# Patient Record
Sex: Male | Born: 1960 | Race: White | Hispanic: No | Marital: Single | Smoking: Current every day smoker
Health system: Southern US, Community
[De-identification: ages and names within clinical notes are randomized; demographics above are authoritative.]

## PROBLEM LIST (undated history)

## (undated) DIAGNOSIS — J449 Chronic obstructive pulmonary disease, unspecified: Secondary | ICD-10-CM

## (undated) DIAGNOSIS — K219 Gastro-esophageal reflux disease without esophagitis: Secondary | ICD-10-CM

## (undated) DIAGNOSIS — F319 Bipolar disorder, unspecified: Secondary | ICD-10-CM

## (undated) DIAGNOSIS — E079 Disorder of thyroid, unspecified: Secondary | ICD-10-CM

## (undated) DIAGNOSIS — F32A Depression, unspecified: Secondary | ICD-10-CM

## (undated) DIAGNOSIS — E119 Type 2 diabetes mellitus without complications: Secondary | ICD-10-CM

## (undated) DIAGNOSIS — F410 Panic disorder [episodic paroxysmal anxiety] without agoraphobia: Secondary | ICD-10-CM

## (undated) DIAGNOSIS — B182 Chronic viral hepatitis C: Secondary | ICD-10-CM

## (undated) DIAGNOSIS — G8929 Other chronic pain: Secondary | ICD-10-CM

## (undated) DIAGNOSIS — M549 Dorsalgia, unspecified: Secondary | ICD-10-CM

## (undated) DIAGNOSIS — F329 Major depressive disorder, single episode, unspecified: Secondary | ICD-10-CM

## (undated) DIAGNOSIS — N400 Enlarged prostate without lower urinary tract symptoms: Secondary | ICD-10-CM

## (undated) DIAGNOSIS — I10 Essential (primary) hypertension: Secondary | ICD-10-CM

## (undated) DIAGNOSIS — E785 Hyperlipidemia, unspecified: Secondary | ICD-10-CM

## (undated) HISTORY — DX: Panic disorder (episodic paroxysmal anxiety): F41.0

## (undated) HISTORY — DX: Benign prostatic hyperplasia without lower urinary tract symptoms: N40.0

## (undated) HISTORY — PX: OTHER SURGICAL HISTORY: SHX169

## (undated) HISTORY — PX: CATARACT EXTRACTION W/ INTRAOCULAR LENS IMPLANT: SHX1309

## (undated) HISTORY — DX: Essential (primary) hypertension: I10

## (undated) HISTORY — DX: Type 2 diabetes mellitus without complications: E11.9

## (undated) HISTORY — DX: Major depressive disorder, single episode, unspecified: F32.9

## (undated) HISTORY — PX: SCROTAL SURGERY: SHX2387

## (undated) HISTORY — DX: Hyperlipidemia, unspecified: E78.5

## (undated) HISTORY — DX: Gastro-esophageal reflux disease without esophagitis: K21.9

## (undated) HISTORY — DX: Chronic viral hepatitis C: B18.2

## (undated) HISTORY — PX: FACIAL RECONSTRUCTION SURGERY: SHX631

## (undated) HISTORY — DX: Depression, unspecified: F32.A

## (undated) HISTORY — DX: Chronic obstructive pulmonary disease, unspecified: J44.9

## (undated) HISTORY — DX: Disorder of thyroid, unspecified: E07.9

## (undated) HISTORY — PX: HERNIA REPAIR: SHX51

---

## 2016-09-27 ENCOUNTER — Encounter (INDEPENDENT_AMBULATORY_CARE_PROVIDER_SITE_OTHER): Payer: Self-pay

## 2016-09-27 ENCOUNTER — Ambulatory Visit (INDEPENDENT_AMBULATORY_CARE_PROVIDER_SITE_OTHER): Payer: Medicare Other | Admitting: Family Medicine

## 2016-09-27 ENCOUNTER — Encounter: Payer: Self-pay | Admitting: Family Medicine

## 2016-09-27 VITALS — BP 111/75 | HR 71 | Temp 97.0°F | Ht 72.0 in | Wt 216.0 lb

## 2016-09-27 DIAGNOSIS — F319 Bipolar disorder, unspecified: Secondary | ICD-10-CM | POA: Insufficient documentation

## 2016-09-27 DIAGNOSIS — E1159 Type 2 diabetes mellitus with other circulatory complications: Secondary | ICD-10-CM | POA: Insufficient documentation

## 2016-09-27 DIAGNOSIS — N400 Enlarged prostate without lower urinary tract symptoms: Secondary | ICD-10-CM | POA: Insufficient documentation

## 2016-09-27 DIAGNOSIS — Z794 Long term (current) use of insulin: Secondary | ICD-10-CM

## 2016-09-27 DIAGNOSIS — Z7689 Persons encountering health services in other specified circumstances: Secondary | ICD-10-CM

## 2016-09-27 DIAGNOSIS — F3177 Bipolar disorder, in partial remission, most recent episode mixed: Secondary | ICD-10-CM | POA: Diagnosis not present

## 2016-09-27 DIAGNOSIS — N401 Enlarged prostate with lower urinary tract symptoms: Secondary | ICD-10-CM | POA: Diagnosis not present

## 2016-09-27 DIAGNOSIS — R351 Nocturia: Secondary | ICD-10-CM

## 2016-09-27 DIAGNOSIS — E039 Hypothyroidism, unspecified: Secondary | ICD-10-CM | POA: Insufficient documentation

## 2016-09-27 DIAGNOSIS — F410 Panic disorder [episodic paroxysmal anxiety] without agoraphobia: Secondary | ICD-10-CM | POA: Diagnosis not present

## 2016-09-27 DIAGNOSIS — E118 Type 2 diabetes mellitus with unspecified complications: Secondary | ICD-10-CM

## 2016-09-27 DIAGNOSIS — I10 Essential (primary) hypertension: Secondary | ICD-10-CM

## 2016-09-27 DIAGNOSIS — E1140 Type 2 diabetes mellitus with diabetic neuropathy, unspecified: Secondary | ICD-10-CM

## 2016-09-27 LAB — BAYER DCA HB A1C WAIVED: HB A1C (BAYER DCA - WAIVED): 8.7 % — ABNORMAL HIGH (ref <7.0)

## 2016-09-27 MED ORDER — BLOOD GLUCOSE MONITORING SUPPL W/DEVICE KIT: PACK | 1 refills | Status: DC

## 2016-09-27 MED ORDER — GABAPENTIN 300 MG PO CAPS: 300.0000 mg | ORAL_CAPSULE | Freq: Two times a day (BID) | ORAL | 1 refills | Status: DC

## 2016-09-27 MED ORDER — ALPRAZOLAM 0.5 MG PO TABS: 0.5000 mg | ORAL_TABLET | Freq: Every day | ORAL | 0 refills | Status: DC | PRN

## 2016-09-27 MED ORDER — METFORMIN HCL 1000 MG PO TABS: 1000.0000 mg | ORAL_TABLET | Freq: Two times a day (BID) | ORAL | 1 refills | Status: DC

## 2016-09-27 MED ORDER — MIRTAZAPINE 45 MG PO TABS
45.0000 mg | ORAL_TABLET | Freq: Every day | ORAL | 1 refills | Status: DC
Start: 2016-09-27 — End: 2016-11-26

## 2016-09-27 MED ORDER — INSULIN NPH (HUMAN) (ISOPHANE) 100 UNIT/ML ~~LOC~~ SUSP: 25.0000 [IU] | Freq: Two times a day (BID) | SUBCUTANEOUS | 11 refills | Status: DC

## 2016-09-27 MED ORDER — "INSULIN SYRINGE 29G X 1/2"" 1 ML MISC": 11 refills | Status: DC

## 2016-09-27 MED ORDER — LEVOTHYROXINE SODIUM 50 MCG PO TABS: 50.0000 ug | ORAL_TABLET | Freq: Every day | ORAL | 1 refills | Status: DC

## 2016-09-27 MED ORDER — FLUOXETINE HCL 40 MG PO CAPS: 40.0000 mg | ORAL_CAPSULE | Freq: Every day | ORAL | 1 refills | Status: DC

## 2016-09-27 MED ORDER — TOPIRAMATE 200 MG PO TABS: 200.0000 mg | ORAL_TABLET | Freq: Every day | ORAL | 1 refills | Status: DC

## 2016-09-27 MED ORDER — GEMFIBROZIL 600 MG PO TABS: 600.0000 mg | ORAL_TABLET | Freq: Two times a day (BID) | ORAL | 1 refills | Status: DC

## 2016-09-27 MED ORDER — LOSARTAN POTASSIUM 50 MG PO TABS: 50.0000 mg | ORAL_TABLET | Freq: Every day | ORAL | 3 refills | Status: DC

## 2016-09-27 MED ORDER — INSULIN REGULAR HUMAN 100 UNIT/ML IJ SOLN: 0.0000 [IU] | Freq: Two times a day (BID) | INTRAMUSCULAR | 11 refills | Status: DC

## 2016-09-27 NOTE — Progress Notes (Signed)
Subjective:    Patient ID: Jacob Bautista, male    DOB: August 13, 1961, 56 y.o.   MRN: [id number]  HPI Pt here today to establish care and have medications refilled. He was recently released from being incarcerated.  Patient is living with his daughter and doing pretty well. He has type 2 diabetes, hypertension, hypothyroid, bipolar disorder, PTSD, BPH. He also has some issues with panic disorder usually precipitated by being in groups and large crowds. Does complain of some cough, nonproductive. Also complains of s I think he is generally doing pretty well. He does have some insight as to his problems and that makes a difference ome burning and pain in his feet. Was formally treated with gabapentin but that was discontinued while he was imprisoned.   There are no active problems to display for this patient.  Outpatient Encounter Prescriptions as of 09/27/2016  Medication Sig  . FLUoxetine (PROZAC) 40 MG capsule Take 40 mg by mouth daily.  [name] gemfibrozil (LOPID) 600 MG tablet Take 600 mg by mouth 2 (two) times daily before a meal.  . glipiZIDE (GLUCOTROL) 10 MG tablet Take 10 mg by mouth 2 (two) times daily before a meal.  . insulin NPH Human (HUMULIN N,NOVOLIN N) 100 UNIT/ML injection Inject 25 Units into the skin 2 (two) times daily before a meal.  . insulin regular (NOVOLIN R,HUMULIN R) 250 units/2.68m (100 units/mL) injection Inject into the skin 2 (two) times daily before a meal. Sliding scale  . levothyroxine (SYNTHROID, LEVOTHROID) 50 MCG tablet Take 50 mcg by mouth daily before breakfast.  . lisinopril (PRINIVIL,ZESTRIL) 5 MG tablet Take 5 mg by mouth daily.  . meloxicam (MOBIC) 7.5 MG tablet Take 7.5 mg by mouth 2 (two) times daily.  . metFORMIN (GLUCOPHAGE) 1000 MG tablet Take 1,000 mg by mouth 2 (two) times daily with a meal.  . mirtazapine (REMERON) 45 MG tablet Take 45 mg by mouth at bedtime.  . topiramate (TOPAMAX) 200 MG tablet Take 200 mg by mouth daily.   No  facility-administered encounter medications on file as of 09/27/2016.       Review of Systems  Constitutional: Negative.   HENT: Negative.   Eyes: Negative.   Respiratory: Positive for cough.   Cardiovascular: Negative.   Gastrointestinal: Positive for diarrhea and nausea.  Endocrine: Negative.   Genitourinary: Negative.   Musculoskeletal: Positive for arthralgias (right hip pain).  Skin: Negative.   Allergic/Immunologic: Negative.   Neurological: Negative.   Hematological: Negative.   Psychiatric/Behavioral: Positive for agitation. The patient is nervous/anxious.        Objective:   Physical Exam  Constitutional: He is oriented to person, place, and time. He appears well-developed and well-nourished.  HENT:  Mouth/Throat: Oropharynx is clear and moist.  Eyes: Pupils are equal, round, and reactive to light.  Cardiovascular: Normal rate, regular rhythm, normal heart sounds and intact distal pulses.   Pulmonary/Chest: Effort normal. He has wheezes.  Abdominal: Soft. There is no tenderness.  Neurological: He is alert and oriented to person, place, and time.  Psychiatric: He has a normal mood and affect. His behavior is normal. Thought content normal.    BP 111/75 (BP Location: Left Arm)   Pulse 71   Temp 97 F (36.1 C) (Oral)   Ht 6' (1.829 m)   Wt 216 lb (98 kg)   BMI 29.29 kg/m        Assessment & Plan:  1. Encounter to establish care We'll do some baseline labs and will  make some small changes and additions to medicine - POCT glycosylated hemoglobin (Hb A1C) - CMP14+EGFR - Lipid panel  2. Type 2 diabetes mellitus with complication, with long-term current use of insulin (HCC) Stop glipizide since he has some lows. We discussed sliding scale, importance of diet. Last A1c was 7.5 - POCT glycosylated hemoglobin (Hb A1C) - INSULIN SYRINGE 1CC/29G (B-D INSULIN SYRINGE) 29G X 1/2" 1 ML MISC; Use with insulin up to QID PRN  Dispense: 300 each; Refill: 11 - Hgb A1c w/o  eAG  3. Essential hypertension Change lisinopril to losartan. Patient is having cough which may be related - CMP14+EGFR  4. Controlled type 2 diabetes with neuropathy ([place]) Continue with NPH, regular insulin, and metformin. Discontinued glipizide due to hypoglycemia. And gabapentin 300 mg twice a day for diabetic neuropathy    6. Hypothyroidism, unspecified type Last thyroid panel was normal. Continue same dose of replacement  7. Bipolar disorder, in partial remission, most recent episode mixed ([place]) Taking mood stabilizer and antidepressant. Problem seems to be in remission  8. Benign prostatic hyperplasia with nocturia Does not want any medicine. Problem is minor at this time. Last PSA done 2 years ago was 0.4  9. Panic disordser Add alprazolam 0.5 mg #30. I believe patient can manage his symptoms taking this when necessary and he agrees  Wardell Honour MD

## 2016-09-27 NOTE — Addendum Note (Signed)
Addended by: Magdalene RiverBULLINS, Mickie Kozikowski H on: 09/27/2016 05:08 PM   Modules accepted: Orders

## 2016-09-28 LAB — LIPID PANEL
Chol/HDL Ratio: 5.2 ratio units — ABNORMAL HIGH (ref 0.0–5.0)
Cholesterol, Total: 165 mg/dL (ref 100–199)
HDL: 32 mg/dL — AB (ref >39)
LDL Calculated: 89 mg/dL (ref 0–99)
Triglycerides: 218 mg/dL — ABNORMAL HIGH (ref 0–149)
VLDL CHOLESTEROL CAL: 44 mg/dL — AB (ref 5–40)

## 2016-09-28 LAB — CMP14+EGFR
A/G RATIO: 1.8 (ref 1.2–2.2)
ALK PHOS: 71 IU/L (ref 39–117)
ALT: 26 IU/L (ref 0–44)
AST: 32 IU/L (ref 0–40)
Albumin: 4.8 g/dL (ref 3.5–5.5)
BILIRUBIN TOTAL: 0.7 mg/dL (ref 0.0–1.2)
BUN/Creatinine Ratio: 18 (ref 9–20)
BUN: 17 mg/dL (ref 6–24)
CHLORIDE: 97 mmol/L (ref 96–106)
CO2: 20 mmol/L (ref 18–29)
Calcium: 9.9 mg/dL (ref 8.7–10.2)
Creatinine, Ser: 0.94 mg/dL (ref 0.76–1.27)
GFR calc non Af Amer: 91 mL/min/{1.73_m2} (ref >59)
GFR, EST AFRICAN AMERICAN: 105 mL/min/{1.73_m2} (ref >59)
GLUCOSE: 275 mg/dL — AB (ref 65–99)
Globulin, Total: 2.7 g/dL (ref 1.5–4.5)
POTASSIUM: 4.5 mmol/L (ref 3.5–5.2)
Sodium: 135 mmol/L (ref 134–144)
TOTAL PROTEIN: 7.5 g/dL (ref 6.0–8.5)

## 2016-10-13 ENCOUNTER — Emergency Department (HOSPITAL_COMMUNITY): Payer: Medicare Other

## 2016-10-13 ENCOUNTER — Inpatient Hospital Stay (HOSPITAL_COMMUNITY)
Admission: EM | Admit: 2016-10-13 | Discharge: 2016-10-15 | DRG: 683 | Disposition: A | Discharge status: Home / Self Care | Payer: Medicare Other | Attending: Internal Medicine | Admitting: Internal Medicine

## 2016-10-13 ENCOUNTER — Encounter (HOSPITAL_COMMUNITY): Payer: Self-pay

## 2016-10-13 DIAGNOSIS — F17210 Nicotine dependence, cigarettes, uncomplicated: Secondary | ICD-10-CM | POA: Diagnosis present

## 2016-10-13 DIAGNOSIS — E1159 Type 2 diabetes mellitus with other circulatory complications: Secondary | ICD-10-CM | POA: Diagnosis present

## 2016-10-13 DIAGNOSIS — F1210 Cannabis abuse, uncomplicated: Secondary | ICD-10-CM | POA: Diagnosis present

## 2016-10-13 DIAGNOSIS — Z8249 Family history of ischemic heart disease and other diseases of the circulatory system: Secondary | ICD-10-CM

## 2016-10-13 DIAGNOSIS — E1165 Type 2 diabetes mellitus with hyperglycemia: Secondary | ICD-10-CM

## 2016-10-13 DIAGNOSIS — E872 Acidosis, unspecified: Secondary | ICD-10-CM

## 2016-10-13 DIAGNOSIS — R32 Unspecified urinary incontinence: Secondary | ICD-10-CM | POA: Diagnosis present

## 2016-10-13 DIAGNOSIS — F410 Panic disorder [episodic paroxysmal anxiety] without agoraphobia: Secondary | ICD-10-CM | POA: Diagnosis present

## 2016-10-13 DIAGNOSIS — E869 Volume depletion, unspecified: Secondary | ICD-10-CM | POA: Diagnosis present

## 2016-10-13 DIAGNOSIS — N179 Acute kidney failure, unspecified: Secondary | ICD-10-CM | POA: Diagnosis not present

## 2016-10-13 DIAGNOSIS — R55 Syncope and collapse: Secondary | ICD-10-CM | POA: Diagnosis not present

## 2016-10-13 DIAGNOSIS — Z79899 Other long term (current) drug therapy: Secondary | ICD-10-CM

## 2016-10-13 DIAGNOSIS — Z794 Long term (current) use of insulin: Secondary | ICD-10-CM

## 2016-10-13 DIAGNOSIS — R81 Glycosuria: Secondary | ICD-10-CM | POA: Diagnosis present

## 2016-10-13 DIAGNOSIS — Z7982 Long term (current) use of aspirin: Secondary | ICD-10-CM

## 2016-10-13 DIAGNOSIS — I10 Essential (primary) hypertension: Secondary | ICD-10-CM | POA: Diagnosis present

## 2016-10-13 DIAGNOSIS — F319 Bipolar disorder, unspecified: Secondary | ICD-10-CM | POA: Diagnosis present

## 2016-10-13 DIAGNOSIS — F4310 Post-traumatic stress disorder, unspecified: Secondary | ICD-10-CM | POA: Diagnosis present

## 2016-10-13 DIAGNOSIS — N400 Enlarged prostate without lower urinary tract symptoms: Secondary | ICD-10-CM | POA: Diagnosis present

## 2016-10-13 DIAGNOSIS — Z833 Family history of diabetes mellitus: Secondary | ICD-10-CM

## 2016-10-13 DIAGNOSIS — I959 Hypotension, unspecified: Secondary | ICD-10-CM | POA: Diagnosis present

## 2016-10-13 DIAGNOSIS — Z9111 Patient's noncompliance with dietary regimen: Secondary | ICD-10-CM

## 2016-10-13 DIAGNOSIS — Z9114 Patient's other noncompliance with medication regimen: Secondary | ICD-10-CM

## 2016-10-13 DIAGNOSIS — E039 Hypothyroidism, unspecified: Secondary | ICD-10-CM | POA: Diagnosis present

## 2016-10-13 DIAGNOSIS — E1140 Type 2 diabetes mellitus with diabetic neuropathy, unspecified: Secondary | ICD-10-CM | POA: Diagnosis present

## 2016-10-13 LAB — COMPREHENSIVE METABOLIC PANEL
ALT: 23 U/L (ref 17–63)
AST: 37 U/L (ref 15–41)
Albumin: 3.8 g/dL (ref 3.5–5.0)
Alkaline Phosphatase: 60 U/L (ref 38–126)
Anion gap: 14 (ref 5–15)
BUN: 20 mg/dL (ref 6–20)
CHLORIDE: 99 mmol/L — AB (ref 101–111)
CO2: 24 mmol/L (ref 22–32)
CREATININE: 1.81 mg/dL — AB (ref 0.61–1.24)
Calcium: 9.7 mg/dL (ref 8.9–10.3)
GFR calc non Af Amer: 40 mL/min — ABNORMAL LOW (ref >60)
GFR, EST AFRICAN AMERICAN: 47 mL/min — AB (ref >60)
Glucose, Bld: 273 mg/dL — ABNORMAL HIGH (ref 65–99)
Potassium: 3.3 mmol/L — ABNORMAL LOW (ref 3.5–5.1)
SODIUM: 137 mmol/L (ref 135–145)
Total Bilirubin: 0.7 mg/dL (ref 0.3–1.2)
Total Protein: 6.9 g/dL (ref 6.5–8.1)

## 2016-10-13 LAB — CBC WITH DIFFERENTIAL/PLATELET
BASOS ABS: 0 10*3/uL (ref 0.0–0.1)
BASOS PCT: 0 %
EOS ABS: 0.3 10*3/uL (ref 0.0–0.7)
EOS PCT: 3 %
HCT: 38.8 % — ABNORMAL LOW (ref 39.0–52.0)
Hemoglobin: 13.5 g/dL (ref 13.0–17.0)
Lymphocytes Relative: 36 %
Lymphs Abs: 3.2 10*3/uL (ref 0.7–4.0)
MCH: 31.7 pg (ref 26.0–34.0)
MCHC: 34.8 g/dL (ref 30.0–36.0)
MCV: 91.1 fL (ref 78.0–100.0)
Monocytes Absolute: 0.7 10*3/uL (ref 0.1–1.0)
Monocytes Relative: 8 %
Neutro Abs: 4.9 10*3/uL (ref 1.7–7.7)
Neutrophils Relative %: 53 %
PLATELETS: 231 10*3/uL (ref 150–400)
RBC: 4.26 MIL/uL (ref 4.22–5.81)
RDW: 13.1 % (ref 11.5–15.5)
WBC: 9 10*3/uL (ref 4.0–10.5)

## 2016-10-13 LAB — I-STAT CHEM 8, ED
BUN: 18 mg/dL (ref 6–20)
CHLORIDE: 97 mmol/L — AB (ref 101–111)
Calcium, Ion: 1.18 mmol/L (ref 1.15–1.40)
Creatinine, Ser: 1.7 mg/dL — ABNORMAL HIGH (ref 0.61–1.24)
GLUCOSE: 269 mg/dL — AB (ref 65–99)
HCT: 40 % (ref 39.0–52.0)
Hemoglobin: 13.6 g/dL (ref 13.0–17.0)
POTASSIUM: 3.3 mmol/L — AB (ref 3.5–5.1)
Sodium: 137 mmol/L (ref 135–145)
TCO2: 25 mmol/L (ref 0–100)

## 2016-10-13 LAB — I-STAT TROPONIN, ED: Troponin i, poc: 0.01 ng/mL (ref 0.00–0.08)

## 2016-10-13 MED ORDER — SODIUM CHLORIDE 0.9 % IV BOLUS (SEPSIS)
1000.0000 mL | Freq: Once | INTRAVENOUS | Status: AC
  Administered 2016-10-13: 1000 mL via INTRAVENOUS

## 2016-10-13 MED ORDER — IOPAMIDOL (ISOVUE-370) INJECTION 76%
75.0000 mL | Freq: Once | INTRAVENOUS | Status: AC | PRN
  Administered 2016-10-14: 75 mL via INTRAVENOUS

## 2016-10-13 NOTE — ED Notes (Signed)
Two attempts at second iv by previous RN without success, two attempts by this Rn without success for second iv as well,

## 2016-10-13 NOTE — ED Notes (Signed)
Pt and family updated on plan of care,  

## 2016-10-13 NOTE — ED Notes (Signed)
Pt room air pulse ox 88-89 %, pt placed on oxygen at 2 lpm via [place] with improvement of pulse ox to 92-93%,

## 2016-10-13 NOTE — ED Provider Notes (Signed)
[facility] DEPT Provider Note   CSN: [id number] Arrival date & time: 10/13/16  2145  By signing my name below, I, Higinio Plan, attest that this documentation has been prepared under the direction and in the presence of Forde Dandy, MD . Electronically Signed: Higinio Plan, Scribe. 10/13/2016. 10:30 PM.  History   Chief Complaint Chief Complaint  Patient presents with  . Altered Mental Status   The history is provided by the patient and a relative. No language interpreter was used.   HPI Comments: Dewell Monnier. Brabec is a 56 y.o. male with PMHx of DM, COPD, HTN, and BPH, who presents to the Emergency Department for an evaluation s/p 2 syncopal episodes that occurred this evening. Pt reports he was sitting on the couch during his first episode and then on the commode during his second episode in which he fell off the commode, causing him to strike his head. Per daughter, pt lost consciousness for ~5 minutes during both episodes and was "confused for a couple of minutes after he awoke." She denies any seizure-like activity and states he is at his baseline now in the ED  . Pt's daughter states pt recently visited his PCP on 09/27/16 in which his medications were changed, but he cannot recall which medications were modified at this time. He notes he is compliant with all of his medications. He denies any abdominal pain, abdominal distention, vomiting, diarrhea, fever, cough, congestion, rhinorrhea, dysuria, urinary frequency, blood in his stool, melena, decreased appetite, chest pain, and shortness of breath.   Past Medical History:  Diagnosis Date  . BPH (benign prostatic hyperplasia)   . Depression    PTSD  . Diabetes mellitus without complication ([place])   . Hypertension   . Thyroid disease     Patient Active Problem List   Diagnosis Date Noted  . Controlled type 2 diabetes with neuropathy ([place]) 09/27/2016  . Essential hypertension, benign 09/27/2016  . Hypothyroidism 09/27/2016  . Bipolar  disorder ([place]) 09/27/2016  . BPH (benign prostatic hyperplasia) 09/27/2016  . Panic disorder 09/27/2016    Past Surgical History:  Procedure Laterality Date  . FACIAL RECONSTRUCTION SURGERY    . HERNIA REPAIR     x 2  . left hand surg    . SCROTAL SURGERY      Home Medications    Prior to Admission medications   Medication Sig Start Date End Date Taking? Authorizing Provider  albuterol (PROVENTIL HFA;VENTOLIN HFA) 108 (90 Base) MCG/ACT inhaler Inhale 1-2 puffs into the lungs every 6 (six) hours as needed for wheezing or shortness of breath.   Yes Historical Provider, MD  ALPRAZolam Duanne Moron) 0.5 MG tablet Take 1 tablet (0.5 mg total) by mouth daily as needed for anxiety. 09/27/16  Yes Wardell Honour, MD  aspirin EC 81 MG tablet Take 81 mg by mouth daily.   Yes Historical Provider, MD  FLUoxetine (PROZAC) 40 MG capsule Take 1 capsule (40 mg total) by mouth daily. 09/27/16  Yes Wardell Honour, MD  gabapentin (NEURONTIN) 300 MG capsule Take 1 capsule (300 mg total) by mouth 2 (two) times daily. 09/27/16  Yes Wardell Honour, MD  gemfibrozil (LOPID) 600 MG tablet Take 1 tablet (600 mg total) by mouth 2 (two) times daily before a meal. 09/27/16  Yes Wardell Honour, MD  insulin NPH Human (HUMULIN N,NOVOLIN N) 100 UNIT/ML injection Inject 0.25 mLs (25 Units total) into the skin 2 (two) times daily before a meal. 09/27/16  Yes Lillette Boxer  Sabra Heck, MD  insulin regular (NOVOLIN R,HUMULIN R) 250 units/2.74m (100 units/mL) injection Inject 0-0.1 mLs (0-10 Units total) into the skin 2 (two) times daily before a meal. Sliding scale 09/27/16  Yes SWardell Honour MD  levothyroxine (SYNTHROID, LEVOTHROID) 50 MCG tablet Take 1 tablet (50 mcg total) by mouth daily before breakfast. 09/27/16  Yes SWardell Honour MD  losartan (COZAAR) 50 MG tablet Take 1 tablet (50 mg total) by mouth daily. 09/27/16  Yes SWardell Honour MD  metFORMIN (GLUCOPHAGE) 1000 MG tablet Take 1 tablet (1,000 mg total) by mouth 2 (two)  times daily with a meal. 09/27/16  Yes SWardell Honour MD  mirtazapine (REMERON) 45 MG tablet Take 1 tablet (45 mg total) by mouth at bedtime. 09/27/16  Yes SWardell Honour MD  Blood Glucose Monitoring Suppl w/Device KIT Test BS BID and PRN E11.9 09/27/16   SWardell Honour MD  INSULIN SYRINGE 1CC/29G (B-D INSULIN SYRINGE) 29G X 1/2" 1 ML MISC Use with insulin up to QID PRN 09/27/16   SWardell Honour MD  topiramate (TOPAMAX) 200 MG tablet Take 1 tablet (200 mg total) by mouth daily. Patient not taking: Reported on 10/13/2016 09/27/16   SWardell Honour MD    Family History Family History  Problem Relation Age of Onset  . Heart disease Father   . Diabetes Father   . Hypertension Father   . Stroke Father   . Stroke Brother     Social History Social History  Substance Use Topics  . Smoking status: Current Every Day Smoker    Types: Cigarettes  . Smokeless tobacco: Current User  . Alcohol use Yes     Comment: social   Allergies   Patient has no known allergies.  Review of Systems Review of Systems  Constitutional: Negative for appetite change and fever.  HENT: Negative for congestion.   Respiratory: Negative for shortness of breath.   Cardiovascular: Negative for chest pain.  Gastrointestinal: Negative for abdominal distention, abdominal pain, blood in stool, diarrhea, nausea and vomiting.  Genitourinary: Negative for dysuria and frequency.  All other systems reviewed and are negative.  Physical Exam Updated Vital Signs BP (!) 77/51 (BP Location: Left Arm)   Pulse 74   Temp 97.6 F (36.4 C) (Oral)   Resp 16   Ht 6' (1.829 m)   Wt 217 lb (98.4 kg)   SpO2 94%   BMI 29.43 kg/m   Physical Exam Physical Exam  Nursing note and vitals reviewed. Constitutional: appears pale, low energy, fatigued Head: Normocephalic and atraumatic.  Mouth/Throat: Oropharynx is clear and dry mucous membranes.  Neck: Normal range of motion. Neck supple.  Cardiovascular: Normal rate and  regular rhythm. No edema. +2 symmetric DP and radial pulses bilaterally  Pulmonary/Chest: Effort normal and breath sounds normal.  Abdominal: Soft. There is no tenderness. There is no rebound and no guarding.  Musculoskeletal: Normal range of motion.  Neurological: Alert, no facial droop, fluent speech, moves all extremities symmetrically Skin: Skin is warm and dry.  Psychiatric: Cooperative  ED Treatments / Results  DIAGNOSTIC STUDIES:  Oxygen Saturation is 94% on RA, normal by my interpretation.    COORDINATION OF CARE:  10:10 PM Discussed treatment plan with pt and his daughter at bedside and they agreed to plan.  Labs (all labs ordered are listed, but only abnormal results are displayed) Labs Reviewed  CBC WITH DIFFERENTIAL/PLATELET - Abnormal; Notable for the following:       Result Value  HCT 38.8 (*)    All other components within normal limits  COMPREHENSIVE METABOLIC PANEL - Abnormal; Notable for the following:    Potassium 3.3 (*)    Chloride 99 (*)    Glucose, Bld 273 (*)    Creatinine, Ser 1.81 (*)    GFR calc non Af Amer 40 (*)    GFR calc Af Amer 47 (*)    All other components within normal limits  I-STAT CHEM 8, ED - Abnormal; Notable for the following:    Potassium 3.3 (*)    Chloride 97 (*)    Creatinine, Ser 1.70 (*)    Glucose, Bld 269 (*)    All other components within normal limits  CULTURE, BLOOD (ROUTINE X 2)  CULTURE, BLOOD (ROUTINE X 2)  URINE CULTURE  URINALYSIS, ROUTINE W REFLEX MICROSCOPIC  LACTIC ACID, PLASMA  LACTIC ACID, PLASMA  CORTISOL  I-STAT TROPOININ, ED    EKG  EKG Interpretation  Date/Time:  Sunday October 13 2016 21:55:26 EST Ventricular Rate:  75 PR Interval:    QRS Duration: 127 QT Interval:  459 QTC Calculation: 513 R Axis:   68 Text Interpretation:  Sinus rhythm Nonspecific intraventricular conduction delay no prior EKG  Confirmed by LIU MD, DANA [phone]) on 10/13/2016 10:33:57 PM       Radiology Dg Chest  Portable 1 View  Result Date: 10/13/2016 CLINICAL DATA:  Two syncopal episodes today, fell off toilet and hit head. History of hypertension and diabetes. EXAM: PORTABLE CHEST 1 VIEW COMPARISON:  None. FINDINGS: Cardiac silhouette is mildly enlarged, silhouette is unremarkable for this low inspiratory examination with crowded vasculature markings. The lungs are clear without pleural effusions or focal consolidations. Trachea projects midline and there is no pneumothorax. Included soft tissue planes and osseous structures are non-suspicious. Possible loose body projecting over LEFT clavicle. IMPRESSION: Mild cardiomegaly, no acute pulmonary process in this low inspiratory portable examination. Electronically Signed   By: Elon Alas M.D.   On: 10/13/2016 22:28    Procedures Procedures (including critical care time) CRITICAL CARE Performed by: Forde Dandy   Total critical care time: 45 minutes  Critical care time was exclusive of separately billable procedures and treating other patients.  Critical care was necessary to treat or prevent imminent or life-threatening deterioration.  Critical care was time spent personally by me on the following activities: development of treatment plan with patient and/or surrogate as well as nursing, discussions with consultants, evaluation of patient's response to treatment, examination of patient, obtaining history from patient or surrogate, ordering and performing treatments and interventions, ordering and review of laboratory studies, ordering and review of radiographic studies, pulse oximetry and re-evaluation of patient's condition.  Medications Ordered in ED Medications  iopamidol (ISOVUE-370) 76 % injection 75 mL (not administered)  0.9 %  sodium chloride infusion (not administered)  sodium chloride 0.9 % bolus 1,000 mL (0 mLs Intravenous Stopped 10/13/16 2318)  sodium chloride 0.9 % bolus 1,000 mL (0 mLs Intravenous Stopped 10/13/16 2318)  sodium  chloride 0.9 % bolus 1,000 mL (1,000 mLs Intravenous New Bag/Given 10/13/16 2319)    Initial Impression / Assessment and Plan / ED Course  I have reviewed the triage vital signs and the nursing notes.  Pertinent labs & imaging results that were available during my care of the patient were reviewed by me and considered in my medical decision making (see chart for details).     Presenting with 2 syncopal episodes, and on arrival is hypotensive with SBP  70s. Afebrile, HR 70s. With 2 L oxygen requirement.   No history of GI bleed. No CCB or BB on medication list. No infectious symptoms. EKG with ischemia or infarction. No history of GI losses or history suggestive of dehydration. Performed CT PE given hypoxia. CT visualized and no evidence of PE. He has AKI and elevated lactate which is likely from decreased perfusion from hypotension. No signs or symptoms of infection/sepsis at this time. Considered adrenal insufficiency, but less likely as no major risk factors for this. Question autonomic dysfunction for diabetes, but diabetes well controlled.   The patient is noted to have a MAP's <65/ SBP's <90. With the current information available to me, I don't think the patient is in septic shock. The MAP's <65/ SBP's <90, is related to an acute condition that is not due to an infection, and OTHER SHOCK.    Hypotension resolving with fluids. Discussed with Dr. Marin Comment who will admit for monitoring.     I personally performed the services described in this documentation, which was scribed in my presence. The recorded information has been reviewed and is accurate.   Final Clinical Impressions(s) / ED Diagnoses   Final diagnoses:  Hypotension, unspecified hypotension type  Syncope and collapse    New Prescriptions New Prescriptions   No medications on file     Forde Dandy, MD 10/14/16 [phone]

## 2016-10-13 NOTE — ED Triage Notes (Signed)
Pt lives with daughter, per daughter patient had two LOC episodes the first around [id number] and the second around 2130. Pts daughter states LOC for about 5 mins each. The second episode happened when patient was on the toilet and he did fall off the toilet and hit his head. Pt c/o of headache, neck and bilateral shoulder pain and blurry vision. Pt not on any blood thinners

## 2016-10-14 ENCOUNTER — Encounter (HOSPITAL_COMMUNITY): Payer: Self-pay | Admitting: Internal Medicine

## 2016-10-14 ENCOUNTER — Inpatient Hospital Stay (HOSPITAL_COMMUNITY): Payer: Medicare Other

## 2016-10-14 DIAGNOSIS — Z9114 Patient's other noncompliance with medication regimen: Secondary | ICD-10-CM | POA: Diagnosis not present

## 2016-10-14 DIAGNOSIS — F319 Bipolar disorder, unspecified: Secondary | ICD-10-CM | POA: Diagnosis present

## 2016-10-14 DIAGNOSIS — F410 Panic disorder [episodic paroxysmal anxiety] without agoraphobia: Secondary | ICD-10-CM | POA: Diagnosis present

## 2016-10-14 DIAGNOSIS — E039 Hypothyroidism, unspecified: Secondary | ICD-10-CM | POA: Diagnosis present

## 2016-10-14 DIAGNOSIS — Z8249 Family history of ischemic heart disease and other diseases of the circulatory system: Secondary | ICD-10-CM | POA: Diagnosis not present

## 2016-10-14 DIAGNOSIS — N179 Acute kidney failure, unspecified: Secondary | ICD-10-CM

## 2016-10-14 DIAGNOSIS — R81 Glycosuria: Secondary | ICD-10-CM | POA: Diagnosis present

## 2016-10-14 DIAGNOSIS — E872 Acidosis, unspecified: Secondary | ICD-10-CM

## 2016-10-14 DIAGNOSIS — R32 Unspecified urinary incontinence: Secondary | ICD-10-CM | POA: Diagnosis present

## 2016-10-14 DIAGNOSIS — E869 Volume depletion, unspecified: Secondary | ICD-10-CM | POA: Diagnosis present

## 2016-10-14 DIAGNOSIS — I10 Essential (primary) hypertension: Secondary | ICD-10-CM | POA: Diagnosis not present

## 2016-10-14 DIAGNOSIS — R55 Syncope and collapse: Secondary | ICD-10-CM | POA: Diagnosis not present

## 2016-10-14 DIAGNOSIS — Z7982 Long term (current) use of aspirin: Secondary | ICD-10-CM | POA: Diagnosis not present

## 2016-10-14 DIAGNOSIS — E1140 Type 2 diabetes mellitus with diabetic neuropathy, unspecified: Secondary | ICD-10-CM

## 2016-10-14 DIAGNOSIS — Z794 Long term (current) use of insulin: Secondary | ICD-10-CM

## 2016-10-14 DIAGNOSIS — N400 Enlarged prostate without lower urinary tract symptoms: Secondary | ICD-10-CM | POA: Diagnosis present

## 2016-10-14 DIAGNOSIS — Z9111 Patient's noncompliance with dietary regimen: Secondary | ICD-10-CM | POA: Diagnosis not present

## 2016-10-14 DIAGNOSIS — I959 Hypotension, unspecified: Secondary | ICD-10-CM | POA: Diagnosis not present

## 2016-10-14 DIAGNOSIS — F1210 Cannabis abuse, uncomplicated: Secondary | ICD-10-CM | POA: Diagnosis present

## 2016-10-14 DIAGNOSIS — Z833 Family history of diabetes mellitus: Secondary | ICD-10-CM | POA: Diagnosis not present

## 2016-10-14 DIAGNOSIS — Z79899 Other long term (current) drug therapy: Secondary | ICD-10-CM | POA: Diagnosis not present

## 2016-10-14 DIAGNOSIS — E1165 Type 2 diabetes mellitus with hyperglycemia: Secondary | ICD-10-CM

## 2016-10-14 DIAGNOSIS — F4310 Post-traumatic stress disorder, unspecified: Secondary | ICD-10-CM | POA: Diagnosis present

## 2016-10-14 DIAGNOSIS — F17210 Nicotine dependence, cigarettes, uncomplicated: Secondary | ICD-10-CM | POA: Diagnosis present

## 2016-10-14 LAB — LACTIC ACID, PLASMA
LACTIC ACID, VENOUS: 3.1 mmol/L — AB (ref 0.5–1.9)
Lactic Acid, Venous: 2.1 mmol/L (ref 0.5–1.9)

## 2016-10-14 LAB — CBC
HEMATOCRIT: 37.1 % — AB (ref 39.0–52.0)
Hemoglobin: 12.9 g/dL — ABNORMAL LOW (ref 13.0–17.0)
MCH: 31.9 pg (ref 26.0–34.0)
MCHC: 34.8 g/dL (ref 30.0–36.0)
MCV: 91.8 fL (ref 78.0–100.0)
Platelets: 169 10*3/uL (ref 150–400)
RBC: 4.04 MIL/uL — ABNORMAL LOW (ref 4.22–5.81)
RDW: 13.1 % (ref 11.5–15.5)
WBC: 6.4 10*3/uL (ref 4.0–10.5)

## 2016-10-14 LAB — GLUCOSE, CAPILLARY
Glucose-Capillary: 341 mg/dL — ABNORMAL HIGH (ref 65–99)
Glucose-Capillary: 206 mg/dL — ABNORMAL HIGH (ref 65–99)
Glucose-Capillary: 145 mg/dL — ABNORMAL HIGH (ref 65–99)
Glucose-Capillary: 363 mg/dL — ABNORMAL HIGH (ref 65–99)

## 2016-10-14 LAB — RAPID URINE DRUG SCREEN, HOSP PERFORMED
AMPHETAMINES: NOT DETECTED
BENZODIAZEPINES: NOT DETECTED
Barbiturates: NOT DETECTED
Cocaine: NOT DETECTED
OPIATES: NOT DETECTED
Tetrahydrocannabinol: POSITIVE — AB

## 2016-10-14 LAB — URINALYSIS, ROUTINE W REFLEX MICROSCOPIC
Bacteria, UA: NONE SEEN
Bilirubin Urine: NEGATIVE
GLUCOSE, UA: 50 mg/dL — AB
Hgb urine dipstick: NEGATIVE
Ketones, ur: NEGATIVE mg/dL
Leukocytes, UA: NEGATIVE
Nitrite: NEGATIVE
PROTEIN: 30 mg/dL — AB
SPECIFIC GRAVITY, URINE: 1.011 (ref 1.005–1.030)
pH: 5 (ref 5.0–8.0)

## 2016-10-14 LAB — TROPONIN I
Troponin I: <0.03 ng/mL (ref <0.03)
Troponin I: <0.03 ng/mL (ref <0.03)

## 2016-10-14 LAB — CORTISOL: CORTISOL PLASMA: 36.1 ug/dL

## 2016-10-14 LAB — ECHOCARDIOGRAM COMPLETE
HEIGHTINCHES: 72 in
Weight: 3640.24 oz

## 2016-10-14 LAB — BASIC METABOLIC PANEL
Anion gap: 7 (ref 5–15)
BUN: 19 mg/dL (ref 6–20)
CO2: 23 mmol/L (ref 22–32)
Calcium: 8.6 mg/dL — ABNORMAL LOW (ref 8.9–10.3)
Chloride: 105 mmol/L (ref 101–111)
Creatinine, Ser: 1.21 mg/dL (ref 0.61–1.24)
GFR calc Af Amer: >60 mL/min (ref >60)
GLUCOSE: 358 mg/dL — AB (ref 65–99)
POTASSIUM: 5 mmol/L (ref 3.5–5.1)
Sodium: 135 mmol/L (ref 135–145)

## 2016-10-14 LAB — TSH: TSH: 0.747 u[IU]/mL (ref 0.350–4.500)

## 2016-10-14 LAB — MRSA PCR SCREENING: MRSA by PCR: NEGATIVE

## 2016-10-14 LAB — CK: CK TOTAL: 72 U/L (ref 49–397)

## 2016-10-14 MED ORDER — ASPIRIN EC 81 MG PO TBEC
81.0000 mg | DELAYED_RELEASE_TABLET | Freq: Every day | ORAL | Status: DC
  Administered 2016-10-14 – 2016-10-15 (×2): 81 mg via ORAL
  Filled 2016-10-14 (×2): qty 1

## 2016-10-14 MED ORDER — SODIUM CHLORIDE 0.9% FLUSH
3.0000 mL | Freq: Two times a day (BID) | INTRAVENOUS | Status: DC
  Administered 2016-10-14 – 2016-10-15 (×3): 3 mL via INTRAVENOUS

## 2016-10-14 MED ORDER — ALBUTEROL SULFATE (2.5 MG/3ML) 0.083% IN NEBU: 3.0000 mL | INHALATION_SOLUTION | Freq: Four times a day (QID) | RESPIRATORY_TRACT | Status: DC | PRN

## 2016-10-14 MED ORDER — SODIUM CHLORIDE 0.9 % IV SOLN
Freq: Once | INTRAVENOUS | Status: AC
  Administered 2016-10-14: 01:00:00 via INTRAVENOUS

## 2016-10-14 MED ORDER — INSULIN GLARGINE 100 UNIT/ML ~~LOC~~ SOLN
10.0000 [IU] | Freq: Every day | SUBCUTANEOUS | Status: DC
  Administered 2016-10-14: 10 [IU] via SUBCUTANEOUS
  Filled 2016-10-14 (×2): qty 0.1

## 2016-10-14 MED ORDER — SODIUM CHLORIDE 0.9 % IV SOLN
INTRAVENOUS | Status: DC
  Administered 2016-10-14 – 2016-10-15 (×4): via INTRAVENOUS

## 2016-10-14 MED ORDER — MIRTAZAPINE 15 MG PO TABS
45.0000 mg | ORAL_TABLET | Freq: Every day | ORAL | Status: DC
  Administered 2016-10-14: 45 mg via ORAL
  Filled 2016-10-14: qty 1
  Filled 2016-10-14: qty 3
  Filled 2016-10-14: qty 1

## 2016-10-14 MED ORDER — GEMFIBROZIL 600 MG PO TABS
600.0000 mg | ORAL_TABLET | Freq: Two times a day (BID) | ORAL | Status: DC
  Administered 2016-10-14 – 2016-10-15 (×3): 600 mg via ORAL
  Filled 2016-10-14 (×3): qty 1

## 2016-10-14 MED ORDER — FLUOXETINE HCL 20 MG PO CAPS
40.0000 mg | ORAL_CAPSULE | Freq: Every day | ORAL | Status: DC
  Administered 2016-10-14 – 2016-10-15 (×2): 40 mg via ORAL
  Filled 2016-10-14 (×2): qty 2

## 2016-10-14 MED ORDER — ASPIRIN EC 81 MG PO TBEC: 81.0000 mg | DELAYED_RELEASE_TABLET | Freq: Every day | ORAL | Status: DC

## 2016-10-14 MED ORDER — INSULIN ASPART 100 UNIT/ML ~~LOC~~ SOLN
0.0000 [IU] | Freq: Three times a day (TID) | SUBCUTANEOUS | Status: DC
  Administered 2016-10-14: 3 [IU] via SUBCUTANEOUS
  Administered 2016-10-14: 15 [IU] via SUBCUTANEOUS
  Administered 2016-10-14 – 2016-10-15 (×2): 7 [IU] via SUBCUTANEOUS

## 2016-10-14 MED ORDER — HEPARIN SODIUM (PORCINE) 5000 UNIT/ML IJ SOLN
5000.0000 [IU] | Freq: Three times a day (TID) | INTRAMUSCULAR | Status: DC
  Administered 2016-10-14 – 2016-10-15 (×4): 5000 [IU] via SUBCUTANEOUS
  Filled 2016-10-14 (×4): qty 1

## 2016-10-14 MED ORDER — INSULIN ASPART 100 UNIT/ML ~~LOC~~ SOLN
0.0000 [IU] | Freq: Every day | SUBCUTANEOUS | Status: DC
  Administered 2016-10-14: 5 [IU] via SUBCUTANEOUS

## 2016-10-14 MED ORDER — LEVOTHYROXINE SODIUM 50 MCG PO TABS
50.0000 ug | ORAL_TABLET | Freq: Every day | ORAL | Status: DC
  Administered 2016-10-14 – 2016-10-15 (×2): 50 ug via ORAL
  Filled 2016-10-14 (×4): qty 1
  Filled 2016-10-14: qty 2

## 2016-10-14 MED ORDER — GABAPENTIN 300 MG PO CAPS
300.0000 mg | ORAL_CAPSULE | Freq: Two times a day (BID) | ORAL | Status: DC
  Administered 2016-10-14 – 2016-10-15 (×4): 300 mg via ORAL
  Filled 2016-10-14 (×4): qty 1

## 2016-10-14 MED ORDER — ALPRAZOLAM 0.5 MG PO TABS
0.5000 mg | ORAL_TABLET | Freq: Every day | ORAL | Status: DC | PRN
  Administered 2016-10-14: 0.5 mg via ORAL
  Filled 2016-10-14: qty 1

## 2016-10-14 NOTE — Progress Notes (Signed)
Inpatient Diabetes Program Recommendations  AACE/ADA: New Consensus Statement on Inpatient Glycemic Control (2015)  Target Ranges:  Prepandial:   less than 140 mg/dL      Peak postprandial:   less than 180 mg/dL (1-2 hours)      Critically ill patients:  140 - 180 mg/dL   Lab Results  Component Value Date   GLUCAP 206 (H) 10/14/2016    Review of Glycemic Control:  Results for Jacob Bautista, [name] (MRN [id number]) as of 10/14/2016 13:46  Ref. Range 10/14/2016 07:25 10/14/2016 10:52  Glucose-Capillary Latest Ref Range: 65 - 99 mg/dL 782341 (H) 956206 (H)   Diabetes history: Type 2 diabetes Outpatient Diabetes medications: NPH 25 units bid, Reg. 0-10 units tid with meals, Metformin 1000 mg bid Current orders for Inpatient glycemic control:  Lantus 10 units q HS, Novolog resistant tid with meals and HS  Inpatient Diabetes Program Recommendations:   Please consider increasing Lantus to 20 units daily. May also benefit from Novolog meal coverage 5 units tid with meals-Hold if patient eats less than 50%.     Thanks,  Beryl MeagerJenny Braxton Weisbecker, RN, BC-ADM Inpatient Diabetes Coordinator Pager [phone]

## 2016-10-14 NOTE — Care Management Note (Signed)
Case Management Note  Patient Details  Name: Kyra Leylandlbert D. Gordy LevanWalton MRN: [id number] Date of Birth: 04/12/1961  Subjective/Objective: Patient adm with syncope. From home, ind with ADL's. CM consult for medication assistance. Patient has Medicaid. Further testing pending concerning syncope and collapse.          Action/Plan: CM following for needs.    Expected Discharge Date:        10/15/2016          Expected Discharge Plan:  Home w Home Health Services  In-House Referral:  NA  Discharge planning Services  CM Consult  Post Acute Care Choice:    Choice offered to:     DME Arranged:    DME Agency:     HH Arranged:    HH Agency:     Status of Service:  In process, will continue to follow  If discussed at Long Length of Stay Meetings, dates discussed:    Additional Comments:  Canio Winokur, Chrystine OilerSharley Diane, RN 10/14/2016, 1:00 PM

## 2016-10-14 NOTE — Progress Notes (Signed)
*  PRELIMINARY RESULTS* Echocardiogram 2D Echocardiogram has been performed.  Stacey DrainWhite, Anastacia Reinecke J 10/14/2016, 9:31 AM

## 2016-10-14 NOTE — ED Notes (Signed)
CRITICAL VALUE ALERT  Critical value received:  Lactic acid 3.1  Date of notification:  10/14/16  Time of notification:  0032  Critical value read back:Yes.    Nurse who received alert:  Thornton Daleson L Donyetta Ogletree, RN  MD notified (1st page):  Joni FearsLui  Time of first page:  0033  MD notified (2nd page):  Time of second page:  Responding MD:  Joni FearsLui  Time MD responded:  [phone]

## 2016-10-14 NOTE — ED Notes (Signed)
Pt returned from ct,  

## 2016-10-14 NOTE — ED Notes (Signed)
Patient transported to CT 

## 2016-10-14 NOTE — Progress Notes (Signed)
PROGRESS NOTE  Jacob Bautista ZOX:[id number] DOB: 01/26/1961 DOA: 10/13/2016 PCP: Frederica KusterMILLER, STEPHEN M, MD  Brief History:  56 year old male with a history of diabetes mellitus, hypertension, PTSD, hypothyroidism presents with a syncopal episode on 10/13/2016. The patient states that he was initially sitting on a couch watching his grandson playing video games, anorexia and he remembered was his daughter trying to wake him up. He stated that he had urinary incontinence during that episode. Later in the day, the patient the patient was sitting on the commode, and the next day he remembered was being on the floor with his daughter trying to wake him up. He did not have any urinary or bowel incontinence with his second episode. However, he denies any prodromal symptoms or aura. The patient states that he has had dizziness and "head rush" sensation with positional changes for the past 6 months. He denies any new medications other than changes in his insulin regimen. He states that he was able to walk 6 miles approximately 2-3 days prior to this admission, he has occasional shortness of breath when he has to "walk up hill". He denies any fevers, chills, chest pain, nausea, vomiting, diarrhea, abdominal pain. He denies any dysuria or hematuria, hematochezia, melena. He continues to smoke 1 pack per day. He uses marijuana. In the emergency room, the patient was noted to be hypoxemic. CTangio of the chest was done and was negative for pulmonary embolus. In addition, the patient was noted to be hypotensive with systolic blood pressures in the 70s. As a result, the patient was admitted for further evaluation.  Assessment/Plan: Syncope and collapse -Likely secondary to volume depletion and hypotension -the patient could have a degree of dysautonomia secondary to his diabetes mellitus -Check orthostatic vital signs -Echocardiogram -No concerning dysrhythmia on telemetry -CT brain  negative -EEG -CK  Acute kidney injury -Secondary to volume depletion -Continue IV fluids-->improving -Discontinue losartan -Baseline creatinine likely around 0.9 -Presenting creatinine 1.1  Lactic acidosis/hypotension -Due to volume depletion -Follow blood cultures -Urinalysis negative for pyuria  Polysubstance abuse -THC and tobacco -UDS -cessation discussed -HIV  Hypertension -Discontinue losartan in the setting of hypotension  Diabetes mellitus type 2, poorly controlled with neuropathy -The patient has glycosuria -Hemoglobin A1c -Continue Lantus -NovoLog sliding scale -Continue gabapentin  PTSD -Continue Xanax home dose -Continue fluoxetine and topamax  Hypothyroidism -Continue Synthroid -TSH 0.747   Disposition Plan:   Home in 1-2 days  Family Communication:   No Family at bedside  Consultants:  none  Code Status:  FULL   DVT Prophylaxis:  [place] Heparin   Total time spent 35 minutes.  Greater than 50% spent face to face counseling and coordinating care.[id number] ti 0950    Procedures: As Listed in Progress Note Above  Antibiotics: None    Subjective: Patient denies fevers, chills, headache, chest pain, dyspnea, nausea, vomiting, diarrhea, abdominal pain, dysuria, hematuria, hematochezia, and melena.   Objective: Vitals:   10/14/16 0400 10/14/16 0500 10/14/16 0600 10/14/16 0725  BP: 100/68  104/67   Pulse: 68 68 69 75  Resp: 15 13 13 18   Temp:    97.9 F (36.6 C)  TempSrc:    Axillary  SpO2: 96% 97% 97% 98%  Weight:      Height:        Intake/Output Summary (Last 24 hours) at 10/14/16 0932 Last data filed at 10/14/16 0928  Gross per 24 hour  Intake  3910 ml  Output             2100 ml  Net             1810 ml   Weight change:  Exam:   General:  Pt is alert, follows commands appropriately, not in acute distress  HEENT: No icterus, No thrush, No neck mass, [place]/AT  Cardiovascular: RRR, S1/S2, no rubs, no  gallops  Respiratory:Bibasilar rales, left greater than right. No wheezing. Good air movement.  Abdomen: Soft/+BS, non tender, non distended, no guarding  Extremities: No edema, No lymphangitis, No petechiae, No rashes, no synovitis   Data Reviewed: I have personally reviewed following labs and imaging studies Basic Metabolic Panel:  Recent Labs Lab 10/13/16 2215 10/13/16 2222 10/14/16 0333  NA 137 137 135  K 3.3* 3.3* 5.0  CL 99* 97* 105  CO2 24  --  23  GLUCOSE 273* 269* 358*  BUN 20 18 19   CREATININE 1.81* 1.70* 1.21  CALCIUM 9.7  --  8.6*   Liver Function Tests:  Recent Labs Lab 10/13/16 2215  AST 37  ALT 23  ALKPHOS 60  BILITOT 0.7  PROT 6.9  ALBUMIN 3.8   No results for input(s): LIPASE, AMYLASE in the last 168 hours. No results for input(s): AMMONIA in the last 168 hours. Coagulation Profile: No results for input(s): INR, PROTIME in the last 168 hours. CBC:  Recent Labs Lab 10/13/16 2215 10/13/16 2222 10/14/16 0333  WBC 9.0  --  6.4  NEUTROABS 4.9  --   --   HGB 13.5 13.6 12.9*  HCT 38.8* 40.0 37.1*  MCV 91.1  --  91.8  PLT 231  --  169   Cardiac Enzymes:  Recent Labs Lab 10/14/16 0333  TROPONINI <0.03   BNP: Invalid input(s): POCBNP CBG:  Recent Labs Lab 10/14/16 0725  GLUCAP 341*   HbA1C: No results for input(s): HGBA1C in the last 72 hours. Urine analysis:    Component Value Date/Time   COLORURINE YELLOW 10/14/2016 0034   APPEARANCEUR CLEAR 10/14/2016 0034   LABSPEC 1.011 10/14/2016 0034   PHURINE 5.0 10/14/2016 0034   GLUCOSEU 50 (A) 10/14/2016 0034   HGBUR NEGATIVE 10/14/2016 0034   BILIRUBINUR NEGATIVE 10/14/2016 0034   KETONESUR NEGATIVE 10/14/2016 0034   PROTEINUR 30 (A) 10/14/2016 0034   NITRITE NEGATIVE 10/14/2016 0034   LEUKOCYTESUR NEGATIVE 10/14/2016 0034   Sepsis Labs: @LABRCNTIP (procalcitonin:4,lacticidven:4) ) Recent Results (from the past 240 hour(s))  Blood culture (routine x 2)     Status: None  (Preliminary result)   Collection Time: 10/13/16 11:16 PM  Result Value Ref Range Status   Specimen Description BLOOD LEFT ARM  Final   Special Requests BOTTLES DRAWN AEROBIC AND ANAEROBIC 6CC EACH  Final   Culture NO GROWTH < 12 HOURS  Final   Report Status PENDING  Incomplete  Blood culture (routine x 2)     Status: None (Preliminary result)   Collection Time: 10/13/16 11:20 PM  Result Value Ref Range Status   Specimen Description BLOOD LEFT HAND  Final   Special Requests BOTTLES DRAWN AEROBIC AND ANAEROBIC 6CC EACH  Final   Culture NO GROWTH < 12 HOURS  Final   Report Status PENDING  Incomplete  MRSA PCR Screening     Status: None   Collection Time: 10/14/16  3:30 AM  Result Value Ref Range Status   MRSA by PCR NEGATIVE NEGATIVE Final    Comment:        The  GeneXpert MRSA Assay (FDA approved for NASAL specimens only), is one component of a comprehensive MRSA colonization surveillance program. It is not intended to diagnose MRSA infection nor to guide or monitor treatment for MRSA infections.      Scheduled Meds: . aspirin EC  81 mg Oral Daily  . FLUoxetine  40 mg Oral Daily  . gabapentin  300 mg Oral BID  . gemfibrozil  600 mg Oral BID AC  . heparin  5,000 Units Subcutaneous Q8H  . insulin aspart  0-20 Units Subcutaneous TID WC  . insulin aspart  0-5 Units Subcutaneous QHS  . insulin glargine  10 Units Subcutaneous QHS  . levothyroxine  50 mcg Oral QAC breakfast  . mirtazapine  45 mg Oral QHS  . sodium chloride flush  3 mL Intravenous Q12H   Continuous Infusions: . sodium chloride 150 mL/hr at 10/14/16 1610    Procedures/Studies: Ct Head Wo Contrast  Result Date: 10/14/2016 CLINICAL DATA:  Fall with history of dizziness and headache hit posterior head EXAM: CT HEAD WITHOUT CONTRAST TECHNIQUE: Contiguous axial images were obtained from the base of the skull through the vertex without intravenous contrast. COMPARISON:  None. FINDINGS: Brain: Examination is mildly  limited by head positioning. There is no acute territorial infarction or intracranial hemorrhage. No focal mass, mass effect or midline shift. The ventricles are nonenlarged. There is mild atrophy. Vascular: No hyperdense vessels. Scattered calcifications at the carotid siphon Skull: Mastoid air cells are clear. There is no depressed skull fracture. Sinuses/Orbits: Partially visualized postsurgical changes of the anterior wall of the right maxillary sinus. No acute orbital abnormality Other: Artifact from metallic foreign body in the left parietal scalp. IMPRESSION: No definite CT evidence for acute intracranial abnormality Electronically Signed   By: Jasmine Pang M.D.   On: 10/14/2016 03:03   Ct Angio Chest Pe W And/or Wo Contrast  Result Date: 10/14/2016 CLINICAL DATA:  Syncope and hypoxia EXAM: CT ANGIOGRAPHY CHEST WITH CONTRAST TECHNIQUE: Multidetector CT imaging of the chest was performed using the standard protocol during bolus administration of intravenous contrast. Multiplanar CT image reconstructions and MIPs were obtained to evaluate the vascular anatomy. CONTRAST:  75 mL Isovue 370 intravenous COMPARISON:  Chest x-ray 10/13/2016 FINDINGS: Cardiovascular: Satisfactory opacification of the pulmonary arteries to the segmental level. No evidence of pulmonary embolism. Normal heart size. No pericardial effusion. Mild coronary artery calcifications. Non aneurysmal aorta. No dissection. Mediastinum/Nodes: No enlarged mediastinal, hilar, or axillary lymph nodes. Thyroid gland, trachea, and esophagus demonstrate no significant findings. Lungs/Pleura: Mild paraseptal emphysema within the bilateral upper lobes. No consolidation. No pleural effusion or pneumothorax. Scarring or atelectasis within the bilateral lung bases. Upper Abdomen: Fatty infiltration of the liver. No acute abnormalities. Musculoskeletal: No chest wall abnormality. No acute or significant osseous findings. Review of the MIP images confirms  the above findings. IMPRESSION: 1. No CT evidence for acute pulmonary embolus or aortic dissection 2. Mild emphysematous disease within the upper lobes. Lung fields are clear. Electronically Signed   By: Jasmine Pang M.D.   On: 10/14/2016 00:44   Dg Chest Portable 1 View  Result Date: 10/13/2016 CLINICAL DATA:  Two syncopal episodes today, fell off toilet and hit head. History of hypertension and diabetes. EXAM: PORTABLE CHEST 1 VIEW COMPARISON:  None. FINDINGS: Cardiac silhouette is mildly enlarged, silhouette is unremarkable for this low inspiratory examination with crowded vasculature markings. The lungs are clear without pleural effusions or focal consolidations. Trachea projects midline and there is no pneumothorax. Included soft tissue  planes and osseous structures are non-suspicious. Possible loose body projecting over LEFT clavicle. IMPRESSION: Mild cardiomegaly, no acute pulmonary process in this low inspiratory portable examination. Electronically Signed   By: Awilda Metro M.D.   On: 10/13/2016 22:28    Lindel Marcell, DO  [facility] Pager [phone]  If 7PM-7AM, please contact night-coverage [email or web address] Password [email or web address] 10/14/2016, 9:32 AM   LOS: 0 days

## 2016-10-14 NOTE — H&P (Addendum)
History and Physical    Jacob Bautista. Silvio Clayman KWI:[id number] DOB: 11/01/1960 DOA: 10/13/2016  PCP: Wardell Honour, MD  Patient coming from: Home.   Chief Complaint:   Syncope.   HPI: Jacob Bautista. Jacob Bautista is an 56 y.o. male with hx of DM, depression, PTSD, got out of prison after being incarcerated for 3 years, presented to the ER with episode of lightheadedness, and frank syncopes.   He had no seizure activities.  He states that while he was in prison, his BS's have been poorly controlled, and it has been in the 300's.  He also has history of orthostatic symptomology for at least 6 months to a year which he described it as a " head rush"  He did have a syncope, and hit his head.  He denied palpitation, HA, CP, or SOB.  In the ER, he was noted to be slightly hypoxic, and was given supplemental oxygen.  Because of the syncope and hypoxia, CTPA was done, but he did not have any PE.  Further work up included a K of 3.3, and Cr of 1.7.  His EKG showed normal sinus rhythm, with no acute ST T changes, and his troponin was negative. Original BP was low at 70 systolic, and he was given IVF, felt better, and BP corrected to 110.  He has not been on antidiuretics, and he drinks very rarely.  Hospitalist was asked to admit him for recurrent syncopes.   ED Course:  See above.  Rewiew of Systems:  Constitutional: Negative for malaise, fever and chills. No significant weight loss or weight gain Eyes: Negative for eye pain, redness and discharge, diplopia, visual changes, or flashes of light. ENMT: Negative for ear pain, hoarseness, nasal congestion, sinus pressure and sore throat. No headaches; tinnitus, drooling, or problem swallowing. Cardiovascular: Negative for chest pain, palpitations, diaphoresis, dyspnea and peripheral edema. ; No orthopnea, PND Respiratory: Negative for cough, hemoptysis, wheezing and stridor. No pleuritic chestpain. Gastrointestinal: Negative for diarrhea, constipation,  melena, blood in stool,  hematemesis, jaundice and rectal bleeding.    Genitourinary: Negative for frequency, dysuria, incontinence,flank pain and hematuria; Musculoskeletal: Negative for back pain and neck pain. Negative for swelling and trauma.;  Skin: . Negative for pruritus, rash, abrasions, bruising and skin lesion.; ulcerations Neuro: Negative for headache, and neck stiffness. Negative for weakness,  extremity weakness, burning feet, involuntary movement, seizure and syncope.  Psych: negative for anxiety, depression, insomnia, tearfulness, panic attacks, hallucinations, paranoia, suicidal or homicidal ideation    Past Medical History:  Diagnosis Date  . BPH (benign prostatic hyperplasia)   . Depression    PTSD  . Diabetes mellitus without complication ([place])   . Hypertension   . Thyroid disease     Past Surgical History:  Procedure Laterality Date  . FACIAL RECONSTRUCTION SURGERY    . HERNIA REPAIR     x 2  . left hand surg    . SCROTAL SURGERY       reports that he has been smoking Cigarettes.  He uses smokeless tobacco. He reports that he drinks alcohol. He reports that he does not use drugs.  No Known Allergies  Family History  Problem Relation Age of Onset  . Heart disease Father   . Diabetes Father   . Hypertension Father   . Stroke Father   . Stroke Brother      Prior to Admission medications   Medication Sig Start Date End Date Taking? Authorizing Provider  albuterol (PROVENTIL HFA;VENTOLIN HFA) 108 (90 Base)  MCG/ACT inhaler Inhale 1-2 puffs into the lungs every 6 (six) hours as needed for wheezing or shortness of breath.   Yes Historical Provider, MD  ALPRAZolam Duanne Moron) 0.5 MG tablet Take 1 tablet (0.5 mg total) by mouth daily as needed for anxiety. 09/27/16  Yes Wardell Honour, MD  aspirin EC 81 MG tablet Take 81 mg by mouth daily.   Yes Historical Provider, MD  FLUoxetine (PROZAC) 40 MG capsule Take 1 capsule (40 mg total) by mouth daily. 09/27/16  Yes Wardell Honour, MD    gabapentin (NEURONTIN) 300 MG capsule Take 1 capsule (300 mg total) by mouth 2 (two) times daily. 09/27/16  Yes Wardell Honour, MD  gemfibrozil (LOPID) 600 MG tablet Take 1 tablet (600 mg total) by mouth 2 (two) times daily before a meal. 09/27/16  Yes Wardell Honour, MD  insulin NPH Human (HUMULIN N,NOVOLIN N) 100 UNIT/ML injection Inject 0.25 mLs (25 Units total) into the skin 2 (two) times daily before a meal. 09/27/16  Yes Wardell Honour, MD  insulin regular (NOVOLIN R,HUMULIN R) 250 units/2.64m (100 units/mL) injection Inject 0-0.1 mLs (0-10 Units total) into the skin 2 (two) times daily before a meal. Sliding scale 09/27/16  Yes SWardell Honour MD  levothyroxine (SYNTHROID, LEVOTHROID) 50 MCG tablet Take 1 tablet (50 mcg total) by mouth daily before breakfast. 09/27/16  Yes SWardell Honour MD  losartan (COZAAR) 50 MG tablet Take 1 tablet (50 mg total) by mouth daily. 09/27/16  Yes SWardell Honour MD  metFORMIN (GLUCOPHAGE) 1000 MG tablet Take 1 tablet (1,000 mg total) by mouth 2 (two) times daily with a meal. 09/27/16  Yes SWardell Honour MD  mirtazapine (REMERON) 45 MG tablet Take 1 tablet (45 mg total) by mouth at bedtime. 09/27/16  Yes SWardell Honour MD  Blood Glucose Monitoring Suppl w/Device KIT Test BS BID and PRN E11.9 09/27/16   SWardell Honour MD  INSULIN SYRINGE 1CC/29G (B-D INSULIN SYRINGE) 29G X 1/2" 1 ML MISC Use with insulin up to QID PRN 09/27/16   SWardell Honour MD  topiramate (TOPAMAX) 200 MG tablet Take 1 tablet (200 mg total) by mouth daily. Patient not taking: Reported on 10/13/2016 09/27/16   SWardell Honour MD    Physical Exam: Vitals:   10/14/16 0000 10/14/16 0100 10/14/16 0101 10/14/16 0125  BP: 104/62 100/60 110/68 93/63  Pulse: 69 75 74 72  Resp: '14 15 16 16  ' Temp:    97.7 F (36.5 C)  TempSrc:    Oral  SpO2: 97% 96% 98% 97%  Weight:      Height:       Constitutional: NAD, calm, comfortable Vitals:   10/14/16 0000 10/14/16 0100 10/14/16 0101  10/14/16 0125  BP: 104/62 100/60 110/68 93/63  Pulse: 69 75 74 72  Resp: '14 15 16 16  ' Temp:    97.7 F (36.5 C)  TempSrc:    Oral  SpO2: 97% 96% 98% 97%  Weight:      Height:       Eyes: PERRL, lids and conjunctivae normal ENMT: Mucous membranes are moist. Posterior pharynx clear of any exudate or lesions.Normal dentition.  Neck: normal, supple, no masses, no thyromegaly Respiratory: clear to auscultation bilaterally, no wheezing, no crackles. Normal respiratory effort. No accessory muscle use.  Cardiovascular: Regular rate and rhythm, no murmurs / rubs / gallops. No extremity edema. 2+ pedal pulses. No carotid bruits.  Abdomen: no tenderness, no masses palpated. No  hepatosplenomegaly. Bowel sounds positive.  Musculoskeletal: no clubbing / cyanosis. No joint deformity upper and lower extremities. Good ROM, no contractures. Normal muscle tone.  Skin: no rashes, lesions, ulcers. No induration Neurologic: CN 2-12 grossly intact. Sensation intact, DTR normal. Strength 5/5 in all 4.  Psychiatric: Normal judgment and insight. Alert and oriented x 3. Normal mood.   Labs on Admission: I have personally reviewed following labs and imaging studies CBC:  Recent Labs Lab 10/13/16 2215 10/13/16 2222  WBC 9.0  --   NEUTROABS 4.9  --   HGB 13.5 13.6  HCT 38.8* 40.0  MCV 91.1  --   PLT 231  --    Basic Metabolic Panel:  Recent Labs Lab 10/13/16 2215 10/13/16 2222  NA 137 137  K 3.3* 3.3*  CL 99* 97*  CO2 24  --   GLUCOSE 273* 269*  BUN 20 18  CREATININE 1.81* 1.70*  CALCIUM 9.7  --    GFR: Estimated Creatinine Clearance: 59 mL/min (by C-G formula based on SCr of 1.7 mg/dL (H)). Liver Function Tests:  Recent Labs Lab 10/13/16 2215  AST 37  ALT 23  ALKPHOS 60  BILITOT 0.7  PROT 6.9  ALBUMIN 3.8   Urine analysis:    Component Value Date/Time   COLORURINE YELLOW 10/14/2016 0034   APPEARANCEUR CLEAR 10/14/2016 0034   LABSPEC 1.011 10/14/2016 0034   PHURINE 5.0  10/14/2016 0034   GLUCOSEU 50 (A) 10/14/2016 0034   HGBUR NEGATIVE 10/14/2016 0034   BILIRUBINUR NEGATIVE 10/14/2016 0034   KETONESUR NEGATIVE 10/14/2016 0034   PROTEINUR 30 (A) 10/14/2016 0034   NITRITE NEGATIVE 10/14/2016 0034   LEUKOCYTESUR NEGATIVE 10/14/2016 0034    Recent Results (from the past 240 hour(s))  Blood culture (routine x 2)     Status: None (Preliminary result)   Collection Time: 10/13/16 11:16 PM  Result Value Ref Range Status   Specimen Description BLOOD LEFT ARM  Final   Special Requests BOTTLES DRAWN AEROBIC AND ANAEROBIC [facility] EACH  Final   Culture PENDING  Incomplete   Report Status PENDING  Incomplete  Blood culture (routine x 2)     Status: None (Preliminary result)   Collection Time: 10/13/16 11:20 PM  Result Value Ref Range Status   Specimen Description BLOOD LEFT HAND  Final   Special Requests BOTTLES DRAWN AEROBIC AND ANAEROBIC [facility] EACH  Final   Culture PENDING  Incomplete   Report Status PENDING  Incomplete     Radiological Exams on Admission: Ct Angio Chest Pe W And/or Wo Contrast  Result Date: 10/14/2016 CLINICAL DATA:  Syncope and hypoxia EXAM: CT ANGIOGRAPHY CHEST WITH CONTRAST TECHNIQUE: Multidetector CT imaging of the chest was performed using the standard protocol during bolus administration of intravenous contrast. Multiplanar CT image reconstructions and MIPs were obtained to evaluate the vascular anatomy. CONTRAST:  75 mL Isovue 370 intravenous COMPARISON:  Chest x-ray 10/13/2016 FINDINGS: Cardiovascular: Satisfactory opacification of the pulmonary arteries to the segmental level. No evidence of pulmonary embolism. Normal heart size. No pericardial effusion. Mild coronary artery calcifications. Non aneurysmal aorta. No dissection. Mediastinum/Nodes: No enlarged mediastinal, hilar, or axillary lymph nodes. Thyroid gland, trachea, and esophagus demonstrate no significant findings. Lungs/Pleura: Mild paraseptal emphysema within the bilateral upper  lobes. No consolidation. No pleural effusion or pneumothorax. Scarring or atelectasis within the bilateral lung bases. Upper Abdomen: Fatty infiltration of the liver. No acute abnormalities. Musculoskeletal: No chest wall abnormality. No acute or significant osseous findings. Review of the MIP images confirms the  above findings. IMPRESSION: 1. No CT evidence for acute pulmonary embolus or aortic dissection 2. Mild emphysematous disease within the upper lobes. Lung fields are clear. Electronically Signed   By: Donavan Foil M.D.   On: 10/14/2016 00:44   Dg Chest Portable 1 View  Result Date: 10/13/2016 CLINICAL DATA:  Two syncopal episodes today, fell off toilet and hit head. History of hypertension and diabetes. EXAM: PORTABLE CHEST 1 VIEW COMPARISON:  None. FINDINGS: Cardiac silhouette is mildly enlarged, silhouette is unremarkable for this low inspiratory examination with crowded vasculature markings. The lungs are clear without pleural effusions or focal consolidations. Trachea projects midline and there is no pneumothorax. Included soft tissue planes and osseous structures are non-suspicious. Possible loose body projecting over LEFT clavicle. IMPRESSION: Mild cardiomegaly, no acute pulmonary process in this low inspiratory portable examination. Electronically Signed   By: Elon Alas M.D.   On: 10/13/2016 22:28    EKG: Independently reviewed.   Assessment/Plan Principal Problem:   Syncope and collapse Active Problems:   Controlled type 2 diabetes with neuropathy (HCC)   Essential hypertension, benign   Bipolar disorder ([place])   Panic disorder    PLAN:   Syncope:  Will place on telemetry, cycle troponins and obtain ECHO, but I think he has volume depletion from glycouria and on anti HTN meds.  I doubt cardiac syncope.  He also has symptoms of autonomic dysfx for this past year and will need to avoid too quick standing from incumbent position.  Will obtain head CT without contrast.    Volume depletion:  Correct BS, give IVF, and hold Cozaar at this point.  DM:  Likely the cause of autonomic dysFx.    Loud snoring:  Suspicious for OSA.  He should follow up with PCP for polysomnogram.    DVT prophylaxis: Levonox. SQ.  Code Status: FULL CODE.  Family Communication: daughter at bedside.  Disposition Plan: Home when appropriate.  Consults called: None.  Admission status: Inpatient.    Ralene Gasparyan MD FACP. [facility]  If 7PM-7AM, please contact night-coverage [email or web address] Password [email or web address]  10/14/2016, 1:33 AM

## 2016-10-14 NOTE — ED Notes (Signed)
Pt's daughter contact information Marcelle Smilingatasha  [phone]

## 2016-10-14 NOTE — Clinical Social Work Note (Addendum)
CSW received consult for medication assistance. Will notify CM. CSW signing off.  Merle Whitehorn, LCSW [phone] 

## 2016-10-14 NOTE — ED Notes (Signed)
Dr le at bedside,  

## 2016-10-15 ENCOUNTER — Inpatient Hospital Stay (HOSPITAL_COMMUNITY)
Admit: 2016-10-15 | Discharge: 2016-10-15 | Disposition: A | Discharge status: Home / Self Care | Payer: Medicare Other | Attending: Neurology | Admitting: Neurology

## 2016-10-15 LAB — CBC
HEMATOCRIT: 38 % — AB (ref 39.0–52.0)
HEMOGLOBIN: 13.1 g/dL (ref 13.0–17.0)
MCH: 31.5 pg (ref 26.0–34.0)
MCHC: 34.5 g/dL (ref 30.0–36.0)
MCV: 91.3 fL (ref 78.0–100.0)
Platelets: 153 10*3/uL (ref 150–400)
RBC: 4.16 MIL/uL — ABNORMAL LOW (ref 4.22–5.81)
RDW: 12.7 % (ref 11.5–15.5)
WBC: 5.9 10*3/uL (ref 4.0–10.5)

## 2016-10-15 LAB — URINE CULTURE: Culture: NO GROWTH

## 2016-10-15 LAB — BASIC METABOLIC PANEL
ANION GAP: 7 (ref 5–15)
BUN: 12 mg/dL (ref 6–20)
CALCIUM: 9 mg/dL (ref 8.9–10.3)
CO2: 25 mmol/L (ref 22–32)
Chloride: 102 mmol/L (ref 101–111)
Creatinine, Ser: 0.83 mg/dL (ref 0.61–1.24)
GFR calc Af Amer: >60 mL/min (ref >60)
GLUCOSE: 345 mg/dL — AB (ref 65–99)
Potassium: 4.2 mmol/L (ref 3.5–5.1)
Sodium: 134 mmol/L — ABNORMAL LOW (ref 135–145)

## 2016-10-15 LAB — HEMOGLOBIN A1C
HEMOGLOBIN A1C: 9 % — AB (ref 4.8–5.6)
Mean Plasma Glucose: 212 mg/dL

## 2016-10-15 LAB — GLUCOSE, CAPILLARY
GLUCOSE-CAPILLARY: 222 mg/dL — AB (ref 65–99)
Glucose-Capillary: 410 mg/dL — ABNORMAL HIGH (ref 65–99)

## 2016-10-15 LAB — RPR: RPR: NONREACTIVE

## 2016-10-15 LAB — HIV ANTIBODY (ROUTINE TESTING W REFLEX): HIV SCREEN 4TH GENERATION: NONREACTIVE

## 2016-10-15 MED ORDER — INSULIN ASPART 100 UNIT/ML ~~LOC~~ SOLN
25.0000 [IU] | Freq: Once | SUBCUTANEOUS | Status: AC
  Administered 2016-10-15: 25 [IU] via SUBCUTANEOUS

## 2016-10-15 MED ORDER — LOSARTAN POTASSIUM 25 MG PO TABS: 25.0000 mg | ORAL_TABLET | Freq: Every day | ORAL | 0 refills | Status: DC

## 2016-10-15 MED ORDER — LOSARTAN POTASSIUM 50 MG PO TABS: 25.0000 mg | ORAL_TABLET | Freq: Every day | ORAL | Status: DC

## 2016-10-15 NOTE — Care Management Note (Signed)
Case Management Note  Patient Details  Name: Kyra Leylandlbert D. Gordy LevanWalton MRN: [id number] Date of Birth: 04/04/1961   Expected Discharge Date:  10/15/16               Expected Discharge Plan:  Home/Self Care  In-House Referral:  NA  Discharge planning Services  CM Consult  Post Acute Care Choice:  NA Choice offered to:  NA  Status of Service:  Completed, signed off  Additional Comments: Pt discharging home today with self care. No CM needs.   Malcolm Metrohildress, Nephtali Docken Demske, RN 10/15/2016, 3:45 PM

## 2016-10-15 NOTE — Progress Notes (Signed)
Inpatient Diabetes Program Recommendations  AACE/ADA: New Consensus Statement on Inpatient Glycemic Control (2015)  Target Ranges:  Prepandial:   less than 140 mg/dL      Peak postprandial:   less than 180 mg/dL (1-2 hours)      Critically ill patients:  140 - 180 mg/dL   Results for [name], Jacob D. (MRN [id number]) as of 10/15/2016 13:06  Ref. Range 10/14/2016 07:25 10/14/2016 10:52 10/14/2016 16:02 10/14/2016 21:28 10/15/2016 08:15 10/15/2016 11:35  Glucose-Capillary Latest Ref Range: 65 - 99 mg/dL 956341 (H) 213206 (H) 086145 (H) 363 (H) 222 (H) 410 (H)   Review of Glycemic Control  Diabetes history: DM2 Outpatient Diabetes medications: NPH 25 units BID, Regular 0-10 units TID wiith meals, Metformin 1000 mg BID Current orders for Inpatient glycemic control: Lantus 10 units QHS, Novolog 0-20 units TID with meals, Novolog 0-5 units QHS  Inpatient Diabetes Program Recommendations:   Please consider increasing Lantus to 20 units daily. Please consider ordering Novolog meal coverage 5 units TID with meals if patient eats at least 50% of meal.     Thanks, Orlando PennerMarie Shyonna Carlin, RN, MSN, CDE Diabetes Coordinator Inpatient Diabetes Program [phone] (Team Pager from 8am to 5pm)

## 2016-10-15 NOTE — Discharge Summary (Signed)
Physician Discharge Summary  Jacob Bautista. Jacob Bautista LSL:[id number] DOB: April 01, 1961 DOA: 10/13/2016  PCP: Wardell Honour, MD  Admit date: 10/13/2016 Discharge date: 10/15/2016  Admitted From: Home Disposition:  Home  Recommendations for Outpatient Follow-up:  1. Follow up with PCP in 1-2 weeks 2. Please obtain BMP/CBC in one week 3. Please follow up on results of EEG done on 10/15/16   Home Health:No Equipment/Devices: None Discharge Condition: Stable  CODE STATUS:FULL  Diet recommendation: Heart Healthy / Carb Modified    Brief/Interim Summary: 56 year old male with a history of diabetes mellitus, hypertension, PTSD, hypothyroidism presents with a syncopal episode on 10/13/2016. The patient states that he was initially sitting on a couch watching his grandson playing video games, anorexia and he remembered was his daughter trying to wake him up. He stated that he had urinary incontinence during that episode. Later in the day, the patient the patient was sitting on the commode, and the next day he remembered was being on the floor with his daughter trying to wake him up. He did not have any urinary or bowel incontinence with his second episode. However, he denies any prodromal symptoms or aura. The patient states that he has had dizziness and "head rush" sensation with positional changes for the past 6 months. He denies any new medications other than changes in his insulin regimen. He states that he was able to walk 6 miles approximately 2-3 days prior to this admission, he has occasional shortness of breath when he has to "walk up hill". He denies any fevers, chills, chest pain, nausea, vomiting, diarrhea, abdominal pain. He denies any dysuria or hematuria, hematochezia, melena. He continues to smoke 1 pack per day. He uses marijuana. In the emergency room, the patient was noted to be hypoxemic. CTangio of the chest was done and was negative for pulmonary embolus. In addition, the patient was noted  to be hypotensive with systolic blood pressures in the 70s. As a result, the patient was admitted for further evaluation.   Discharge Diagnoses:  Syncope and collapse -secondary to volume depletion and hypotension -the patient could have a degree of dysautonomia secondary to his diabetes mellitus -Check orthostatic vital signs--neg -Echocardiogram--EF 55-60 percent, no WMA, no AS -No concerning dysrhythmia on telemetry -CT brain negative -EEG--done but results pending at time of d/c -CK--72  Acute kidney injury -Secondary to volume depletion -Continue IV fluids-->improving -Discontinue losartan during hospitalization -restart at lower dose 25 mg daily after d/c -recheck BMP in one week at PCP -Baseline creatinine likely around 0.9 -presenting creatinine 1.81 -Discharge creatinine 0.83  Lactic acidosis/hypotension -Due to volume depletion -Follow blood cultures--neg at time of d/c -Urinalysis negative for pyuria  Polysubstance abuse -THC and tobacco -UDS--positive THC -cessation discussed -HIV--negative  Hypertension -Discontinue losartan in the setting of hypotension --restart at lower dose 25 mg daily after d/c  Diabetes mellitus type 2, poorly controlled with neuropathy -The patient has glycosuria -Hemoglobin A1c--9.0 -pt endorsed noncompliance with NPH and dietary indiscretion at thome -restart home dose NPH and SSI after d/c -NovoLog sliding scale -Continue gabapentin -restart metformin after discharge  PTSD -Continue Xanax home dose -Continue fluoxetine and topamax  Hypothyroidism -Continue Synthroid -TSH 0.747    Discharge Instructions  Discharge Instructions    Diet - low sodium heart healthy    Complete by:  As directed    Increase activity slowly    Complete by:  As directed      Allergies as of 10/15/2016   No Known Allergies  Medication List    STOP taking these medications   topiramate 200 MG tablet Commonly known as:   TOPAMAX     TAKE these medications   albuterol 108 (90 Base) MCG/ACT inhaler Commonly known as:  PROVENTIL HFA;VENTOLIN HFA Inhale 1-2 puffs into the lungs every 6 (six) hours as needed for wheezing or shortness of breath.   ALPRAZolam 0.5 MG tablet Commonly known as:  XANAX Take 1 tablet (0.5 mg total) by mouth daily as needed for anxiety.   aspirin EC 81 MG tablet Take 81 mg by mouth daily.   Blood Glucose Monitoring Suppl w/Device Kit Test BS BID and PRN E11.9   FLUoxetine 40 MG capsule Commonly known as:  PROZAC Take 1 capsule (40 mg total) by mouth daily.   gabapentin 300 MG capsule Commonly known as:  NEURONTIN Take 1 capsule (300 mg total) by mouth 2 (two) times daily.   gemfibrozil 600 MG tablet Commonly known as:  LOPID Take 1 tablet (600 mg total) by mouth 2 (two) times daily before a meal.   insulin NPH Human 100 UNIT/ML injection Commonly known as:  HUMULIN N,NOVOLIN N Inject 0.25 mLs (25 Units total) into the skin 2 (two) times daily before a meal.   insulin regular 250 units/2.22m (100 units/mL) injection Commonly known as:  NOVOLIN R,HUMULIN R Inject 0-0.1 mLs (0-10 Units total) into the skin 2 (two) times daily before a meal. Sliding scale   INSULIN SYRINGE 1CC/29G 29G X 1/2" 1 ML Misc Commonly known as:  B-D INSULIN SYRINGE Use with insulin up to QID PRN   levothyroxine 50 MCG tablet Commonly known as:  SYNTHROID, LEVOTHROID Take 1 tablet (50 mcg total) by mouth daily before breakfast.   losartan 25 MG tablet Commonly known as:  COZAAR Take 1 tablet (25 mg total) by mouth daily. Start taking on:  10/16/2016 What changed:  medication strength  how much to take   metFORMIN 1000 MG tablet Commonly known as:  GLUCOPHAGE Take 1 tablet (1,000 mg total) by mouth 2 (two) times daily with a meal.   mirtazapine 45 MG tablet Commonly known as:  REMERON Take 1 tablet (45 mg total) by mouth at bedtime.       No Known  Allergies  Consultations:  none   Procedures/Studies: Ct Head Wo Contrast  Result Date: 10/14/2016 CLINICAL DATA:  Fall with history of dizziness and headache hit posterior head EXAM: CT HEAD WITHOUT CONTRAST TECHNIQUE: Contiguous axial images were obtained from the base of the skull through the vertex without intravenous contrast. COMPARISON:  None. FINDINGS: Brain: Examination is mildly limited by head positioning. There is no acute territorial infarction or intracranial hemorrhage. No focal mass, mass effect or midline shift. The ventricles are nonenlarged. There is mild atrophy. Vascular: No hyperdense vessels. Scattered calcifications at the carotid siphon Skull: Mastoid air cells are clear. There is no depressed skull fracture. Sinuses/Orbits: Partially visualized postsurgical changes of the anterior wall of the right maxillary sinus. No acute orbital abnormality Other: Artifact from metallic foreign body in the left parietal scalp. IMPRESSION: No definite CT evidence for acute intracranial abnormality Electronically Signed   By: KDonavan FoilM.D.   On: 10/14/2016 03:03   Ct Angio Chest Pe W And/or Wo Contrast  Result Date: 10/14/2016 CLINICAL DATA:  Syncope and hypoxia EXAM: CT ANGIOGRAPHY CHEST WITH CONTRAST TECHNIQUE: Multidetector CT imaging of the chest was performed using the standard protocol during bolus administration of intravenous contrast. Multiplanar CT image reconstructions and MIPs were  obtained to evaluate the vascular anatomy. CONTRAST:  75 mL Isovue 370 intravenous COMPARISON:  Chest x-ray 10/13/2016 FINDINGS: Cardiovascular: Satisfactory opacification of the pulmonary arteries to the segmental level. No evidence of pulmonary embolism. Normal heart size. No pericardial effusion. Mild coronary artery calcifications. Non aneurysmal aorta. No dissection. Mediastinum/Nodes: No enlarged mediastinal, hilar, or axillary lymph nodes. Thyroid gland, trachea, and esophagus demonstrate no  significant findings. Lungs/Pleura: Mild paraseptal emphysema within the bilateral upper lobes. No consolidation. No pleural effusion or pneumothorax. Scarring or atelectasis within the bilateral lung bases. Upper Abdomen: Fatty infiltration of the liver. No acute abnormalities. Musculoskeletal: No chest wall abnormality. No acute or significant osseous findings. Review of the MIP images confirms the above findings. IMPRESSION: 1. No CT evidence for acute pulmonary embolus or aortic dissection 2. Mild emphysematous disease within the upper lobes. Lung fields are clear. Electronically Signed   By: Donavan Foil M.D.   On: 10/14/2016 00:44   Dg Chest Portable 1 View  Result Date: 10/13/2016 CLINICAL DATA:  Two syncopal episodes today, fell off toilet and hit head. History of hypertension and diabetes. EXAM: PORTABLE CHEST 1 VIEW COMPARISON:  None. FINDINGS: Cardiac silhouette is mildly enlarged, silhouette is unremarkable for this low inspiratory examination with crowded vasculature markings. The lungs are clear without pleural effusions or focal consolidations. Trachea projects midline and there is no pneumothorax. Included soft tissue planes and osseous structures are non-suspicious. Possible loose body projecting over LEFT clavicle. IMPRESSION: Mild cardiomegaly, no acute pulmonary process in this low inspiratory portable examination. Electronically Signed   By: Elon Alas M.D.   On: 10/13/2016 22:28         Discharge Exam: Vitals:   10/15/16 0653 10/15/16 1437  BP: 130/78 (!) 142/60  Pulse: 63 73  Resp: 18 18  Temp: 97.7 F (36.5 C) 98.2 F (36.8 C)   Vitals:   10/14/16 1806 10/14/16 2122 10/15/16 0653 10/15/16 1437  BP: 116/74 126/77 130/78 (!) 142/60  Pulse: 66 68 63 73  Resp: '18 18 18 18  ' Temp: 97.6 F (36.4 C) 98.3 F (36.8 C) 97.7 F (36.5 C) 98.2 F (36.8 C)  TempSrc: Oral Oral Oral Oral  SpO2: 99% 98% 97% 96%  Weight: 99.3 kg (219 lb)     Height:        General:  Pt is alert, awake, not in acute distress Cardiovascular: RRR, S1/S2 +, no rubs, no gallops Respiratory: CTA bilaterally, no wheezing, no rhonchi Abdominal: Soft, NT, ND, bowel sounds + Extremities: no edema, no cyanosis   The results of significant diagnostics from this hospitalization (including imaging, microbiology, ancillary and laboratory) are listed below for reference.    Significant Diagnostic Studies: Ct Head Wo Contrast  Result Date: 10/14/2016 CLINICAL DATA:  Fall with history of dizziness and headache hit posterior head EXAM: CT HEAD WITHOUT CONTRAST TECHNIQUE: Contiguous axial images were obtained from the base of the skull through the vertex without intravenous contrast. COMPARISON:  None. FINDINGS: Brain: Examination is mildly limited by head positioning. There is no acute territorial infarction or intracranial hemorrhage. No focal mass, mass effect or midline shift. The ventricles are nonenlarged. There is mild atrophy. Vascular: No hyperdense vessels. Scattered calcifications at the carotid siphon Skull: Mastoid air cells are clear. There is no depressed skull fracture. Sinuses/Orbits: Partially visualized postsurgical changes of the anterior wall of the right maxillary sinus. No acute orbital abnormality Other: Artifact from metallic foreign body in the left parietal scalp. IMPRESSION: No definite CT evidence for acute  intracranial abnormality Electronically Signed   By: Donavan Foil M.D.   On: 10/14/2016 03:03   Ct Angio Chest Pe W And/or Wo Contrast  Result Date: 10/14/2016 CLINICAL DATA:  Syncope and hypoxia EXAM: CT ANGIOGRAPHY CHEST WITH CONTRAST TECHNIQUE: Multidetector CT imaging of the chest was performed using the standard protocol during bolus administration of intravenous contrast. Multiplanar CT image reconstructions and MIPs were obtained to evaluate the vascular anatomy. CONTRAST:  75 mL Isovue 370 intravenous COMPARISON:  Chest x-ray 10/13/2016 FINDINGS:  Cardiovascular: Satisfactory opacification of the pulmonary arteries to the segmental level. No evidence of pulmonary embolism. Normal heart size. No pericardial effusion. Mild coronary artery calcifications. Non aneurysmal aorta. No dissection. Mediastinum/Nodes: No enlarged mediastinal, hilar, or axillary lymph nodes. Thyroid gland, trachea, and esophagus demonstrate no significant findings. Lungs/Pleura: Mild paraseptal emphysema within the bilateral upper lobes. No consolidation. No pleural effusion or pneumothorax. Scarring or atelectasis within the bilateral lung bases. Upper Abdomen: Fatty infiltration of the liver. No acute abnormalities. Musculoskeletal: No chest wall abnormality. No acute or significant osseous findings. Review of the MIP images confirms the above findings. IMPRESSION: 1. No CT evidence for acute pulmonary embolus or aortic dissection 2. Mild emphysematous disease within the upper lobes. Lung fields are clear. Electronically Signed   By: Donavan Foil M.D.   On: 10/14/2016 00:44   Dg Chest Portable 1 View  Result Date: 10/13/2016 CLINICAL DATA:  Two syncopal episodes today, fell off toilet and hit head. History of hypertension and diabetes. EXAM: PORTABLE CHEST 1 VIEW COMPARISON:  None. FINDINGS: Cardiac silhouette is mildly enlarged, silhouette is unremarkable for this low inspiratory examination with crowded vasculature markings. The lungs are clear without pleural effusions or focal consolidations. Trachea projects midline and there is no pneumothorax. Included soft tissue planes and osseous structures are non-suspicious. Possible loose body projecting over LEFT clavicle. IMPRESSION: Mild cardiomegaly, no acute pulmonary process in this low inspiratory portable examination. Electronically Signed   By: Elon Alas M.D.   On: 10/13/2016 22:28     Microbiology: Recent Results (from the past 240 hour(s))  Blood culture (routine x 2)     Status: None (Preliminary result)    Collection Time: 10/13/16 11:16 PM  Result Value Ref Range Status   Specimen Description BLOOD LEFT ARM  Final   Special Requests BOTTLES DRAWN AEROBIC AND ANAEROBIC 6CC EACH  Final   Culture NO GROWTH 2 DAYS  Final   Report Status PENDING  Incomplete  Blood culture (routine x 2)     Status: None (Preliminary result)   Collection Time: 10/13/16 11:20 PM  Result Value Ref Range Status   Specimen Description BLOOD LEFT HAND  Final   Special Requests BOTTLES DRAWN AEROBIC AND ANAEROBIC [place]  Final   Culture NO GROWTH 2 DAYS  Final   Report Status PENDING  Incomplete  Urine culture     Status: None   Collection Time: 10/14/16 12:34 AM  Result Value Ref Range Status   Specimen Description URINE, CLEAN CATCH  Final   Special Requests NONE  Final   Culture   Final    NO GROWTH Performed at [facility], [place] [address] [place], [place] [address]    Report Status 10/15/2016 FINAL  Final  MRSA PCR Screening     Status: None   Collection Time: 10/14/16  3:30 AM  Result Value Ref Range Status   MRSA by PCR NEGATIVE NEGATIVE Final    Comment:  The GeneXpert MRSA Assay (FDA approved for NASAL specimens only), is one component of a comprehensive MRSA colonization surveillance program. It is not intended to diagnose MRSA infection nor to guide or monitor treatment for MRSA infections.      Labs: Basic Metabolic Panel:  Recent Labs Lab 10/13/16 2215 10/13/16 2222 10/14/16 0333 10/15/16 1234  NA 137 137 135 134*  K 3.3* 3.3* 5.0 4.2  CL 99* 97* 105 102  CO2 24  --  23 25  GLUCOSE 273* 269* 358* 345*  BUN '20 18 19 12  ' CREATININE 1.81* 1.70* 1.21 0.83  CALCIUM 9.7  --  8.6* 9.0   Liver Function Tests:  Recent Labs Lab 10/13/16 2215  AST 37  ALT 23  ALKPHOS 60  BILITOT 0.7  PROT 6.9  ALBUMIN 3.8   No results for input(s): LIPASE, AMYLASE in the last 168 hours. No results for input(s): AMMONIA in the last 168 hours. CBC:  Recent Labs Lab  10/13/16 2215 10/13/16 2222 10/14/16 0333 10/15/16 1234  WBC 9.0  --  6.4 5.9  NEUTROABS 4.9  --   --   --   HGB 13.5 13.6 12.9* 13.1  HCT 38.8* 40.0 37.1* 38.0*  MCV 91.1  --  91.8 91.3  PLT 231  --  169 153   Cardiac Enzymes:  Recent Labs Lab 10/14/16 0333 10/14/16 0928 10/14/16 1457  CKTOTAL  --  72  --   TROPONINI <0.03 <0.03 <0.03   BNP: Invalid input(s): POCBNP CBG:  Recent Labs Lab 10/14/16 1052 10/14/16 1602 10/14/16 2128 10/15/16 0815 10/15/16 1135  GLUCAP 206* 145* 363* 222* 410*    Time coordinating discharge:  Greater than 30 minutes  Signed:  Khelani Kops, DO [facility] Pager: [phone] 10/15/2016, 3:15 PM

## 2016-10-15 NOTE — Progress Notes (Signed)
EEG Completed; Results Pending  

## 2016-10-15 NOTE — Progress Notes (Signed)
Patient impatient, not wanting to wait for the doctor to see him.  States he wants to leave AMA and "doesn't have time to wait all day"  MD notified and went to see patient.  MD stated to this RN that patient said he would stay until the rest of his lab work came back.  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  Security called to find patient, as IVs are still in place.

## 2016-10-15 NOTE — Progress Notes (Signed)
Patient discharged home.  IVs removed - WNL. Reviewed DC instructions and medication changes.  Reinforced education on DM management at home including diet and checking blood sugar regularly to prevent further syncopal episodes. Verbalizes understanding.  No questions at this time.  Patient assisted off unit with belongings, ambulating in NAD.

## 2016-10-16 NOTE — Procedures (Signed)
  Jacob A. Merlene Laughter, MD     [email or web address]           HISTORY: The patient is a 56 year old male who presents with 2 episodes of loss of consciousness. During both episodes the patient was found on the floor and amnestic to the event. The studies being done to evaluate for seizures as a cause of these events.  MEDICATIONS: Scheduled Meds: Continuous Infusions: PRN Meds:.  Prior to Admission medications   Medication Sig Start Date End Date Taking? Authorizing Provider  albuterol (PROVENTIL HFA;VENTOLIN HFA) 108 (90 Base) MCG/ACT inhaler Inhale 1-2 puffs into the lungs every 6 (six) hours as needed for wheezing or shortness of breath.    Historical Provider, MD  ALPRAZolam Duanne Moron) 0.5 MG tablet Take 1 tablet (0.5 mg total) by mouth daily as needed for anxiety. 09/27/16   Wardell Honour, MD  aspirin EC 81 MG tablet Take 81 mg by mouth daily.    Historical Provider, MD  Blood Glucose Monitoring Suppl w/Device KIT Test BS BID and PRN E11.9 09/27/16   Wardell Honour, MD  FLUoxetine (PROZAC) 40 MG capsule Take 1 capsule (40 mg total) by mouth daily. 09/27/16   Wardell Honour, MD  gabapentin (NEURONTIN) 300 MG capsule Take 1 capsule (300 mg total) by mouth 2 (two) times daily. 09/27/16   Wardell Honour, MD  gemfibrozil (LOPID) 600 MG tablet Take 1 tablet (600 mg total) by mouth 2 (two) times daily before a meal. 09/27/16   Wardell Honour, MD  insulin NPH Human (HUMULIN N,NOVOLIN N) 100 UNIT/ML injection Inject 0.25 mLs (25 Units total) into the skin 2 (two) times daily before a meal. 09/27/16   Wardell Honour, MD  insulin regular (NOVOLIN R,HUMULIN R) 250 units/2.28m (100 units/mL) injection Inject 0-0.1 mLs (0-10 Units total) into the skin 2 (two) times daily before a meal. Sliding scale 09/27/16   SWardell Honour MD  INSULIN SYRINGE 1CC/29G (B-D INSULIN SYRINGE) 29G X 1/2" 1 ML MISC Use with insulin up to QID PRN 09/27/16   SWardell Honour MD  levothyroxine  (SYNTHROID, LEVOTHROID) 50 MCG tablet Take 1 tablet (50 mcg total) by mouth daily before breakfast. 09/27/16   SWardell Honour MD  losartan (COZAAR) 25 MG tablet Take 1 tablet (25 mg total) by mouth daily. 10/16/16   DOrson Eva MD  metFORMIN (GLUCOPHAGE) 1000 MG tablet Take 1 tablet (1,000 mg total) by mouth 2 (two) times daily with a meal. 09/27/16   SWardell Honour MD  mirtazapine (REMERON) 45 MG tablet Take 1 tablet (45 mg total) by mouth at bedtime. 09/27/16   SWardell Honour MD      ANALYSIS: A 16 channel recording using standard 10 20 measurements is conducted for 21 minutes. The background activity gases high as 8 and half hertz which attenuates with eye opening. There is beta activity observing the frontal areas. Awake and sleep architecture is observed. K complexes and sleep spindles are briefly observed. Photic simulation and hypoventilation are not conducted. There is no focal or lateral slowing. There are a few episodes of sharp wave activity that phase reverses that T5.   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      Bryler Dibble A. DMerlene Laughter M.D.  Diplomate, ATax adviserof Psychiatry and Neurology ( Neurology).

## 2016-10-18 LAB — CULTURE, BLOOD (ROUTINE X 2)
Culture: NO GROWTH
Culture: NO GROWTH

## 2016-10-29 ENCOUNTER — Ambulatory Visit (INDEPENDENT_AMBULATORY_CARE_PROVIDER_SITE_OTHER): Payer: Medicare Other | Admitting: Family Medicine

## 2016-10-29 ENCOUNTER — Encounter: Payer: Self-pay | Admitting: Family Medicine

## 2016-10-29 ENCOUNTER — Encounter (INDEPENDENT_AMBULATORY_CARE_PROVIDER_SITE_OTHER): Payer: Self-pay

## 2016-10-29 VITALS — BP 113/72 | HR 71 | Temp 97.3°F | Ht 72.0 in | Wt 224.0 lb

## 2016-10-29 DIAGNOSIS — E1140 Type 2 diabetes mellitus with diabetic neuropathy, unspecified: Secondary | ICD-10-CM

## 2016-10-29 DIAGNOSIS — M549 Dorsalgia, unspecified: Secondary | ICD-10-CM | POA: Diagnosis not present

## 2016-10-29 DIAGNOSIS — M25551 Pain in right hip: Secondary | ICD-10-CM | POA: Diagnosis not present

## 2016-10-29 MED ORDER — OXYCODONE-ACETAMINOPHEN 5-325 MG PO TABS: 1.0000 | ORAL_TABLET | Freq: Four times a day (QID) | ORAL | 0 refills | Status: DC | PRN

## 2016-10-29 MED ORDER — ALBUTEROL SULFATE HFA 108 (90 BASE) MCG/ACT IN AERS: 1.0000 | INHALATION_SPRAY | Freq: Four times a day (QID) | RESPIRATORY_TRACT | 1 refills | Status: DC | PRN

## 2016-10-29 MED ORDER — ALPRAZOLAM 0.5 MG PO TABS: 0.5000 mg | ORAL_TABLET | Freq: Every day | ORAL | 0 refills | Status: DC | PRN

## 2016-10-29 NOTE — Progress Notes (Signed)
Subjective:    Patient ID: Jacob Bautista. [place], male    DOB: 1961-06-18, 56 y.o.   MRN: [id number]  HPI Patient here today for 1 month follow up from initial office visit. Patient was hospitalized in [place] since his first visit here. He was told he was dehydrated his blood sugar was up and his blood pressure was low. He had no history to support dehydration. Management of his diabetes is a work in progress and I am not surprised that it was elevated. Medications include NPH as well as regular insulin and metformin. He is complaining more today of pain in his back and hips. Had previously been on narcotics for the back pain. He is taking anti-inflammatories which I explained to her relatively contraindicated iin view of his diabetes    Patient Active Problem List   Diagnosis Date Noted  . Syncope and collapse 10/14/2016  . Uncontrolled type 2 diabetes mellitus with hyperglycemia, with long-term current use of insulin ([place]) 10/14/2016  . Lactic acidosis 10/14/2016  . AKI (acute kidney injury) ([place]) 10/14/2016  . Hypotension   . Controlled type 2 diabetes with neuropathy ([place]) 09/27/2016  . Essential hypertension, benign 09/27/2016  . Hypothyroidism 09/27/2016  . Bipolar disorder ([place]) 09/27/2016  . BPH (benign prostatic hyperplasia) 09/27/2016  . Panic disorder 09/27/2016   Outpatient Encounter Prescriptions as of 10/29/2016  Medication Sig  . albuterol (PROVENTIL HFA;VENTOLIN HFA) 108 (90 Base) MCG/ACT inhaler Inhale 1-2 puffs into the lungs every 6 (six) hours as needed for wheezing or shortness of breath.  . ALPRAZolam (XANAX) 0.5 MG tablet Take 1 tablet (0.5 mg total) by mouth daily as needed for anxiety.  [name] aspirin EC 81 MG tablet Take 81 mg by mouth daily.  . Blood Glucose Monitoring Suppl w/Device KIT Test BS BID and PRN E11.9  . FLUoxetine (PROZAC) 40 MG capsule Take 1 capsule (40 mg total) by mouth daily.  [name] gabapentin (NEURONTIN) 300 MG capsule Take 1 capsule (300 mg total)  by mouth 2 (two) times daily.  [name] gemfibrozil (LOPID) 600 MG tablet Take 1 tablet (600 mg total) by mouth 2 (two) times daily before a meal.  . insulin NPH Human (HUMULIN N,NOVOLIN N) 100 UNIT/ML injection Inject 0.25 mLs (25 Units total) into the skin 2 (two) times daily before a meal.  . insulin regular (NOVOLIN R,HUMULIN R) 250 units/2.8m (100 units/mL) injection Inject 0-0.1 mLs (0-10 Units total) into the skin 2 (two) times daily before a meal. Sliding scale  . INSULIN SYRINGE 1CC/29G (B-D INSULIN SYRINGE) 29G X 1/2" 1 ML MISC Use with insulin up to QID PRN  . levothyroxine (SYNTHROID, LEVOTHROID) 50 MCG tablet Take 1 tablet (50 mcg total) by mouth daily before breakfast.  . losartan (COZAAR) 25 MG tablet Take 1 tablet (25 mg total) by mouth daily.  . metFORMIN (GLUCOPHAGE) 1000 MG tablet Take 1 tablet (1,000 mg total) by mouth 2 (two) times daily with a meal.  . mirtazapine (REMERON) 45 MG tablet Take 1 tablet (45 mg total) by mouth at bedtime.   No facility-administered encounter medications on file as of 10/29/2016.       Review of Systems  Constitutional: Negative.   HENT: Negative.   Eyes: Negative.   Respiratory: Negative.   Cardiovascular: Negative.   Gastrointestinal: Negative.   Endocrine: Negative.        Elevated BS  Genitourinary: Negative.   Musculoskeletal: Positive for arthralgias (right hip pain and right thumb pain) and back pain.  Skin: Negative.  Allergic/Immunologic: Negative.   Neurological: Negative.   Hematological: Negative.   Psychiatric/Behavioral: Negative.        Objective:   Physical Exam  Constitutional: He is oriented to person, place, and time. He appears well-developed and well-nourished.  HENT:  Head: Normocephalic.  Cardiovascular: Normal rate, regular rhythm and normal heart sounds.   Pulmonary/Chest: Effort normal and breath sounds normal.  Neurological: He is alert and oriented to person, place, and time.  Psychiatric: He has a  normal mood and affect. His behavior is normal.    BP 113/72 (BP Location: Left Arm)   Pulse 71   Temp 97.3 F (36.3 C) (Oral)   Ht 6' (1.829 m)   Wt 224 lb (101.6 kg)   BMI 30.38 kg/m        Assessment & Plan:  1. Back pain, unspecified back location, unspecified back pain laterality, unspecified chronicity Gave patient some oxycodone today for chronic pain in hip and back with referral to pain clinic - Ambulatory referral to Pain Clinic  2. Right hip pain See above - Ambulatory referral to Pain Clinic   3. Controlled type 2 diabetes with neuropathy ([place]) Diabetes is not controlled yet. I think patient has a understanding of foods that he should and should not eat. He is committed to trying to get out and walk, get exercise.  Wardell Honour MD

## 2016-11-12 ENCOUNTER — Ambulatory Visit (INDEPENDENT_AMBULATORY_CARE_PROVIDER_SITE_OTHER): Payer: Medicare Other | Admitting: Family Medicine

## 2016-11-12 ENCOUNTER — Encounter: Payer: Self-pay | Admitting: Family Medicine

## 2016-11-12 VITALS — BP 116/69 | HR 78 | Temp 96.9°F | Ht 72.0 in | Wt 231.6 lb

## 2016-11-12 DIAGNOSIS — M778 Other enthesopathies, not elsewhere classified: Secondary | ICD-10-CM | POA: Diagnosis not present

## 2016-11-12 DIAGNOSIS — M25531 Pain in right wrist: Secondary | ICD-10-CM | POA: Diagnosis not present

## 2016-11-12 MED ORDER — GLUCOSE BLOOD VI STRP: ORAL_STRIP | 12 refills | Status: DC

## 2016-11-12 MED ORDER — ONETOUCH ULTRASOFT LANCETS MISC: 12 refills | Status: DC

## 2016-11-12 MED ORDER — ONETOUCH ULTRA SYSTEM W/DEVICE KIT: 1.0000 | PACK | Freq: Once | 0 refills | Status: AC

## 2016-11-12 NOTE — Progress Notes (Signed)
Subjective:    Patient ID: Jacob Bautista. [place], male    DOB: [birth year], 56 y.o.   MRN: [id number]  HPI patient complains of pain in his right wrist at the base of his thumb. He does not recall any injury but then adds that he may have passed out. He attributes this to the losartan that he is taking for blood pressure. Symptoms of been present about 2 days.  Patient Active Problem List   Diagnosis Date Noted  . Syncope and collapse 10/14/2016  . Uncontrolled type 2 diabetes mellitus with hyperglycemia, with long-term current use of insulin ([place]) 10/14/2016  . Lactic acidosis 10/14/2016  . AKI (acute kidney injury) ([place]) 10/14/2016  . Hypotension   . Controlled type 2 diabetes with neuropathy ([place]) 09/27/2016  . Essential hypertension, benign 09/27/2016  . Hypothyroidism 09/27/2016  . Bipolar disorder ([place]) 09/27/2016  . BPH (benign prostatic hyperplasia) 09/27/2016  . Panic disorder 09/27/2016   Outpatient Encounter Prescriptions as of 11/12/2016  Medication Sig  . albuterol (PROVENTIL HFA;VENTOLIN HFA) 108 (90 Base) MCG/ACT inhaler Inhale 1-2 puffs into the lungs every 6 (six) hours as needed for wheezing or shortness of breath.  . ALPRAZolam (XANAX) 0.5 MG tablet Take 1 tablet (0.5 mg total) by mouth daily as needed for anxiety.  [name] aspirin EC 81 MG tablet Take 81 mg by mouth daily.  [name] FLUoxetine (PROZAC) 40 MG capsule Take 1 capsule (40 mg total) by mouth daily.  [name] gabapentin (NEURONTIN) 300 MG capsule Take 1 capsule (300 mg total) by mouth 2 (two) times daily.  [name] gemfibrozil (LOPID) 600 MG tablet Take 1 tablet (600 mg total) by mouth 2 (two) times daily before a meal.  . insulin NPH Human (HUMULIN N,NOVOLIN N) 100 UNIT/ML injection Inject 0.25 mLs (25 Units total) into the skin 2 (two) times daily before a meal.  . insulin regular (NOVOLIN R,HUMULIN R) 250 units/2.78m (100 units/mL) injection Inject 0-0.1 mLs (0-10 Units total) into the skin 2 (two) times daily before a meal. Sliding  scale  . levothyroxine (SYNTHROID, LEVOTHROID) 50 MCG tablet Take 1 tablet (50 mcg total) by mouth daily before breakfast.  . metFORMIN (GLUCOPHAGE) 1000 MG tablet Take 1 tablet (1,000 mg total) by mouth 2 (two) times daily with a meal.  . mirtazapine (REMERON) 45 MG tablet Take 1 tablet (45 mg total) by mouth at bedtime.  .Marland KitchenoxyCODONE-acetaminophen (ROXICET) 5-325 MG tablet Take 1 tablet by mouth every 6 (six) hours as needed for severe pain.  . Blood Glucose Monitoring Suppl w/Device KIT Test BS BID and PRN E11.9  . INSULIN SYRINGE 1CC/29G (B-D INSULIN SYRINGE) 29G X 1/2" 1 ML MISC Use with insulin up to QID PRN  . losartan (COZAAR) 25 MG tablet Take 1 tablet (25 mg total) by mouth daily. (Patient not taking: Reported on 11/12/2016)   No facility-administered encounter medications on file as of 11/12/2016.       Review of Systems  Constitutional: Negative.   Cardiovascular: Negative.   Musculoskeletal: Positive for joint swelling.  Neurological: Negative.        Objective:   Physical Exam  Constitutional: He appears well-developed and well-nourished.  Musculoskeletal:  Right wrist. There is tenderness at the base of the thumb in the region of the anatomic snuffbox. There is decreased strength with pincher grasp. X-ray shows no evidence of acute injury but there does appear some old injury in the body of the navicular.   BP 116/69   Pulse 78   Temp (!)  96.9 F (36.1 C) (Oral)   Ht 6' (1.829 m)   Wt 231 lb 9.6 oz (105.1 kg)   BMI 31.41 kg/m         Assessment & Plan:  1. Pain, wrist, right X-ray is negative. Presume radial tenosynovitis, possibly secondary to injury or strain. Will apply thumb spica splint for 1 week to wear 24 7. Withhold NSAIDs given his diabetes. If symptoms are not improved in one week return for re-x-ray  Wardell Honour MD - DG Wrist Navic Only Right; Future

## 2016-11-14 ENCOUNTER — Ambulatory Visit (INDEPENDENT_AMBULATORY_CARE_PROVIDER_SITE_OTHER): Payer: Medicare Other

## 2016-11-14 DIAGNOSIS — M25531 Pain in right wrist: Secondary | ICD-10-CM

## 2016-11-26 ENCOUNTER — Ambulatory Visit (INDEPENDENT_AMBULATORY_CARE_PROVIDER_SITE_OTHER): Payer: Medicare Other | Admitting: Family Medicine

## 2016-11-26 ENCOUNTER — Encounter: Payer: Self-pay | Admitting: Family Medicine

## 2016-11-26 VITALS — BP 119/74 | HR 71 | Temp 97.3°F | Ht 72.0 in | Wt 219.8 lb

## 2016-11-26 DIAGNOSIS — R55 Syncope and collapse: Secondary | ICD-10-CM

## 2016-11-26 DIAGNOSIS — Z794 Long term (current) use of insulin: Secondary | ICD-10-CM

## 2016-11-26 DIAGNOSIS — E1165 Type 2 diabetes mellitus with hyperglycemia: Secondary | ICD-10-CM

## 2016-11-26 MED ORDER — GEMFIBROZIL 600 MG PO TABS: 600.0000 mg | ORAL_TABLET | Freq: Two times a day (BID) | ORAL | 1 refills | Status: DC

## 2016-11-26 MED ORDER — INSULIN REGULAR HUMAN 100 UNIT/ML IJ SOLN: 0.0000 [IU] | Freq: Two times a day (BID) | INTRAMUSCULAR | 11 refills | Status: DC

## 2016-11-26 MED ORDER — FLUOXETINE HCL 40 MG PO CAPS: 40.0000 mg | ORAL_CAPSULE | Freq: Every day | ORAL | 1 refills | Status: DC

## 2016-11-26 MED ORDER — INSULIN NPH (HUMAN) (ISOPHANE) 100 UNIT/ML ~~LOC~~ SUSP: 25.0000 [IU] | Freq: Two times a day (BID) | SUBCUTANEOUS | 11 refills | Status: DC

## 2016-11-26 MED ORDER — OXYCODONE-ACETAMINOPHEN 5-325 MG PO TABS: 1.0000 | ORAL_TABLET | Freq: Four times a day (QID) | ORAL | 0 refills | Status: DC | PRN

## 2016-11-26 MED ORDER — LOSARTAN POTASSIUM 25 MG PO TABS: 25.0000 mg | ORAL_TABLET | Freq: Every day | ORAL | 0 refills | Status: DC

## 2016-11-26 MED ORDER — ALPRAZOLAM 0.5 MG PO TABS: 0.5000 mg | ORAL_TABLET | Freq: Every day | ORAL | 0 refills | Status: DC | PRN

## 2016-11-26 MED ORDER — GABAPENTIN 300 MG PO CAPS: 300.0000 mg | ORAL_CAPSULE | Freq: Two times a day (BID) | ORAL | 1 refills | Status: DC

## 2016-11-26 MED ORDER — METFORMIN HCL 1000 MG PO TABS: 1000.0000 mg | ORAL_TABLET | Freq: Two times a day (BID) | ORAL | 1 refills | Status: DC

## 2016-11-26 MED ORDER — LEVOTHYROXINE SODIUM 50 MCG PO TABS: 50.0000 ug | ORAL_TABLET | Freq: Every day | ORAL | 1 refills | Status: DC

## 2016-11-26 MED ORDER — MIRTAZAPINE 45 MG PO TABS: 45.0000 mg | ORAL_TABLET | Freq: Every day | ORAL | 1 refills | Status: DC

## 2016-11-26 NOTE — Progress Notes (Signed)
Subjective:    Patient ID: Jacob Bautista. [name], male    DOB: 07/27/1961, 56 y.o.   MRN: [id number]  HPI 56 year old gentleman here for one-month follow-up. Today he has a question about cardiology referral because he had seen on my chart that he had enlarged heart. In reviewing the records it was very mild and he is not having heart problems or issues and so I do not think he necessarily needs to see the cardiology. He did have a syncopal episode was evaluated in the emergency room at [facility]. He continues to have chronic back and hip pain. I think in general since getting out of prison he is adjusting and doing well.  Patient Active Problem List   Diagnosis Date Noted  . Syncope and collapse 10/14/2016  . Uncontrolled type 2 diabetes mellitus with hyperglycemia, with long-term current use of insulin ([facility]) 10/14/2016  . Lactic acidosis 10/14/2016  . AKI (acute kidney injury) ([facility]) 10/14/2016  . Hypotension   . Controlled type 2 diabetes with neuropathy ([facility]) 09/27/2016  . Essential hypertension, benign 09/27/2016  . Hypothyroidism 09/27/2016  . Bipolar disorder ([facility]) 09/27/2016  . BPH (benign prostatic hyperplasia) 09/27/2016  . Panic disorder 09/27/2016   Outpatient Encounter Prescriptions as of 11/26/2016  Medication Sig  . albuterol (PROVENTIL HFA;VENTOLIN HFA) 108 (90 Base) MCG/ACT inhaler Inhale 1-2 puffs into the lungs every 6 (six) hours as needed for wheezing or shortness of breath.  . ALPRAZolam (XANAX) 0.5 MG tablet Take 1 tablet (0.5 mg total) by mouth daily as needed for anxiety.  [name] aspirin EC 81 MG tablet Take 81 mg by mouth daily.  [name] FLUoxetine (PROZAC) 40 MG capsule Take 1 capsule (40 mg total) by mouth daily.  [name] gabapentin (NEURONTIN) 300 MG capsule Take 1 capsule (300 mg total) by mouth 2 (two) times daily.  [name] gemfibrozil (LOPID) 600 MG tablet Take 1 tablet (600 mg total) by mouth 2 (two) times daily before a meal.  . glucose blood (ONE TOUCH ULTRA TEST) test strip Use  twice daily to check blood sugars  . insulin NPH Human (HUMULIN N,NOVOLIN N) 100 UNIT/ML injection Inject 0.25 mLs (25 Units total) into the skin 2 (two) times daily before a meal.  . insulin regular (NOVOLIN R,HUMULIN R) 250 units/2.32mL (100 units/mL) injection Inject 0-0.1 mLs (0-10 Units total) into the skin 2 (two) times daily before a meal. Sliding scale  . INSULIN SYRINGE 1CC/29G (B-D INSULIN SYRINGE) 29G X 1/2" 1 ML MISC Use with insulin up to QID PRN  . Lancets (ONETOUCH ULTRASOFT) lancets Use twice daily to check blood sugars  . levothyroxine (SYNTHROID, LEVOTHROID) 50 MCG tablet Take 1 tablet (50 mcg total) by mouth daily before breakfast.  . losartan (COZAAR) 25 MG tablet Take 1 tablet (25 mg total) by mouth daily.  . metFORMIN (GLUCOPHAGE) 1000 MG tablet Take 1 tablet (1,000 mg total) by mouth 2 (two) times daily with a meal.  . mirtazapine (REMERON) 45 MG tablet Take 1 tablet (45 mg total) by mouth at bedtime.  [name] oxyCODONE-acetaminophen (ROXICET) 5-325 MG tablet Take 1 tablet by mouth every 6 (six) hours as needed for severe pain.   No facility-administered encounter medications on file as of 11/26/2016.       Review of Systems  Constitutional: Negative.   HENT: Negative.   Respiratory: Negative.   Cardiovascular: Negative.   Gastrointestinal: Negative.   Musculoskeletal: Positive for arthralgias and back pain.       Objective:   Physical Exam  Constitutional: He is oriented to person, place, and time. He appears well-developed and well-nourished.  Cardiovascular: Normal rate, regular rhythm and normal heart sounds.   Pulmonary/Chest: Effort normal and breath sounds normal.  Neurological: He is alert and oriented to person, place, and time.  Psychiatric: He has a normal mood and affect.   BP 119/74   Pulse 71   Temp 97.3 F (36.3 C) (Oral)   Ht 6' (1.829 m)   Wt 219 lb 12.8 oz (99.7 kg)   BMI 29.81 kg/m         Assessment & Plan:  1. Syncope and collapse No  further episodes since hospitalization. Etiology was felt to be dehydration although that is questionable because he had had no abnormal losses and was drinking and eating as per normal  2. Uncontrolled type 2 diabetes mellitus with hyperglycemia, with long-term current use of insulin ([facility]) In prison, treatment was spotty. Currently he is on metformin and regular and NPH insulin. Last A1c was 9.0 level over one month ago Frederica KusterStephen M Miller MD

## 2016-11-28 ENCOUNTER — Telehealth: Payer: Self-pay | Admitting: Family Medicine

## 2016-11-28 NOTE — Telephone Encounter (Signed)
I reviewed the notes and do not see any mention of any sinus infection. Please check with the patient and see what kind of symptoms he is having, if he is running fever and if he has had congestion etc. We could potentially call in some antibiotics if we feel like he does have a sinus infection.

## 2016-11-29 ENCOUNTER — Other Ambulatory Visit: Payer: Self-pay

## 2016-11-29 MED ORDER — AMOXICILLIN-POT CLAVULANATE 875-125 MG PO TABS: 1.0000 | ORAL_TABLET | Freq: Two times a day (BID) | ORAL | 0 refills | Status: DC

## 2016-11-29 NOTE — Telephone Encounter (Signed)
Please call and Augmentin 875 mg one twice daily with food for infection #20. If the patient does not get better after taking this he will need to come in and be seen. Also have him use nasal saline in each nostril 3 or 4 times daily. He can also pick up some Flonase over-the-counter do 1 spray each nostril at bedtime. Please make sure that he is not allergic to penicillin. According to the record he is not but always confirm this at a time.

## 2016-11-29 NOTE — Telephone Encounter (Signed)
LM for pt to call me with a list of symptoms and pharm

## 2016-11-29 NOTE — Telephone Encounter (Signed)
Confirmed that patient is not allergic to penicillin.  [facility]

## 2016-11-29 NOTE — Telephone Encounter (Signed)
Patient has sinus congestion and pressure, headache, sinus drainage that is causing a cough, feels like he has a sinus infection.  [facility]

## 2016-12-02 ENCOUNTER — Other Ambulatory Visit: Payer: Self-pay | Admitting: *Deleted

## 2016-12-02 MED ORDER — ONETOUCH DELICA LANCETS FINE MISC: 12 refills | Status: DC

## 2016-12-26 ENCOUNTER — Ambulatory Visit (INDEPENDENT_AMBULATORY_CARE_PROVIDER_SITE_OTHER): Payer: Medicare Other | Admitting: Family Medicine

## 2016-12-26 ENCOUNTER — Encounter: Payer: Self-pay | Admitting: Family Medicine

## 2016-12-26 VITALS — BP 122/82 | HR 75 | Temp 98.0°F | Ht 72.0 in | Wt 224.0 lb

## 2016-12-26 DIAGNOSIS — E1165 Type 2 diabetes mellitus with hyperglycemia: Secondary | ICD-10-CM | POA: Diagnosis not present

## 2016-12-26 DIAGNOSIS — E1140 Type 2 diabetes mellitus with diabetic neuropathy, unspecified: Secondary | ICD-10-CM

## 2016-12-26 DIAGNOSIS — F410 Panic disorder [episodic paroxysmal anxiety] without agoraphobia: Secondary | ICD-10-CM | POA: Diagnosis not present

## 2016-12-26 DIAGNOSIS — Z794 Long term (current) use of insulin: Secondary | ICD-10-CM

## 2016-12-26 MED ORDER — OXYCODONE-ACETAMINOPHEN 5-325 MG PO TABS
1.0000 | ORAL_TABLET | Freq: Four times a day (QID) | ORAL | 0 refills | Status: DC | PRN
Start: 2016-12-26 — End: 2017-01-29

## 2016-12-26 MED ORDER — INSULIN LISPRO 100 UNIT/ML ~~LOC~~ SOLN: 10.0000 [IU] | Freq: Three times a day (TID) | SUBCUTANEOUS | 11 refills | Status: DC

## 2016-12-26 MED ORDER — DULAGLUTIDE 1.5 MG/0.5ML ~~LOC~~ SOAJ: 1.5000 mg | SUBCUTANEOUS | 3 refills | Status: DC

## 2016-12-26 MED ORDER — ALPRAZOLAM 0.5 MG PO TABS: 0.5000 mg | ORAL_TABLET | Freq: Every day | ORAL | 0 refills | Status: DC | PRN

## 2016-12-26 MED ORDER — INSULIN GLARGINE 100 UNIT/ML ~~LOC~~ SOLN: 35.0000 [IU] | Freq: Every day | SUBCUTANEOUS | 11 refills | Status: DC

## 2016-12-26 NOTE — Progress Notes (Signed)
BP 122/82   Pulse 75   Temp 98 F (36.7 C) (Oral)   Ht 6' (1.829 m)   Wt 224 lb (101.6 kg)   BMI 30.38 kg/m    Subjective:    Patient ID: Jacob Bautista, male    DOB: 1961/09/05, 56 y.o.   MRN: [id number]  HPI: Jacob Bautista is a 56 y.o. male presenting on 12/26/2016 for Diabetes (followup) and Medication Refill (having issues with insurance company and albuterol inhaler, switching insurances on 5/1 and they will pay for Ventolin HFA but not Proventil)   HPI Type 2 diabetes mellitus Patient comes in today for recheck of his diabetes. Patient has been currently taking NPH and insulin R. He is also taking metformin 1000 twice a day. He is currently using 25 twice a day of NPH and 6-10 twice a day of insulin R.. Patient is currently on an Arb. Patient has not seen an ophthalmologist this year. Patient denies any issues with his feet. His blood sugar has been running in the 300 range consistently, he is not using the R if his blood sugar is good before he eats but is only using 6-10 currently right now with breakfast and dinner.  Panic disorder and anxiety recheck Patient is coming in for recheck of anxiety. He has been using Xanax twice a day, uses one to help with anxiety in one to help with sleep. He denies any suicidal ideations or thoughts of hurting himself. The Xanax is helping nicely to help with sleep.  Relevant past medical, surgical, family and social history reviewed and updated as indicated. Interim medical history since our last visit reviewed. Allergies and medications reviewed and updated.  Review of Systems  Constitutional: Negative for chills and fever.  Respiratory: Negative for shortness of breath and wheezing.   Cardiovascular: Negative for chest pain and leg swelling.  Musculoskeletal: Negative for back pain and gait problem.  Skin: Negative for rash.  Neurological: Negative for dizziness.  Psychiatric/Behavioral: Positive for sleep disturbance. Negative for  decreased concentration, dysphoric mood, self-injury and suicidal ideas. The patient is nervous/anxious.   All other systems reviewed and are negative.   Per HPI unless specifically indicated above   Allergies as of 12/26/2016   No Known Allergies     Medication List       Accurate as of 12/26/16 11:55 AM. Always use your most recent med list.          albuterol 108 (90 Base) MCG/ACT inhaler Commonly known as:  PROVENTIL HFA;VENTOLIN HFA Inhale 1-2 puffs into the lungs every 6 (six) hours as needed for wheezing or shortness of breath.   ALPRAZolam 0.5 MG tablet Commonly known as:  XANAX Take 1 tablet (0.5 mg total) by mouth daily as needed for anxiety.   aspirin EC 81 MG tablet Take 81 mg by mouth daily.   FLUoxetine 40 MG capsule Commonly known as:  PROZAC Take 1 capsule (40 mg total) by mouth daily.   gabapentin 300 MG capsule Commonly known as:  NEURONTIN Take 1 capsule (300 mg total) by mouth 2 (two) times daily.   gemfibrozil 600 MG tablet Commonly known as:  LOPID Take 1 tablet (600 mg total) by mouth 2 (two) times daily before a meal.   glucose blood test strip Commonly known as:  ONE TOUCH ULTRA TEST Use twice daily to check blood sugars   insulin glargine 100 UNIT/ML injection Commonly known as:  LANTUS Inject 0.35 mLs (35 Units total) into the  skin at bedtime.   insulin lispro 100 UNIT/ML injection Commonly known as:  HUMALOG Inject 0.1 mLs (10 Units total) into the skin 3 (three) times daily before meals.   INSULIN SYRINGE 1CC/29G 29G X 1/2" 1 ML Misc Commonly known as:  B-D INSULIN SYRINGE Use with insulin up to QID PRN   levothyroxine 50 MCG tablet Commonly known as:  SYNTHROID, LEVOTHROID Take 1 tablet (50 mcg total) by mouth daily before breakfast.   losartan 25 MG tablet Commonly known as:  COZAAR Take 1 tablet (25 mg total) by mouth daily.   metFORMIN 1000 MG tablet Commonly known as:  GLUCOPHAGE Take 1 tablet (1,000 mg total) by  mouth 2 (two) times daily with a meal.   mirtazapine 45 MG tablet Commonly known as:  REMERON Take 1 tablet (45 mg total) by mouth at bedtime.   ONETOUCH DELICA LANCETS FINE Misc Use twice daily to check blood sugars   oxyCODONE-acetaminophen 5-325 MG tablet Commonly known as:  ROXICET Take 1 tablet by mouth every 6 (six) hours as needed for severe pain.          Objective:    BP 122/82   Pulse 75   Temp 98 F (36.7 C) (Oral)   Ht 6' (1.829 m)   Wt 224 lb (101.6 kg)   BMI 30.38 kg/m   Wt Readings from Last 3 Encounters:  12/26/16 224 lb (101.6 kg)  11/26/16 219 lb 12.8 oz (99.7 kg)  11/12/16 231 lb 9.6 oz (105.1 kg)    Physical Exam  Constitutional: He is oriented to person, place, and time. He appears well-developed and well-nourished. No distress.  Eyes: Conjunctivae are normal. No scleral icterus.  Cardiovascular: Normal rate, regular rhythm, normal heart sounds and intact distal pulses.   No murmur heard. Pulmonary/Chest: Effort normal and breath sounds normal. No respiratory distress. He has no wheezes. He has no rales.  Musculoskeletal: Normal range of motion. He exhibits no edema.  Neurological: He is alert and oriented to person, place, and time. Coordination normal.  Skin: Skin is warm and dry. No rash noted. He is not diaphoretic.  Psychiatric: His behavior is normal. Thought content normal. His mood appears anxious. He does not exhibit a depressed mood. He expresses no suicidal ideation. He expresses no suicidal plans.  Nursing note and vitals reviewed.     Assessment & Plan:   Problem List Items Addressed This Visit      Endocrine   Type 2 diabetes mellitus with diabetic neuropathy, unspecified (HCC)   Relevant Medications   insulin glargine (LANTUS) 100 UNIT/ML injection   insulin lispro (HUMALOG) 100 UNIT/ML injection   Dulaglutide (TRULICITY) 1.5 MG/0.5ML SOPN   Uncontrolled type 2 diabetes mellitus with hyperglycemia, with long-term current use  of insulin (HCC) - Primary   Relevant Medications   insulin glargine (LANTUS) 100 UNIT/ML injection   insulin lispro (HUMALOG) 100 UNIT/ML injection   Dulaglutide (TRULICITY) 1.5 MG/0.5ML SOPN     Other   Panic disorder   Relevant Medications   ALPRAZolam (XANAX) 0.5 MG tablet       Follow up plan: Return in about 4 weeks (around 01/23/2017), or if symptoms worsen or fail to improve, for Recheck diabetes, also have him see Tammy for education.  Counseling provided for all of the vaccine components No orders of the defined types were placed in this encounter.   Arville Care, MD [facility] 12/26/2016, 11:55 AM

## 2017-01-06 ENCOUNTER — Other Ambulatory Visit: Payer: Self-pay | Admitting: Family Medicine

## 2017-01-29 ENCOUNTER — Encounter: Payer: Self-pay | Admitting: Family Medicine

## 2017-01-29 ENCOUNTER — Ambulatory Visit (INDEPENDENT_AMBULATORY_CARE_PROVIDER_SITE_OTHER): Payer: Medicare HMO | Admitting: Family Medicine

## 2017-01-29 VITALS — BP 127/89 | HR 76 | Temp 98.0°F | Ht 72.0 in | Wt 209.4 lb

## 2017-01-29 DIAGNOSIS — E1165 Type 2 diabetes mellitus with hyperglycemia: Secondary | ICD-10-CM

## 2017-01-29 DIAGNOSIS — E1140 Type 2 diabetes mellitus with diabetic neuropathy, unspecified: Secondary | ICD-10-CM | POA: Diagnosis not present

## 2017-01-29 DIAGNOSIS — M549 Dorsalgia, unspecified: Secondary | ICD-10-CM | POA: Diagnosis not present

## 2017-01-29 DIAGNOSIS — Z794 Long term (current) use of insulin: Secondary | ICD-10-CM | POA: Diagnosis not present

## 2017-01-29 DIAGNOSIS — F410 Panic disorder [episodic paroxysmal anxiety] without agoraphobia: Secondary | ICD-10-CM

## 2017-01-29 MED ORDER — OXYCODONE-ACETAMINOPHEN 5-325 MG PO TABS: 1.0000 | ORAL_TABLET | Freq: Three times a day (TID) | ORAL | 0 refills | Status: DC | PRN

## 2017-01-29 MED ORDER — DULOXETINE HCL 60 MG PO CPEP: 60.0000 mg | ORAL_CAPSULE | Freq: Every day | ORAL | 1 refills | Status: DC

## 2017-01-29 MED ORDER — ALBUTEROL SULFATE HFA 108 (90 BASE) MCG/ACT IN AERS: 1.0000 | INHALATION_SPRAY | RESPIRATORY_TRACT | 6 refills | Status: DC | PRN

## 2017-01-29 MED ORDER — ALPRAZOLAM 0.5 MG PO TABS: 0.5000 mg | ORAL_TABLET | Freq: Every day | ORAL | 1 refills | Status: DC | PRN

## 2017-01-29 NOTE — Progress Notes (Signed)
BP 127/89   Pulse 76   Temp 98 F (36.7 C) (Oral)   Ht 6' (1.829 m)   Wt 209 lb 6 oz (95 kg)   BMI 28.40 kg/m    Subjective:    Patient ID: Jacob Bautista, male    DOB: 07/31/1961, 56 y.o.   MRN: [id number]  HPI: Jacob Leylandlbert D. Gordy Bautista is a 56 y.o. male presenting on 01/29/2017 for Diabetes (1 month followup); Medication Refill (would like to increase dosage of Xanax, refill Ventolin HFA); and Handicap sticker (SOB at times)   HPI Type 2 diabetes mellitus Patient comes in today for recheck of his diabetes. Patient has been currently taking Metformin and Lantus and Humalog and Trulicity. Patient is currently on an ACE inhibitor. Patient has not seen an ophthalmologist this year. Patient denies any issues with his feet.   Chronic back pain refill medication Patient currently takes oxycodone 5-3 25 for back pain and is currently prescribed enough for 2 per day. Per patient and this is been doing well for him. He has had previous surgeries and physical therapy and imaging and knows that he has degenerative problems.  Panic disorder refill medications Patient is currently on Xanax and takes them as needed for anxiety. He currently gets 30 for a month but does not use one every day. He is coming in for refill today. He says is working pretty well for him. He denies any suicidal ideations or thoughts of hurting himself. He does admit to some components of depression but mostly he worries about the anxiety and the panic attacks.  Relevant past medical, surgical, family and social history reviewed and updated as indicated. Interim medical history since our last visit reviewed. Allergies and medications reviewed and updated.  Review of Systems  Constitutional: Negative for chills and fever.  Respiratory: Negative for shortness of breath and wheezing.   Cardiovascular: Negative for chest pain and leg swelling.  Musculoskeletal: Positive for back pain. Negative for gait problem.  Skin: Negative  for color change and rash.  Psychiatric/Behavioral: Positive for dysphoric mood. Negative for self-injury, sleep disturbance and suicidal ideas. The patient is nervous/anxious.   All other systems reviewed and are negative.   Per HPI unless specifically indicated above     Objective:    BP 127/89   Pulse 76   Temp 98 F (36.7 C) (Oral)   Ht 6' (1.829 m)   Wt 209 lb 6 oz (95 kg)   BMI 28.40 kg/m   Wt Readings from Last 3 Encounters:  01/29/17 209 lb 6 oz (95 kg)  12/26/16 224 lb (101.6 kg)  11/26/16 219 lb 12.8 oz (99.7 kg)    Physical Exam  Constitutional: He is oriented to person, place, and time. He appears well-developed and well-nourished. No distress.  Eyes: Conjunctivae are normal. No scleral icterus.  Neck: Neck supple. No thyromegaly present.  Cardiovascular: Normal rate, regular rhythm, normal heart sounds and intact distal pulses.   No murmur heard. Pulmonary/Chest: Effort normal and breath sounds normal. No respiratory distress. He has no wheezes.  Musculoskeletal: Normal range of motion. He exhibits no edema.       Lumbar back: He exhibits tenderness. He exhibits normal range of motion, no bony tenderness, no swelling, no deformity and normal pulse.  Lymphadenopathy:    He has no cervical adenopathy.  Neurological: He is alert and oriented to person, place, and time. Coordination normal.  Skin: Skin is warm and dry. No rash noted. He is not diaphoretic.  Psychiatric: His behavior is normal. His mood appears anxious. He exhibits a depressed mood.  Nursing note and vitals reviewed.     Assessment & Plan:   Problem List Items Addressed This Visit      Endocrine   Type 2 diabetes mellitus with diabetic neuropathy, unspecified (HCC)   Relevant Medications   losartan (COZAAR) 50 MG tablet   Uncontrolled type 2 diabetes mellitus with hyperglycemia, with long-term current use of insulin (HCC) - Primary   Relevant Medications   losartan (COZAAR) 50 MG tablet      Other   Panic disorder   Relevant Medications   ALPRAZolam (XANAX) 0.5 MG tablet   DULoxetine (CYMBALTA) 60 MG capsule    Other Visit Diagnoses    Back pain, unspecified back location, unspecified back pain laterality, unspecified chronicity       Relevant Medications   oxyCODONE-acetaminophen (ROXICET) 5-325 MG tablet   oxyCODONE-acetaminophen (ROXICET) 5-325 MG tablet       Follow up plan: Return in about 2 months (around 03/31/2017), or if symptoms worsen or fail to improve, for Diabetes and hypertension recheck.  Counseling provided for all of the vaccine components No orders of the defined types were placed in this encounter.   Arville Care, MD [facility] 01/29/2017, 12:05 PM

## 2017-02-04 ENCOUNTER — Other Ambulatory Visit: Payer: Self-pay | Admitting: *Deleted

## 2017-02-04 MED ORDER — GLUCOSE BLOOD VI STRP: ORAL_STRIP | 12 refills | Status: DC

## 2017-02-26 ENCOUNTER — Telehealth: Payer: Self-pay | Admitting: Family Medicine

## 2017-03-03 MED ORDER — BLOOD GLUCOSE MONITORING SUPPL DEVI: 1 refills | Status: DC

## 2017-03-03 MED ORDER — INSULIN REGULAR HUMAN 100 UNIT/ML IJ SOLN: 6.0000 [IU] | Freq: Two times a day (BID) | INTRAMUSCULAR | 11 refills | Status: DC

## 2017-03-03 NOTE — Telephone Encounter (Signed)
Pt aware of the change 

## 2017-03-03 NOTE — Telephone Encounter (Signed)
The last R insulin was sliding scale and BID -   DR D - You need to order the insulin please as we do not want to make a mistake.   I will order the monitor for you.

## 2017-03-03 NOTE — Telephone Encounter (Signed)
Go ahead and go back and send a prescription for insulin R which is what he had previously before the Humalog. Go ahead and call in true meter a pro-or what ever will be covered by his insurance and the strips to check 4 times daily. Arville CareJoshua Nelva Hauk, MD [facility] 03/03/2017, 12:49 PM

## 2017-03-03 NOTE — Telephone Encounter (Signed)
Please address

## 2017-03-07 ENCOUNTER — Telehealth: Payer: Self-pay | Admitting: Family Medicine

## 2017-03-07 NOTE — Telephone Encounter (Signed)
What is the name of the medication? mirtazatine 45mg   Have you contacted your pharmacy to request a refill? yes  Which pharmacy would you like this sent to? [facility] in [place].   Patient notified that their request is being sent to the clinical staff for review and that they should receive a call once it is complete. If they do not receive a call within 24 hours they can check with their pharmacy or our office.

## 2017-03-10 MED ORDER — MIRTAZAPINE 45 MG PO TABS: 45.0000 mg | ORAL_TABLET | Freq: Every day | ORAL | 1 refills | Status: DC

## 2017-03-10 NOTE — Telephone Encounter (Signed)
Okay to refill all meds for 6 mos 

## 2017-03-10 NOTE — Telephone Encounter (Signed)
Covering for [place]tinger 

## 2017-03-10 NOTE — Telephone Encounter (Signed)
Rx sent in

## 2017-04-03 ENCOUNTER — Ambulatory Visit (INDEPENDENT_AMBULATORY_CARE_PROVIDER_SITE_OTHER): Payer: Medicare HMO | Admitting: Family Medicine

## 2017-04-03 ENCOUNTER — Encounter: Payer: Self-pay | Admitting: Family Medicine

## 2017-04-03 VITALS — BP 125/77 | HR 85 | Temp 97.4°F | Ht 72.0 in | Wt 210.0 lb

## 2017-04-03 DIAGNOSIS — E1165 Type 2 diabetes mellitus with hyperglycemia: Secondary | ICD-10-CM | POA: Diagnosis not present

## 2017-04-03 DIAGNOSIS — E039 Hypothyroidism, unspecified: Secondary | ICD-10-CM | POA: Diagnosis not present

## 2017-04-03 DIAGNOSIS — N529 Male erectile dysfunction, unspecified: Secondary | ICD-10-CM

## 2017-04-03 DIAGNOSIS — F410 Panic disorder [episodic paroxysmal anxiety] without agoraphobia: Secondary | ICD-10-CM

## 2017-04-03 DIAGNOSIS — I10 Essential (primary) hypertension: Secondary | ICD-10-CM

## 2017-04-03 DIAGNOSIS — E1140 Type 2 diabetes mellitus with diabetic neuropathy, unspecified: Secondary | ICD-10-CM

## 2017-04-03 DIAGNOSIS — Z794 Long term (current) use of insulin: Secondary | ICD-10-CM

## 2017-04-03 MED ORDER — OXYCODONE-ACETAMINOPHEN 5-325 MG PO TABS: 1.0000 | ORAL_TABLET | Freq: Three times a day (TID) | ORAL | 0 refills | Status: DC | PRN

## 2017-04-03 MED ORDER — INSULIN GLARGINE 100 UNIT/ML ~~LOC~~ SOLN: 45.0000 [IU] | Freq: Every day | SUBCUTANEOUS | 11 refills | Status: DC

## 2017-04-03 MED ORDER — LEVOTHYROXINE SODIUM 50 MCG PO TABS: 50.0000 ug | ORAL_TABLET | Freq: Every day | ORAL | 1 refills | Status: DC

## 2017-04-03 MED ORDER — MIRTAZAPINE 45 MG PO TABS
45.0000 mg | ORAL_TABLET | Freq: Every day | ORAL | 1 refills | Status: DC
Start: 2017-04-03 — End: 2017-07-16

## 2017-04-03 MED ORDER — ALPRAZOLAM 0.5 MG PO TABS: 0.5000 mg | ORAL_TABLET | Freq: Every day | ORAL | 2 refills | Status: DC | PRN

## 2017-04-03 MED ORDER — ALBUTEROL SULFATE HFA 108 (90 BASE) MCG/ACT IN AERS: 1.0000 | INHALATION_SPRAY | RESPIRATORY_TRACT | 6 refills | Status: DC | PRN

## 2017-04-03 MED ORDER — INSULIN REGULAR HUMAN 100 UNIT/ML IJ SOLN: 6.0000 [IU] | Freq: Two times a day (BID) | INTRAMUSCULAR | 11 refills | Status: DC

## 2017-04-03 MED ORDER — GEMFIBROZIL 600 MG PO TABS: 600.0000 mg | ORAL_TABLET | Freq: Two times a day (BID) | ORAL | 1 refills | Status: DC

## 2017-04-03 MED ORDER — GABAPENTIN 300 MG PO CAPS: 300.0000 mg | ORAL_CAPSULE | Freq: Two times a day (BID) | ORAL | 1 refills | Status: DC

## 2017-04-03 MED ORDER — LISINOPRIL 5 MG PO TABS: 5.0000 mg | ORAL_TABLET | Freq: Every day | ORAL | 3 refills | Status: DC

## 2017-04-03 MED ORDER — METFORMIN HCL 1000 MG PO TABS: 1000.0000 mg | ORAL_TABLET | Freq: Two times a day (BID) | ORAL | 1 refills | Status: DC

## 2017-04-03 MED ORDER — DULOXETINE HCL 60 MG PO CPEP: 60.0000 mg | ORAL_CAPSULE | Freq: Every day | ORAL | 1 refills | Status: DC

## 2017-04-03 NOTE — Progress Notes (Signed)
BP 125/77   Pulse 85   Temp (!) 97.4 F (36.3 C) (Oral)   Ht 6' (1.829 m)   Wt 210 lb (95.3 kg)   BMI 28.48 kg/m    Subjective:    Patient ID: Jacob Bautista, male    DOB: 1960-09-27, 56 y.o.   MRN: [id number]  HPI: Jacob Bautista is a 56 y.o. male presenting on 04/03/2017 for Diabetes (followup; patient discontinued Trulicity because it was causing nausea and vomiting) and Hypertension (one episode of hypotension - 76/44)   HPI Type 2 diabetes mellitus Patient comes in today for recheck of his diabetes. Patient has been currently taking metformin, and lantus, and insulin R. Patient is currently on an ACE inhibitor/ARB. Patient has not seen an ophthalmologist this year. Patient has neuropathy but denies any issues with her feet.   Hypothyroidism recheck Patient is coming in for thyroid recheck today as well. They deny any issues with hair changes or heat or cold problems or diarrhea or constipation. They deny any chest pain or palpitations. They are currently on levothyroxine 50 micrograms   Hypertension Patient is currently on lisinopril, was on losartan but will discontinue the losartan because of dose being too high, and their blood pressure today is 125/77 but at home has run as low as 76/44. Patient has occasional lightheadedness and dizziness. Patient denies headaches, blurred vision, chest pains, shortness of breath, or weakness. Denies any side effects from medication and is content with current medication.   Erectile dysfunction Patient has been having erectile dysfunction problems and we discussed risks and benefits of taking the medication for it and he would like to go discuss other options with urology, he has heard about penile pumps and that is an option that he would like to discuss with them if possible. We'll go ahead and do the referral for him. He says mostly it is getting an erection but once he gets an erection he can finish but just cannot get it very easily  anymore. With his diabetes and circulatory issues this is likely related to complications from that. We will try and get his diabetes under better control which should help things as well.  Anxiety and panic disorder Patient needs refill medications for anxiety/panic disorder that he has been on for some time. We are trying to reduce the frequency of taking his benzodiazepine and have started him on Cymbalta to try and help with that. He denies any suicidal ideations or thoughts of hurting himself. He does say that the anxiety/panic comes up almost every day.  Relevant past medical, surgical, family and social history reviewed and updated as indicated. Interim medical history since our last visit reviewed. Allergies and medications reviewed and updated.  Review of Systems  Constitutional: Negative for chills and fever.  Eyes: Negative for discharge.  Respiratory: Negative for shortness of breath and wheezing.   Cardiovascular: Negative for chest pain and leg swelling.  Gastrointestinal: Negative for abdominal pain.  Genitourinary: Negative for decreased urine volume, difficulty urinating, frequency, penile pain, penile swelling, scrotal swelling and testicular pain.  Musculoskeletal: Negative for back pain and gait problem.  Skin: Negative for rash.  Neurological: Positive for numbness. Negative for dizziness, weakness, light-headedness and headaches.  Psychiatric/Behavioral: Negative for decreased concentration, dysphoric mood, self-injury, sleep disturbance and suicidal ideas. The patient is nervous/anxious.   All other systems reviewed and are negative.   Per HPI unless specifically indicated above     Objective:    BP 125/77  Pulse 85   Temp (!) 97.4 F (36.3 C) (Oral)   Ht 6' (1.829 m)   Wt 210 lb (95.3 kg)   BMI 28.48 kg/m   Wt Readings from Last 3 Encounters:  04/03/17 210 lb (95.3 kg)  01/29/17 209 lb 6 oz (95 kg)  12/26/16 224 lb (101.6 kg)    Physical Exam    Constitutional: He is oriented to person, place, and time. He appears well-developed and well-nourished. No distress.  Eyes: Conjunctivae are normal. No scleral icterus.  Neck: Neck supple. No thyromegaly present.  Cardiovascular: Normal rate, regular rhythm, normal heart sounds and intact distal pulses.   No murmur heard. Pulmonary/Chest: Effort normal and breath sounds normal. No respiratory distress. He has no wheezes. He has no rales.  Musculoskeletal: Normal range of motion. He exhibits no edema.  Lymphadenopathy:    He has no cervical adenopathy.  Neurological: He is alert and oriented to person, place, and time. Coordination normal.  Skin: Skin is warm and dry. No rash noted. He is not diaphoretic.  Psychiatric: His behavior is normal. His mood appears anxious. He does not exhibit a depressed mood. He expresses no suicidal ideation. He expresses no suicidal plans.  Nursing note and vitals reviewed.     Assessment & Plan:   Problem List Items Addressed This Visit      Cardiovascular and Mediastinum   Essential hypertension, benign   Relevant Medications   gemfibrozil (LOPID) 600 MG tablet   lisinopril (PRINIVIL,ZESTRIL) 5 MG tablet   Other Relevant Orders   CMP14+EGFR (Completed)   Lipid panel (Completed)     Endocrine   Type 2 diabetes mellitus with diabetic neuropathy, unspecified (HCC)   Relevant Medications   oxyCODONE-acetaminophen (ROXICET) 5-325 MG tablet   oxyCODONE-acetaminophen (ROXICET) 5-325 MG tablet   oxyCODONE-acetaminophen (ROXICET) 5-325 MG tablet   insulin glargine (LANTUS) 100 UNIT/ML injection   insulin regular (NOVOLIN R,HUMULIN R) 100 units/mL injection   metFORMIN (GLUCOPHAGE) 1000 MG tablet   DULoxetine (CYMBALTA) 60 MG capsule   gabapentin (NEURONTIN) 300 MG capsule   lisinopril (PRINIVIL,ZESTRIL) 5 MG tablet   Other Relevant Orders   Bayer DCA Hb A1c Waived (Completed)   CMP14+EGFR (Completed)   Lipid panel (Completed)   Hypothyroidism    Relevant Medications   levothyroxine (SYNTHROID, LEVOTHROID) 50 MCG tablet   Uncontrolled type 2 diabetes mellitus with hyperglycemia, with long-term current use of insulin (HCC) - Primary   Relevant Medications   insulin glargine (LANTUS) 100 UNIT/ML injection   insulin regular (NOVOLIN R,HUMULIN R) 100 units/mL injection   metFORMIN (GLUCOPHAGE) 1000 MG tablet   lisinopril (PRINIVIL,ZESTRIL) 5 MG tablet   Other Relevant Orders   Bayer DCA Hb A1c Waived (Completed)   CMP14+EGFR (Completed)   Lipid panel (Completed)     Other   Panic disorder   Relevant Medications   ALPRAZolam (XANAX) 0.5 MG tablet    Other Visit Diagnoses    Vasculogenic erectile dysfunction, unspecified vasculogenic erectile dysfunction type       Relevant Orders   Ambulatory referral to Urology       Follow up plan: Return in about 3 months (around 07/04/2017), or if symptoms worsen or fail to improve, for Recheck diabetes.  Counseling provided for all of the vaccine components Orders Placed This Encounter  Procedures  . Bayer DCA Hb A1c Waived  . CMP14+EGFR  . Lipid panel  . Ambulatory referral to Urology    Caryl Pina, MD [facility] 04/03/2017, 11:45 AM

## 2017-04-04 LAB — CMP14+EGFR
A/G RATIO: 1.8 (ref 1.2–2.2)
ALBUMIN: 4.6 g/dL (ref 3.5–5.5)
ALT: 56 IU/L — ABNORMAL HIGH (ref 0–44)
AST: 92 IU/L — AB (ref 0–40)
Alkaline Phosphatase: 87 IU/L (ref 39–117)
BUN / CREAT RATIO: 11 (ref 9–20)
BUN: 10 mg/dL (ref 6–24)
Bilirubin Total: 0.5 mg/dL (ref 0.0–1.2)
CALCIUM: 10 mg/dL (ref 8.7–10.2)
CO2: 22 mmol/L (ref 20–29)
CREATININE: 0.87 mg/dL (ref 0.76–1.27)
Chloride: 97 mmol/L (ref 96–106)
GFR, EST AFRICAN AMERICAN: 112 mL/min/{1.73_m2} (ref >59)
GFR, EST NON AFRICAN AMERICAN: 96 mL/min/{1.73_m2} (ref >59)
GLOBULIN, TOTAL: 2.5 g/dL (ref 1.5–4.5)
Glucose: 183 mg/dL — ABNORMAL HIGH (ref 65–99)
POTASSIUM: 4.7 mmol/L (ref 3.5–5.2)
SODIUM: 137 mmol/L (ref 134–144)
TOTAL PROTEIN: 7.1 g/dL (ref 6.0–8.5)

## 2017-04-04 LAB — BAYER DCA HB A1C WAIVED: HB A1C (BAYER DCA - WAIVED): 9 % — ABNORMAL HIGH (ref <7.0)

## 2017-04-04 LAB — LIPID PANEL
CHOL/HDL RATIO: 4.5 ratio (ref 0.0–5.0)
Cholesterol, Total: 143 mg/dL (ref 100–199)
HDL: 32 mg/dL — ABNORMAL LOW (ref >39)
LDL CALC: 79 mg/dL (ref 0–99)
Triglycerides: 162 mg/dL — ABNORMAL HIGH (ref 0–149)
VLDL Cholesterol Cal: 32 mg/dL (ref 5–40)

## 2017-04-08 ENCOUNTER — Telehealth: Payer: Self-pay | Admitting: Family Medicine

## 2017-04-08 NOTE — Telephone Encounter (Signed)
Left detailed message for pt regarding RXs RXs were sent in on 04/03/2017 and  this was confirmed with pharmacy

## 2017-04-08 NOTE — Telephone Encounter (Signed)
What is the name of the medication? Mirtazapine 45mg . Gabapentin 300mg . Lantus. Test strips.  Have you contacted your pharmacy to request a refill? No. It was to be filled at last appt.  Which pharmacy would you like this sent to? [facility] in [place].   Patient notified that their request is being sent to the clinical staff for review and that they should receive a call once it is complete. If they do not receive a call within 24 hours they can check with their pharmacy or our office.

## 2017-04-17 ENCOUNTER — Ambulatory Visit: Payer: Medicare HMO | Admitting: Family Medicine

## 2017-04-18 ENCOUNTER — Encounter: Payer: Self-pay | Admitting: Family Medicine

## 2017-07-07 ENCOUNTER — Telehealth: Payer: Self-pay | Admitting: Family Medicine

## 2017-07-07 ENCOUNTER — Encounter: Payer: Self-pay | Admitting: Family Medicine

## 2017-07-07 ENCOUNTER — Ambulatory Visit (INDEPENDENT_AMBULATORY_CARE_PROVIDER_SITE_OTHER): Payer: Medicare HMO | Admitting: Family Medicine

## 2017-07-07 VITALS — BP 133/78 | HR 78 | Temp 96.9°F | Ht 72.0 in | Wt 208.0 lb

## 2017-07-07 DIAGNOSIS — R945 Abnormal results of liver function studies: Secondary | ICD-10-CM | POA: Diagnosis not present

## 2017-07-07 DIAGNOSIS — Z23 Encounter for immunization: Secondary | ICD-10-CM

## 2017-07-07 DIAGNOSIS — F410 Panic disorder [episodic paroxysmal anxiety] without agoraphobia: Secondary | ICD-10-CM | POA: Diagnosis not present

## 2017-07-07 DIAGNOSIS — E039 Hypothyroidism, unspecified: Secondary | ICD-10-CM | POA: Diagnosis not present

## 2017-07-07 DIAGNOSIS — Z794 Long term (current) use of insulin: Secondary | ICD-10-CM | POA: Diagnosis not present

## 2017-07-07 DIAGNOSIS — E1159 Type 2 diabetes mellitus with other circulatory complications: Secondary | ICD-10-CM

## 2017-07-07 DIAGNOSIS — R7989 Other specified abnormal findings of blood chemistry: Secondary | ICD-10-CM

## 2017-07-07 DIAGNOSIS — E1165 Type 2 diabetes mellitus with hyperglycemia: Secondary | ICD-10-CM | POA: Diagnosis not present

## 2017-07-07 DIAGNOSIS — E1140 Type 2 diabetes mellitus with diabetic neuropathy, unspecified: Secondary | ICD-10-CM

## 2017-07-07 DIAGNOSIS — I10 Essential (primary) hypertension: Secondary | ICD-10-CM | POA: Diagnosis not present

## 2017-07-07 LAB — BAYER DCA HB A1C WAIVED: HB A1C: 10.6 % — AB (ref <7.0)

## 2017-07-07 MED ORDER — OXYCODONE-ACETAMINOPHEN 5-325 MG PO TABS: 1.0000 | ORAL_TABLET | Freq: Three times a day (TID) | ORAL | 0 refills | Status: DC | PRN

## 2017-07-07 MED ORDER — ALPRAZOLAM 0.5 MG PO TABS: 0.5000 mg | ORAL_TABLET | Freq: Every day | ORAL | 2 refills | Status: DC | PRN

## 2017-07-07 MED ORDER — INSULIN GLARGINE 100 UNIT/ML ~~LOC~~ SOLN: 45.0000 [IU] | Freq: Every day | SUBCUTANEOUS | 11 refills | Status: DC

## 2017-07-07 MED ORDER — INSULIN REGULAR HUMAN 100 UNIT/ML IJ SOLN: 6.0000 [IU] | Freq: Two times a day (BID) | INTRAMUSCULAR | 11 refills | Status: DC

## 2017-07-07 MED ORDER — EMPAGLIFLOZIN 10 MG PO TABS: 10.0000 mg | ORAL_TABLET | Freq: Every day | ORAL | 0 refills | Status: DC

## 2017-07-07 NOTE — Progress Notes (Signed)
BP 133/78   Pulse 78   Temp (!) 96.9 F (36.1 C) (Oral)   Ht 6' (1.829 m)   Wt 208 lb (94.3 kg)   BMI 28.21 kg/m    Subjective:    Patient ID: Jacob Bautista, male    DOB: [birth year], 56 y.o.   MRN: [id number]  HPI: Jacob Bautista is a 56 y.o. male presenting on 07/07/2017 for Hypothyroidism; Diabetes (insurance will not cover Humalog, will cover Novolog); Hypertension; and Panic Attack (wants ref to psych, prefers somewhere close to [place])   HPI Type 2 diabetes mellitus Patient comes in today for recheck of his diabetes. Patient has been currently taking regular insulin, 6-10 units 2 times daily before meals, patient only eats 2 meals a day.  Lantus 45 units and metformin. Patient is currently on an ACE inhibitor/ARB. Patient has not seen an ophthalmologist this year. Patient does have neuropathy in his feet and we are trying the Cymbalta but he also currently takes oxycodone for this.  He does feel like the Cymbalta is helping a lot but would like to continue trying it for now.   Hypothyroidism recheck Patient is coming in for thyroid recheck today as well. They deny any issues with hair changes or heat or cold problems or diarrhea or constipation. They deny any chest pain or palpitations. They are currently on levothyroxine 53mcrograms   Hypertension Patient is currently on lisinopril 5 mg, he is likely only on this for renal protection, and their blood pressure today is 133/78. Patient denies any lightheadedness or dizziness. Patient denies headaches, blurred vision, chest pains, shortness of breath, or weakness. Denies any side effects from medication and is content with current medication.   Panic disorder and panic attacks Patient comes in with complaints of panic attacks and wants a refill of his medications.  Patient was instructed that we will be tapering them likely off of 1 of his medications because he is currently taking oxycodone and alprazolam and warned about the  interactions of that.  He was started on Cymbalta and we are trying to help him reduce the alprazolam by having a maintenance therapy such as Cymbalta.  He denies any suicidal ideations or thoughts of hurting himself. [facility] 07/07/2017 04/03/2017 01/29/2017 12/26/2016 11/26/2016  Decreased Interest _0 Down, Depressed, Hopeless _1 PHQ - 2 Score _2 Altered sleeping _3 Tired, decreased energy _4 Change in appetite _5 Feeling bad or failure about yourself  _6 Trouble concentrating _7 Moving slowly or fidgety/restless _8 0 1  Suicidal thoughts 0 0 0 0 0  PHQ-9 Score _9 Difficult doing work/chores Somewhat difficult Somewhat difficult Somewhat difficult Somewhat difficult -    Patient had elevated liver function test needs to have it rechecked.  Relevant past medical, surgical, family and social history reviewed and updated as indicated. Interim medical history since our last visit reviewed. Allergies and medications reviewed and updated.  Review of Systems  Constitutional: Negative for chills and fever.  Eyes: Negative for discharge.  Respiratory: Negative for shortness of breath and wheezing.   Cardiovascular: Negative for chest pain and leg swelling.  Skin: Negative for rash.  Neurological: Negative for dizziness, weakness, light-headedness, numbness and headaches.  Psychiatric/Behavioral: Positive  for decreased concentration and dysphoric mood. Negative for self-injury, sleep disturbance and suicidal ideas. The patient is nervous/anxious.   All other systems reviewed and are negative.   Per HPI unless specifically indicated above     Objective:    BP 133/78   Pulse 78   Temp (!) 96.9 F (36.1 C) (Oral)   Ht 6' (1.829 m)   Wt 208 lb (94.3 kg)   BMI 28.21 kg/m   Wt Readings from Last 3 Encounters:  07/07/17 208 lb (94.3 kg)  04/03/17 210 lb (95.3 kg)  01/29/17 209 lb 6 oz (95 kg)      Physical Exam  Constitutional: He is oriented to person, place, and time. He appears well-developed and well-nourished. No distress.  Eyes: Conjunctivae are normal. No scleral icterus.  Neck: Neck supple. No thyromegaly present.  Cardiovascular: Normal rate, regular rhythm, normal heart sounds and intact distal pulses.   No murmur heard. Pulmonary/Chest: Effort normal and breath sounds normal. No respiratory distress. He has no wheezes. He has no rales.  Abdominal: Soft. Bowel sounds are normal. He exhibits no distension. There is no hepatosplenomegaly. There is no tenderness. There is no rebound and no CVA tenderness.  Musculoskeletal: Normal range of motion. He exhibits no edema.  Lymphadenopathy:    He has no cervical adenopathy.  Neurological: He is alert and oriented to person, place, and time. Coordination normal.  Skin: Skin is warm and dry. No rash noted. He is not diaphoretic.  Psychiatric: His behavior is normal. His mood appears anxious. He exhibits a depressed mood. He expresses no suicidal ideation. He expresses no suicidal plans.  Nursing note and vitals reviewed.       Assessment & Plan:   Problem List Items Addressed This Visit      Cardiovascular and Mediastinum   Hypertension associated with diabetes ([place])   Relevant Medications   insulin glargine (LANTUS) 100 UNIT/ML injection   insulin regular (NOVOLIN R,HUMULIN R) 100 units/mL injection   empagliflozin (JARDIANCE) 10 MG TABS tablet   Other Relevant Orders   CMP14+EGFR (Completed)   Lipid panel (Completed)     Endocrine   Type 2 diabetes mellitus with diabetic neuropathy, unspecified (HCC)   Relevant Medications   oxyCODONE-acetaminophen (ROXICET) 5-325 MG tablet   oxyCODONE-acetaminophen (ROXICET) 5-325 MG tablet   oxyCODONE-acetaminophen (ROXICET) 5-325 MG tablet   insulin glargine (LANTUS) 100 UNIT/ML injection   insulin regular (NOVOLIN R,HUMULIN R) 100 units/mL injection   empagliflozin  (JARDIANCE) 10 MG TABS tablet   Other Relevant Orders   CMP14+EGFR (Completed)   Bayer DCA Hb A1c Waived (Completed)   Hypothyroidism   Uncontrolled type 2 diabetes mellitus with hyperglycemia, with long-term current use of insulin (HCC) - Primary   Relevant Medications   insulin glargine (LANTUS) 100 UNIT/ML injection   insulin regular (NOVOLIN R,HUMULIN R) 100 units/mL injection   empagliflozin (JARDIANCE) 10 MG TABS tablet   Other Relevant Orders   CMP14+EGFR (Completed)   Bayer DCA Hb A1c Waived (Completed)     Other   Panic disorder   Relevant Medications   ALPRAZolam (XANAX) 0.5 MG tablet    Other Visit Diagnoses    Panic attack       Relevant Medications   ALPRAZolam (XANAX) 0.5 MG tablet   Other Relevant Orders   Ambulatory referral to Psychiatry   Elevated liver function tests       Relevant Orders   CMP14+EGFR (Completed)   Need for immunization against influenza  Relevant Orders   Flu Vaccine QUAD 36+ mos IM (Completed)       Follow up plan: Return in about 3 months (around 10/07/2017), or if symptoms worsen or fail to improve, for Pain and diabetes.  Counseling provided for all of the vaccine components Orders Placed This Encounter  Procedures  . CMP14+EGFR  . Lipid panel  . Bayer DCA Hb A1c Waived  . Ambulatory referral to Psychiatry    Caryl Pina, MD [facility] 07/07/2017, 12:31 PM

## 2017-07-08 LAB — LIPID PANEL
CHOLESTEROL TOTAL: 147 mg/dL (ref 100–199)
Chol/HDL Ratio: 5.3 ratio — ABNORMAL HIGH (ref 0.0–5.0)
HDL: 28 mg/dL — ABNORMAL LOW (ref >39)
LDL CALC: 68 mg/dL (ref 0–99)
Triglycerides: 254 mg/dL — ABNORMAL HIGH (ref 0–149)
VLDL Cholesterol Cal: 51 mg/dL — ABNORMAL HIGH (ref 5–40)

## 2017-07-08 LAB — CMP14+EGFR
ALBUMIN: 4.4 g/dL (ref 3.5–5.5)
ALK PHOS: 91 IU/L (ref 39–117)
ALT: 34 IU/L (ref 0–44)
AST: 49 IU/L — ABNORMAL HIGH (ref 0–40)
Albumin/Globulin Ratio: 1.8 (ref 1.2–2.2)
BUN / CREAT RATIO: 24 — AB (ref 9–20)
BUN: 19 mg/dL (ref 6–24)
CO2: 20 mmol/L (ref 20–29)
CREATININE: 0.79 mg/dL (ref 0.76–1.27)
Calcium: 9.9 mg/dL (ref 8.7–10.2)
Chloride: 97 mmol/L (ref 96–106)
GFR calc Af Amer: 116 mL/min/{1.73_m2} (ref >59)
GFR calc non Af Amer: 100 mL/min/{1.73_m2} (ref >59)
GLOBULIN, TOTAL: 2.5 g/dL (ref 1.5–4.5)
Glucose: 371 mg/dL — ABNORMAL HIGH (ref 65–99)
Potassium: 4.7 mmol/L (ref 3.5–5.2)
SODIUM: 139 mmol/L (ref 134–144)
Total Protein: 6.9 g/dL (ref 6.0–8.5)

## 2017-07-08 NOTE — Telephone Encounter (Signed)
Pt instructed to contact pharmacy for medications Pt verbalizes understanding

## 2017-07-16 ENCOUNTER — Other Ambulatory Visit: Payer: Self-pay | Admitting: *Deleted

## 2017-07-16 MED ORDER — MIRTAZAPINE 45 MG PO TABS: 45.0000 mg | ORAL_TABLET | Freq: Every day | ORAL | 1 refills | Status: DC

## 2017-07-24 ENCOUNTER — Other Ambulatory Visit: Payer: Self-pay | Admitting: Family Medicine

## 2017-07-24 MED ORDER — ACCU-CHEK SOFT TOUCH LANCETS MISC: 12 refills | Status: DC

## 2017-07-24 MED ORDER — GLUCOSE BLOOD VI STRP: ORAL_STRIP | 12 refills | Status: DC

## 2017-07-24 NOTE — Telephone Encounter (Signed)
Test strips and lancets sent to pharm - pt aware

## 2017-07-24 NOTE — Telephone Encounter (Signed)
[facility]  -jhb

## 2017-07-25 ENCOUNTER — Other Ambulatory Visit: Payer: Self-pay | Admitting: Family Medicine

## 2017-09-24 ENCOUNTER — Ambulatory Visit: Payer: Medicare Other | Admitting: Urology

## 2017-10-09 ENCOUNTER — Encounter: Payer: Self-pay | Admitting: Family Medicine

## 2017-10-09 ENCOUNTER — Ambulatory Visit (INDEPENDENT_AMBULATORY_CARE_PROVIDER_SITE_OTHER): Payer: Medicare HMO | Admitting: Family Medicine

## 2017-10-09 VITALS — BP 131/82 | HR 74 | Temp 97.1°F | Ht 72.0 in | Wt 212.5 lb

## 2017-10-09 DIAGNOSIS — E1165 Type 2 diabetes mellitus with hyperglycemia: Secondary | ICD-10-CM

## 2017-10-09 DIAGNOSIS — I10 Essential (primary) hypertension: Secondary | ICD-10-CM | POA: Diagnosis not present

## 2017-10-09 DIAGNOSIS — F410 Panic disorder [episodic paroxysmal anxiety] without agoraphobia: Secondary | ICD-10-CM | POA: Diagnosis not present

## 2017-10-09 DIAGNOSIS — I152 Hypertension secondary to endocrine disorders: Secondary | ICD-10-CM

## 2017-10-09 DIAGNOSIS — R748 Abnormal levels of other serum enzymes: Secondary | ICD-10-CM | POA: Diagnosis not present

## 2017-10-09 DIAGNOSIS — E1140 Type 2 diabetes mellitus with diabetic neuropathy, unspecified: Secondary | ICD-10-CM | POA: Diagnosis not present

## 2017-10-09 DIAGNOSIS — E1159 Type 2 diabetes mellitus with other circulatory complications: Secondary | ICD-10-CM

## 2017-10-09 DIAGNOSIS — Z794 Long term (current) use of insulin: Secondary | ICD-10-CM | POA: Diagnosis not present

## 2017-10-09 DIAGNOSIS — E039 Hypothyroidism, unspecified: Secondary | ICD-10-CM | POA: Diagnosis not present

## 2017-10-09 DIAGNOSIS — B182 Chronic viral hepatitis C: Secondary | ICD-10-CM | POA: Diagnosis not present

## 2017-10-09 LAB — BAYER DCA HB A1C WAIVED: HB A1C (BAYER DCA - WAIVED): 9.6 % — ABNORMAL HIGH (ref <7.0)

## 2017-10-09 MED ORDER — OXYCODONE-ACETAMINOPHEN 5-325 MG PO TABS: 1.0000 | ORAL_TABLET | Freq: Three times a day (TID) | ORAL | 0 refills | Status: DC | PRN

## 2017-10-09 MED ORDER — MIRTAZAPINE 45 MG PO TABS: 45.0000 mg | ORAL_TABLET | Freq: Every day | ORAL | 1 refills | Status: DC

## 2017-10-09 MED ORDER — ALPRAZOLAM 0.5 MG PO TABS: 0.5000 mg | ORAL_TABLET | Freq: Every day | ORAL | 2 refills | Status: DC | PRN
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
## 2017-10-10 ENCOUNTER — Other Ambulatory Visit: Payer: Self-pay | Admitting: Family Medicine

## 2017-10-10 ENCOUNTER — Other Ambulatory Visit: Payer: Self-pay | Admitting: *Deleted

## 2017-10-10 DIAGNOSIS — R768 Other specified abnormal immunological findings in serum: Secondary | ICD-10-CM

## 2017-10-10 LAB — HEPATITIS PANEL, ACUTE
Hep A IgM: NEGATIVE
Hep B C IgM: NEGATIVE
Hep C Virus Ab: >11 s/co ratio — ABNORMAL HIGH (ref 0.0–0.9)
Hepatitis B Surface Ag: NEGATIVE

## 2017-10-10 LAB — CMP14+EGFR
A/G RATIO: 1.8 (ref 1.2–2.2)
ALT: 34 IU/L (ref 0–44)
AST: 38 IU/L (ref 0–40)
Albumin: 4.6 g/dL (ref 3.5–5.5)
Alkaline Phosphatase: 84 IU/L (ref 39–117)
BUN / CREAT RATIO: 13 (ref 9–20)
BUN: 9 mg/dL (ref 6–24)
CALCIUM: 9.6 mg/dL (ref 8.7–10.2)
CHLORIDE: 102 mmol/L (ref 96–106)
CO2: 23 mmol/L (ref 20–29)
Creatinine, Ser: 0.71 mg/dL — ABNORMAL LOW (ref 0.76–1.27)
GFR, EST AFRICAN AMERICAN: 121 mL/min/{1.73_m2} (ref >59)
GFR, EST NON AFRICAN AMERICAN: 105 mL/min/{1.73_m2} (ref >59)
GLOBULIN, TOTAL: 2.6 g/dL (ref 1.5–4.5)
Glucose: 210 mg/dL — ABNORMAL HIGH (ref 65–99)
POTASSIUM: 4.7 mmol/L (ref 3.5–5.2)
SODIUM: 139 mmol/L (ref 134–144)
TOTAL PROTEIN: 7.2 g/dL (ref 6.0–8.5)

## 2017-10-10 LAB — LIPID PANEL
CHOL/HDL RATIO: 3.9 ratio (ref 0.0–5.0)
Cholesterol, Total: 147 mg/dL (ref 100–199)
HDL: 38 mg/dL — ABNORMAL LOW (ref >39)
LDL CALC: 79 mg/dL (ref 0–99)
Triglycerides: 151 mg/dL — ABNORMAL HIGH (ref 0–149)
VLDL Cholesterol Cal: 30 mg/dL (ref 5–40)

## 2017-10-10 LAB — TSH: TSH: 3.06 u[IU]/mL (ref 0.450–4.500)

## 2017-10-10 MED ORDER — "INSULIN SYRINGE-NEEDLE U-100 30G X 5/16"" 1 ML MISC": 0 refills | Status: DC

## 2017-10-10 NOTE — Progress Notes (Signed)
LM 2/1-jhb  

## 2017-10-10 NOTE — Progress Notes (Signed)
Patient is hepatitis C antibody positive, have placed the orders for him to come back in and do the follow-up testing to see if he actually has active hepatitis C or if it just was a past infection.

## 2017-10-14 ENCOUNTER — Telehealth: Payer: Self-pay | Admitting: Family Medicine

## 2017-10-14 DIAGNOSIS — E1140 Type 2 diabetes mellitus with diabetic neuropathy, unspecified: Secondary | ICD-10-CM

## 2017-10-14 DIAGNOSIS — Z794 Long term (current) use of insulin: Principal | ICD-10-CM

## 2017-10-14 DIAGNOSIS — E039 Hypothyroidism, unspecified: Secondary | ICD-10-CM

## 2017-10-14 DIAGNOSIS — E1165 Type 2 diabetes mellitus with hyperglycemia: Secondary | ICD-10-CM

## 2017-10-14 MED ORDER — DULOXETINE HCL 60 MG PO CPEP: 60.0000 mg | ORAL_CAPSULE | Freq: Every day | ORAL | 1 refills | Status: DC

## 2017-10-14 MED ORDER — GABAPENTIN 300 MG PO CAPS: 300.0000 mg | ORAL_CAPSULE | Freq: Two times a day (BID) | ORAL | 1 refills | Status: DC

## 2017-10-14 MED ORDER — EMPAGLIFLOZIN 10 MG PO TABS: 10.0000 mg | ORAL_TABLET | Freq: Every day | ORAL | 1 refills | Status: DC

## 2017-10-14 MED ORDER — LEVOTHYROXINE SODIUM 50 MCG PO TABS: 50.0000 ug | ORAL_TABLET | Freq: Every day | ORAL | 1 refills | Status: DC

## 2017-10-14 MED ORDER — METFORMIN HCL 1000 MG PO TABS: 1000.0000 mg | ORAL_TABLET | Freq: Two times a day (BID) | ORAL | 1 refills | Status: DC

## 2017-10-14 NOTE — Telephone Encounter (Signed)
What is the name of the medication? empagliflozin (JARDIANCE) 10 MG TABS tablet DULoxetine (CYMBALTA) 60 MG capsule gabapentin (NEURONTIN) 300 MG capsule levothyroxine (SYNTHROID, LEVOTHROID) 50 MCG tablet metformin  Have you contacted your pharmacy to request a refill? No he had appt 10/09/17 and still no refill he is upset and cussing at the whole time  Which pharmacy would you like this sent to? [facility] in [place]   Patient notified that their request is being sent to the clinical staff for review and that they should receive a call once it is complete. If they do not receive a call within 24 hours they can check with their pharmacy or our office.

## 2017-10-14 NOTE — Telephone Encounter (Signed)
Pt aware meds sent in

## 2017-10-14 NOTE — Telephone Encounter (Signed)
Go ahead and send in the refills for him for 3 months

## 2017-10-14 NOTE — Telephone Encounter (Signed)
Please review and send in scripts if approved.  Patient was very upset that this was not taken care of during his visit.

## 2017-10-16 NOTE — Progress Notes (Signed)
LMTCB

## 2017-11-04 ENCOUNTER — Telehealth: Payer: Self-pay

## 2017-11-04 ENCOUNTER — Other Ambulatory Visit: Payer: Self-pay | Admitting: *Deleted

## 2017-11-04 DIAGNOSIS — E1140 Type 2 diabetes mellitus with diabetic neuropathy, unspecified: Secondary | ICD-10-CM

## 2017-11-04 DIAGNOSIS — Z794 Long term (current) use of insulin: Principal | ICD-10-CM

## 2017-11-04 NOTE — Progress Notes (Signed)
Multiple attempts made to contact patient about results.  Letter sent on 10/30/17 for patient to call office to discuss lab results.  This encounter will now be closed

## 2017-11-04 NOTE — Telephone Encounter (Signed)
Reactive airway disease on the diagnosis

## 2017-11-04 NOTE — Telephone Encounter (Signed)
Having to do prior auth for Albuterol HFA  Do not see a DX on hx list or problem list  Please advise?

## 2017-11-05 ENCOUNTER — Telehealth: Payer: Self-pay | Admitting: Family Medicine

## 2017-11-05 NOTE — Telephone Encounter (Signed)
Patient aware of lab results, will come in to have other labs drawn.

## 2017-11-06 ENCOUNTER — Other Ambulatory Visit: Payer: Medicare HMO

## 2017-11-06 DIAGNOSIS — R768 Other specified abnormal immunological findings in serum: Secondary | ICD-10-CM

## 2017-11-07 LAB — HCV RNA QUANT
HCV LOG10: 6.859 {Log_IU}/mL
HEPATITIS C QUANTITATION: 7220000 [IU]/mL

## 2017-11-11 ENCOUNTER — Other Ambulatory Visit: Payer: Self-pay | Admitting: *Deleted

## 2017-11-11 DIAGNOSIS — R768 Other specified abnormal immunological findings in serum: Secondary | ICD-10-CM

## 2017-11-17 ENCOUNTER — Ambulatory Visit (INDEPENDENT_AMBULATORY_CARE_PROVIDER_SITE_OTHER): Payer: Medicare HMO | Admitting: Internal Medicine

## 2017-11-17 ENCOUNTER — Encounter: Payer: Self-pay | Admitting: Internal Medicine

## 2017-11-17 DIAGNOSIS — F17210 Nicotine dependence, cigarettes, uncomplicated: Secondary | ICD-10-CM | POA: Diagnosis not present

## 2017-11-17 DIAGNOSIS — B182 Chronic viral hepatitis C: Secondary | ICD-10-CM
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
## 2017-11-17 NOTE — Assessment & Plan Note (Signed)
He has chronic hepatitis C.  We will obtain a full baseline lab workup and ultrasound with elastography.  He will follow-up next month to start therapy.

## 2017-11-18 NOTE — Progress Notes (Deleted)
Psychiatric Initial Adult Assessment   Patient Identification: Jacob Bautista MRN:  [id number] Date of Evaluation:  11/18/2017 Referral Source: Dettinger, Elige Radon, MD Chief Complaint:   Visit Diagnosis: No diagnosis found.  History of Present Illness:   Jacob Bautista is a 57 y.o. year old male with a history of depression, hepatitic C, diabetes, hypertentions, hypothyroidism, who is referred for depression.     Associated Signs/Symptoms: Depression Symptoms:  {DEPRESSION SYMPTOMS:20000} (Hypo) Manic Symptoms:  {BHH MANIC SYMPTOMS:22872} Anxiety Symptoms:  {BHH ANXIETY SYMPTOMS:22873} Psychotic Symptoms:  {BHH PSYCHOTIC SYMPTOMS:22874} PTSD Symptoms: {BHH PTSD SYMPTOMS:22875}  Past Psychiatric History:  Outpatient:  Psychiatry admission:  Previous suicide attempt:  Past trials of medication:  History of violence:   Previous Psychotropic Medications: {YES/NO:21197}  Substance Abuse History in the last 12 months:  {yes no:314532}  Consequences of Substance Abuse: {BHH CONSEQUENCES OF SUBSTANCE ABUSE:22880}  Past Medical History:  Past Medical History:  Diagnosis Date  . BPH (benign prostatic hyperplasia)   . Depression    PTSD  . Diabetes mellitus without complication (HCC)   . Hypertension   . Thyroid disease     Past Surgical History:  Procedure Laterality Date  . FACIAL RECONSTRUCTION SURGERY    . HERNIA REPAIR     x 2  . left hand surg    . SCROTAL SURGERY      Family Psychiatric History: ***  Family History:  Family History  Problem Relation Age of Onset  . Heart disease Father   . Diabetes Father   . Hypertension Father   . Stroke Father   . Stroke Brother     Social History:   Social History   Socioeconomic History  . Marital status: Single    Spouse name: Not on file  . Number of children: Not on file  . Years of education: Not on file  . Highest education level: Not on file  Social Needs  . Financial resource strain: Not on file   . Food insecurity - worry: Not on file  . Food insecurity - inability: Not on file  . Transportation needs - medical: Not on file  . Transportation needs - non-medical: Not on file  Occupational History  . Not on file  Tobacco Use  . Smoking status: Current Every Day Smoker    Types: Cigarettes  . Smokeless tobacco: Current User    Types: Chew  Substance and Sexual Activity  . Alcohol use: Yes    Comment: social  . Drug use: No  . Sexual activity: Not on file  Other Topics Concern  . Not on file  Social History Narrative  . Not on file    Additional Social History: ***  Allergies:  No Known Allergies  Metabolic Disorder Labs: Lab Results  Component Value Date   HGBA1C 9.0 (H) 10/14/2016   MPG 212 10/14/2016   No results found for: PROLACTIN Lab Results  Component Value Date   CHOL 147 10/09/2017   TRIG 151 (H) 10/09/2017   HDL 38 (L) 10/09/2017   CHOLHDL 3.9 10/09/2017   LDLCALC 79 10/09/2017   LDLCALC 68 07/07/2017     Current Medications: Current Outpatient Medications  Medication Sig Dispense Refill  . albuterol (PROVENTIL HFA;VENTOLIN HFA) 108 (90 Base) MCG/ACT inhaler Inhale 1-2 puffs into the lungs every 4 (four) hours as needed for wheezing or shortness of breath. Dispense Ventolin HFA only 1 Inhaler 6  . ALPRAZolam (XANAX) 0.5 MG tablet Take 1 tablet (0.5 mg total) by mouth  daily as needed for anxiety. 30 tablet 2  . aspirin EC 81 MG tablet Take 81 mg by mouth daily.    . Blood Glucose Monitoring Suppl DEVI Test BS BID and PRN. Dx E11.9 1 each 1  . DULoxetine (CYMBALTA) 60 MG capsule Take 1 capsule (60 mg total) by mouth daily. 90 capsule 1  . empagliflozin (JARDIANCE) 10 MG TABS tablet Take 10 mg by mouth daily. 90 tablet 1  . gabapentin (NEURONTIN) 300 MG capsule Take 1 capsule (300 mg total) by mouth 2 (two) times daily. 180 capsule 1  . gemfibrozil (LOPID) 600 MG tablet Take 1 tablet (600 mg total) by mouth 2 (two) times daily before a meal. 180  tablet 1  . glucose blood (ACCU-CHEK AVIVA) test strip Check BS BID and PRN dx E11.9 100 each 12  . insulin glargine (LANTUS) 100 UNIT/ML injection Inject 0.45 mLs (45 Units total) into the skin at bedtime. 10 mL 11  . insulin regular (NOVOLIN R,HUMULIN R) 100 units/mL injection Inject 0.06-0.1 mLs (6-10 Units total) into the skin 2 (two) times daily before a meal. Sliding scale 10 mL 11  . INSULIN SYRINGE 1CC/29G (B-D INSULIN SYRINGE) 29G X 1/2" 1 ML MISC Use with insulin up to QID PRN 300 each 11  . Insulin Syringe-Needle U-100 30G X 5/16" 1 ML MISC Use 1 syringe subcutaneously up to 4 times a day as needed 360 each 0  . Lancets (ACCU-CHEK SOFT TOUCH) lancets Check BS BID and PRN dx E11.9 100 each 12  . levothyroxine (SYNTHROID, LEVOTHROID) 50 MCG tablet Take 1 tablet (50 mcg total) by mouth daily before breakfast. 90 tablet 1  . lisinopril (PRINIVIL,ZESTRIL) 5 MG tablet Take 1 tablet (5 mg total) by mouth daily. 90 tablet 3  . metFORMIN (GLUCOPHAGE) 1000 MG tablet Take 1 tablet (1,000 mg total) by mouth 2 (two) times daily with a meal. 180 tablet 1  . mirtazapine (REMERON) 45 MG tablet Take 1 tablet (45 mg total) by mouth at bedtime. 90 tablet 1  . oxyCODONE-acetaminophen (ROXICET) 5-325 MG tablet Take 1 tablet by mouth every 8 (eight) hours as needed for severe pain. Do not feel until 30 days from prescription date 75 tablet 0  . oxyCODONE-acetaminophen (ROXICET) 5-325 MG tablet Take 1 tablet by mouth every 8 (eight) hours as needed for severe pain. 75 tablet 0  . oxyCODONE-acetaminophen (ROXICET) 5-325 MG tablet Take 1 tablet by mouth every 8 (eight) hours as needed for severe pain. Do not fill until 60 days after prescription date 75 tablet 0   No current facility-administered medications for this visit.     Neurologic: Headache: No Seizure: No Paresthesias:No  Musculoskeletal: Strength & Muscle Tone: within normal limits Gait & Station: normal Patient leans: N/A  Psychiatric  Specialty Exam: ROS  There were no vitals taken for this visit.There is no height or weight on file to calculate BMI.  General Appearance: Fairly Groomed  Eye Contact:  Good  Speech:  Clear and Coherent  Volume:  Normal  Mood:  {BHH MOOD:22306}  Affect:  {Affect (PAA):22687}  Thought Process:  Coherent and Goal Directed  Orientation:  Full (Time, Place, and Person)  Thought Content:  Logical  Suicidal Thoughts:  {ST/HT (PAA):22692}  Homicidal Thoughts:  {ST/HT (PAA):22692}  Memory:  Immediate;   Good Recent;   Good Remote;   Good  Judgement:  {Judgement (PAA):22694}  Insight:  {Insight (PAA):22695}  Psychomotor Activity:  Normal  Concentration:  Concentration: Good and Attention Span: Good  Recall:  [place] Major of Knowledge:Good  Language: Good  Akathisia:  No  Handed:  Right  AIMS (if indicated):  N/A  Assets:  Communication Skills Desire for Improvement  ADL's:  Intact  Cognition: WNL  Sleep:  ***   Assessment  Plan  The patient demonstrates the following risk factors for suicide: Chronic risk factors for suicide include: {Chronic Risk Factors for ZOXWRUE:45409811}. Acute risk factors for suicide include: {Acute Risk Factors for BJYNWGN:56213086}. Protective factors for this patient include: {Protective Factors for Suicide VHQI:69629528}. Considering these factors, the overall suicide risk at this point appears to be {Desc; low/moderate/high:110033}. Patient {ACTION; IS/IS UXL:24401027} appropriate for outpatient follow up.   Treatment Plan Summary: Plan as above   Neysa Hotter, MD 3/12/20193:52 PM

## 2017-11-19 LAB — HEPATITIS B SURFACE ANTIBODY,QUALITATIVE: Hep B S Ab: NONREACTIVE

## 2017-11-19 LAB — EXTRA LAV TOP TUBE

## 2017-11-19 LAB — HEPATITIS A ANTIBODY, TOTAL: HEPATITIS A AB,TOTAL: NONREACTIVE

## 2017-11-19 LAB — PROTIME-INR

## 2017-11-20 LAB — HEPATITIS C GENOTYPE

## 2017-11-21 ENCOUNTER — Ambulatory Visit (HOSPITAL_COMMUNITY): Payer: Medicare Other | Admitting: Psychiatry

## 2017-12-06 ENCOUNTER — Telehealth: Payer: Self-pay | Admitting: Family Medicine

## 2017-12-06 NOTE — Telephone Encounter (Signed)
LMOVM that refill ready at pharmacy after talking with pharmacist

## 2017-12-11 ENCOUNTER — Encounter: Payer: Medicare HMO | Attending: Family Medicine | Admitting: Nutrition

## 2017-12-11 ENCOUNTER — Encounter: Payer: Self-pay | Admitting: Nutrition

## 2017-12-11 VITALS — Ht 72.0 in | Wt 206.0 lb

## 2017-12-11 DIAGNOSIS — E118 Type 2 diabetes mellitus with unspecified complications: Secondary | ICD-10-CM

## 2017-12-11 DIAGNOSIS — E1140 Type 2 diabetes mellitus with diabetic neuropathy, unspecified: Secondary | ICD-10-CM | POA: Insufficient documentation

## 2017-12-11 DIAGNOSIS — Z713 Dietary counseling and surveillance: Secondary | ICD-10-CM | POA: Diagnosis not present

## 2017-12-11 DIAGNOSIS — Z794 Long term (current) use of insulin: Secondary | ICD-10-CM | POA: Insufficient documentation

## 2017-12-11 DIAGNOSIS — E669 Obesity, unspecified: Secondary | ICD-10-CM

## 2017-12-11 DIAGNOSIS — E1165 Type 2 diabetes mellitus with hyperglycemia: Secondary | ICD-10-CM

## 2017-12-11 DIAGNOSIS — IMO0002 Reserved for concepts with insufficient information to code with codable children: Secondary | ICD-10-CM

## 2017-12-11 NOTE — Patient Instructions (Addendum)
Goals 1. Follow Meal Plab 2. Eat times on time 3. Dont skip meals 4.  Keep drinking water 5. Test 4 times a day, record insuln and BS on sheet

## 2017-12-11 NOTE — Progress Notes (Signed)
Diabetes Self-Management Education  Visit Type: First/Initial  Appt. Start Time: 0800 Appt. End Time: 0930  12/11/2017  Mr. Jacob Bautista, identified by name and date of birth, is a 57 y.o. male with a diagnosis of Diabetes: Type 2.  Lives with is daughter and her family. ON disability. Edentulous. ASSESSMENT  Height 6' (1.829 m), weight 206 lb (93.4 kg). Body mass index is 27.94 kg/m.  Diabetes Self-Management Education - 12/11/17 1041      Patient Education   Disease state   Factors that contribute to the development of diabetes;Definition of diabetes, type 1 and 2, and the diagnosis of diabetes;Explored patient's options for treatment of their diabetes    Nutrition management   Role of diet in the treatment of diabetes and the relationship between the three main macronutrients and blood glucose level;Carbohydrate counting;Reviewed blood glucose goals for pre and post meals and how to evaluate the patients' food intake on their blood glucose level.    Physical activity and exercise   Role of exercise on diabetes management, blood pressure control and cardiac health.;Identified with patient nutritional and/or medication changes necessary with exercise.    Medications  Taught/reviewed insulin injection, site rotation, insulin storage and needle disposal.;Reviewed patients medication for diabetes, action, purpose, timing of dose and side effects.;Reviewed medication adjustment guidelines for hyperglycemia and sick days.    Monitoring  Taught/evaluated SMBG meter.;Purpose and frequency of SMBG.    Acute complications  Taught treatment of hypoglycemia - the 15 rule.;Discussed and identified patients' treatment of hyperglycemia.    Chronic complications  Assessed and discussed foot care and prevention of foot problems;Relationship between chronic complications and blood glucose control;Dental care;Retinopathy and reason for yearly dilated eye exams;Identified and discussed with patient  current  chronic complications    Psychosocial adjustment  Worked with patient to identify barriers to care and solutions    Personal strategies to promote health  Lifestyle issues that need to be addressed for better diabetes care;Review risk of smoking and offered smoking cessation      Individualized Goals (developed by patient)   Nutrition  Follow meal plan discussed;Adjust meds/carbs with exercise as discussed    Physical Activity  Exercise 3-5 times per week;30 minutes per day    Medications  take my medication as prescribed    Monitoring   send in my blood glucose log as discussed;test blood glucose pre and post meals as discussed;test my blood glucose as discussed    Reducing Risk  examine blood glucose patterns;do foot checks daily;get labs drawn      Post-Education Assessment   Patient understands the diabetes disease and treatment process.  Needs Review    Patient understands incorporating nutritional management into lifestyle.  Needs Review    Patient undertands incorporating physical activity into lifestyle.  Needs Review    Patient understands using medications safely.  Needs Review    Patient understands monitoring blood glucose, interpreting and using results  Needs Review    Patient understands prevention, detection, and treatment of acute complications.  Needs Review    Patient understands prevention, detection, and treatment of chronic complications.  Needs Review    Patient understands how to develop strategies to address psychosocial issues.  Needs Review    Patient understands how to develop strategies to promote health/change behavior.  Needs Review      Outcomes   Expected Outcomes  Demonstrated interest in learning. Expect positive outcomes    Future DMSE  2 wks    Program Status  Completed  Individualized Plan for Diabetes Self-Management Training:   Learning Objective:  Patient will have a greater understanding of diabetes self-management. Patient education plan  is to attend individual and/or group sessions per assessed needs and concerns.   Plan:   Patient Instructions  Goals 1. Follow Meal Plab 2. Eat times on time 3. Dont skip meals 4.  Keep drinking water 5. Test 4 times a day, record insuln and BS on sheet     Expected Outcomes:  Demonstrated interest in learning. Expect positive outcomes  Education material provided: Living Well with Diabetes, A1C conversion sheet, Meal plan card, My Plate and Carbohydrate counting sheet  If problems or questions, patient to contact team via:  Phone and Email  Future DSME appointment: 2 wks

## 2017-12-12 ENCOUNTER — Telehealth: Payer: Self-pay | Admitting: *Deleted

## 2017-12-12 NOTE — Telephone Encounter (Signed)
Left message cancelling patient's appointment per Dr Orvan Falconerampbell. [facility] Andree CossHowell, Derin Granquist M, RN

## 2017-12-15 ENCOUNTER — Ambulatory Visit: Payer: Medicare HMO | Admitting: Internal Medicine

## 2017-12-16 ENCOUNTER — Telehealth: Payer: Self-pay | Admitting: Nutrition

## 2017-12-16 ENCOUNTER — Ambulatory Visit: Payer: Medicare HMO | Admitting: Nutrition

## 2017-12-16 NOTE — Telephone Encounter (Signed)
VM to call and reschedule missed appt. 

## 2017-12-31 ENCOUNTER — Encounter (HOSPITAL_COMMUNITY): Payer: Self-pay | Admitting: Emergency Medicine

## 2017-12-31 ENCOUNTER — Emergency Department (HOSPITAL_COMMUNITY)
Admission: EM | Admit: 2017-12-31 | Discharge: 2018-01-01 | Disposition: A | Discharge status: Home / Self Care | Payer: Medicare HMO | Attending: Emergency Medicine | Admitting: Emergency Medicine

## 2017-12-31 ENCOUNTER — Other Ambulatory Visit: Payer: Self-pay

## 2017-12-31 DIAGNOSIS — Z794 Long term (current) use of insulin: Secondary | ICD-10-CM | POA: Insufficient documentation

## 2017-12-31 DIAGNOSIS — I10 Essential (primary) hypertension: Secondary | ICD-10-CM | POA: Diagnosis not present

## 2017-12-31 DIAGNOSIS — E119 Type 2 diabetes mellitus without complications: Secondary | ICD-10-CM | POA: Diagnosis not present

## 2017-12-31 DIAGNOSIS — E039 Hypothyroidism, unspecified: Secondary | ICD-10-CM | POA: Diagnosis not present

## 2017-12-31 DIAGNOSIS — F17210 Nicotine dependence, cigarettes, uncomplicated: Secondary | ICD-10-CM | POA: Insufficient documentation

## 2017-12-31 DIAGNOSIS — E860 Dehydration: Secondary | ICD-10-CM | POA: Insufficient documentation

## 2017-12-31 DIAGNOSIS — Z7982 Long term (current) use of aspirin: Secondary | ICD-10-CM | POA: Insufficient documentation

## 2017-12-31 DIAGNOSIS — Z79899 Other long term (current) drug therapy: Secondary | ICD-10-CM | POA: Diagnosis not present

## 2017-12-31 DIAGNOSIS — R404 Transient alteration of awareness: Secondary | ICD-10-CM | POA: Diagnosis not present

## 2017-12-31 DIAGNOSIS — R531 Weakness: Secondary | ICD-10-CM

## 2017-12-31 LAB — BASIC METABOLIC PANEL
Anion gap: 14 (ref 5–15)
BUN: 24 mg/dL — ABNORMAL HIGH (ref 6–20)
CHLORIDE: 102 mmol/L (ref 101–111)
CO2: 22 mmol/L (ref 22–32)
CREATININE: 1.19 mg/dL (ref 0.61–1.24)
Calcium: 9.5 mg/dL (ref 8.9–10.3)
GFR calc Af Amer: >60 mL/min (ref >60)
GFR calc non Af Amer: >60 mL/min (ref >60)
Glucose, Bld: 256 mg/dL — ABNORMAL HIGH (ref 65–99)
Potassium: 4.1 mmol/L (ref 3.5–5.1)
SODIUM: 138 mmol/L (ref 135–145)

## 2017-12-31 LAB — CBC
HCT: 38.5 % — ABNORMAL LOW (ref 39.0–52.0)
Hemoglobin: 13 g/dL (ref 13.0–17.0)
MCH: 29.5 pg (ref 26.0–34.0)
MCHC: 33.8 g/dL (ref 30.0–36.0)
MCV: 87.3 fL (ref 78.0–100.0)
PLATELETS: 185 10*3/uL (ref 150–400)
RBC: 4.41 MIL/uL (ref 4.22–5.81)
RDW: 14.5 % (ref 11.5–15.5)
WBC: 7.1 10*3/uL (ref 4.0–10.5)

## 2017-12-31 LAB — CBG MONITORING, ED: Glucose-Capillary: 298 mg/dL — ABNORMAL HIGH (ref 65–99)

## 2017-12-31 MED ORDER — SODIUM CHLORIDE 0.9 % IV BOLUS
1000.0000 mL | Freq: Once | INTRAVENOUS | Status: AC
  Administered 2017-12-31: 1000 mL via INTRAVENOUS

## 2017-12-31 NOTE — ED Triage Notes (Signed)
Pt in by [facility] for generalized weakness onset today.  Pt denies pain

## 2017-12-31 NOTE — ED Provider Notes (Signed)
[facility] EMERGENCY DEPARTMENT Provider Note   CSN: [id number] Arrival date & time: 12/31/17  2221     History   Chief Complaint Chief Complaint  Patient presents with  . Weakness    HPI Jacob Bautista is a 57 y.o. male.  This patient is a 57 year old male with past medical history of diabetes on insulin, hypertension, depression.  He presents today for evaluation of weakness.  This started abruptly at approximately 8 PM this evening after having dinner with neighbors.  He reports feeling poorly toward the end of the meal, then on the way home developed weakness, near syncope, and diaphoresis.  His daughter thought may be his blood pressure was elevated.  She checked his blood sugar and it was slightly high, but not out of proportion with normal.  She then had him brought in for evaluation.  The diaphoresis has improved.  He denies any chest pain, difficulty breathing, fevers, or other complaints.  He reports just feeling extremely weak and fatigued.  This patient does report that he has been outside all day.  He had mown 2 lawns in the hot sun earlier this afternoon.  The history is provided by the patient.  Weakness  Primary symptoms comment: Generalized weakness. This is a new problem. The current episode started 3 to 5 hours ago. The problem has been gradually improving. There was no focality noted. There has been no fever. Pertinent negatives include no shortness of breath, no chest pain, no vomiting and no headaches.    Past Medical History:  Diagnosis Date  . BPH (benign prostatic hyperplasia)   . Depression    PTSD  . Diabetes mellitus without complication (HCC)   . Hypertension   . Thyroid disease     Patient Active Problem List   Diagnosis Date Noted  . Chronic hepatitis C without hepatic coma (HCC) 11/17/2017  . Cigarette smoker 11/17/2017  . Syncope and collapse 10/14/2016  . Uncontrolled type 2 diabetes mellitus with hyperglycemia, with long-term current use  of insulin (HCC) 10/14/2016  . Type 2 diabetes mellitus with diabetic neuropathy, unspecified (HCC) 09/27/2016  . Hypertension associated with diabetes (HCC) 09/27/2016  . Hypothyroidism 09/27/2016  . Bipolar disorder (HCC) 09/27/2016  . BPH (benign prostatic hyperplasia) 09/27/2016  . Panic disorder 09/27/2016    Past Surgical History:  Procedure Laterality Date  . FACIAL RECONSTRUCTION SURGERY    . HERNIA REPAIR     x 2  . left hand surg    . SCROTAL SURGERY          Home Medications    Prior to Admission medications   Medication Sig Start Date End Date Taking? Authorizing Provider  albuterol (PROVENTIL HFA;VENTOLIN HFA) 108 (90 Base) MCG/ACT inhaler Inhale 1-2 puffs into the lungs every 4 (four) hours as needed for wheezing or shortness of breath. Dispense Ventolin HFA only 04/03/17  Yes Dettinger, Elige Radon, MD  ALPRAZolam Prudy Feeler) 0.5 MG tablet Take 1 tablet (0.5 mg total) by mouth daily as needed for anxiety. 10/09/17  Yes Dettinger, Elige Radon, MD  aspirin EC 81 MG tablet Take 81 mg by mouth daily.   Yes [provider]  DULoxetine (CYMBALTA) 60 MG capsule Take 1 capsule (60 mg total) by mouth daily. 10/14/17  Yes Dettinger, Elige Radon, MD  empagliflozin (JARDIANCE) 10 MG TABS tablet Take 10 mg by mouth daily. 10/14/17  Yes Dettinger, Elige Radon, MD  gabapentin (NEURONTIN) 300 MG capsule Take 1 capsule (300 mg total) by mouth 2 (two)  times daily. 10/14/17  Yes Dettinger, Elige Radon, MD  gemfibrozil (LOPID) 600 MG tablet Take 1 tablet (600 mg total) by mouth 2 (two) times daily before a meal. 04/03/17  Yes Dettinger, Elige Radon, MD  insulin glargine (LANTUS) 100 UNIT/ML injection Inject 0.45 mLs (45 Units total) into the skin at bedtime. Patient taking differently: Inject 50 Units into the skin at bedtime.  07/07/17  Yes Dettinger, Elige Radon, MD  insulin regular (NOVOLIN R,HUMULIN R) 100 units/mL injection Inject 0.06-0.1 mLs (6-10 Units total) into the skin 2 (two) times daily before a  meal. Sliding scale 07/07/17  Yes Dettinger, Elige Radon, MD  levothyroxine (SYNTHROID, LEVOTHROID) 50 MCG tablet Take 1 tablet (50 mcg total) by mouth daily before breakfast. 10/14/17  Yes Dettinger, Elige Radon, MD  lisinopril (PRINIVIL,ZESTRIL) 5 MG tablet Take 1 tablet (5 mg total) by mouth daily. 04/03/17  Yes Dettinger, Elige Radon, MD  metFORMIN (GLUCOPHAGE) 1000 MG tablet Take 1 tablet (1,000 mg total) by mouth 2 (two) times daily with a meal. 10/14/17  Yes Dettinger, Elige Radon, MD  mirtazapine (REMERON) 45 MG tablet Take 1 tablet (45 mg total) by mouth at bedtime. 10/09/17  Yes Dettinger, Elige Radon, MD  oxyCODONE-acetaminophen (ROXICET) 5-325 MG tablet Take 1 tablet by mouth every 8 (eight) hours as needed for severe pain. 10/09/17  Yes Dettinger, Elige Radon, MD  Blood Glucose Monitoring Suppl DEVI Test BS BID and PRN. Dx E11.9 03/03/17   Dettinger, Elige Radon, MD  glucose blood (ACCU-CHEK AVIVA) test strip Check BS BID and PRN dx E11.9 07/24/17   Dettinger, Elige Radon, MD  INSULIN SYRINGE 1CC/29G (B-D INSULIN SYRINGE) 29G X 1/2" 1 ML MISC Use with insulin up to QID PRN 09/27/16   Frederica Kuster, MD  Insulin Syringe-Needle U-100 30G X 5/16" 1 ML MISC Use 1 syringe subcutaneously up to 4 times a day as needed 10/10/17   Dettinger, Elige Radon, MD  Lancets (ACCU-CHEK SOFT TOUCH) lancets Check BS BID and PRN dx E11.9 07/24/17   Dettinger, Elige Radon, MD    Family History Family History  Problem Relation Age of Onset  . Heart disease Father   . Diabetes Father   . Hypertension Father   . Stroke Father   . Stroke Brother     Social History Social History   Tobacco Use  . Smoking status: Current Every Day Smoker    Packs/day: 1.00    Types: Cigarettes  . Smokeless tobacco: Current User    Types: Chew  Substance Use Topics  . Alcohol use: Yes    Comment: social  . Drug use: Yes    Frequency: 3.0 times per week    Types: Marijuana    Comment: today     Allergies   Patient has no known  allergies.   Review of Systems Review of Systems  Respiratory: Negative for shortness of breath.   Cardiovascular: Negative for chest pain.  Gastrointestinal: Negative for vomiting.  Neurological: Positive for weakness. Negative for headaches.  All other systems reviewed and are negative.    Physical Exam Updated Vital Signs BP 106/66   Pulse 79   Temp 97.6 F (36.4 C) (Oral)   Resp 20   Ht 6' (1.829 m)   Wt 94.3 kg (208 lb)   SpO2 93%   BMI 28.21 kg/m   Physical Exam  Constitutional: He is oriented to person, place, and time. He appears well-developed and well-nourished. No distress.  HENT:  Head: Normocephalic and atraumatic.  Mouth/Throat: Oropharynx is clear and moist.  Eyes: Pupils are equal, round, and reactive to light. EOM are normal.  Neck: Normal range of motion. Neck supple.  Cardiovascular: Normal rate and regular rhythm. Exam reveals no friction rub.  No murmur heard. Pulmonary/Chest: Effort normal and breath sounds normal. No respiratory distress. He has no wheezes. He has no rales.  Abdominal: Soft. Bowel sounds are normal. He exhibits no distension. There is no tenderness.  Musculoskeletal: Normal range of motion. He exhibits no edema.  Neurological: He is alert and oriented to person, place, and time. No cranial nerve deficit. He exhibits normal muscle tone. Coordination normal.  Skin: Skin is warm and dry. He is not diaphoretic.  Nursing note and vitals reviewed.    ED Treatments / Results  Labs (all labs ordered are listed, but only abnormal results are displayed) Labs Reviewed  CBG MONITORING, ED - Abnormal; Notable for the following components:      Result Value   Glucose-Capillary 298 (*)    All other components within normal limits  BASIC METABOLIC PANEL  CBC  URINALYSIS, ROUTINE W REFLEX MICROSCOPIC  TROPONIN I    EKG EKG Interpretation  Date/Time:  Wednesday December 31 2017 22:32:21 EDT Ventricular Rate:  77 PR Interval:    QRS  Duration: 121 QT Interval:  420 QTC Calculation: 476 R Axis:   70 Text Interpretation:  Sinus rhythm IVCD, consider atypical RBBB Confirmed by Geoffery LyonseLo, Jahlia Omura ([id number]) on 12/31/2017 11:17:11 PM   Radiology No results found.  Procedures Procedures (including critical care time)  Medications Ordered in ED Medications  sodium chloride 0.9 % bolus 1,000 mL (has no administration in time range)     Initial Impression / Assessment and Plan / ED Course  I have reviewed the triage vital signs and the nursing notes.  Pertinent labs & imaging results that were available during my care of the patient were reviewed by me and considered in my medical decision making (see chart for details).  Patient presents complaining of weakness as described in the HPI.  He had an episode where he felt weak, diaphoretic, and generally unwell.  This occurred after a day of mowing grass in the hot sun.  I suspect symptoms likely related to dehydration/over exertion.  He has had an unchanged EKG and negative troponin x2.  He was given IV fluids here in the ER and is now feeling significantly improved.  At this point, I feel as though he is appropriate for discharge.  Patient and the patient's daughter are both comfortable with this disposition.  He will return if his symptoms worsen or change.  Final Clinical Impressions(s) / ED Diagnoses   Final diagnoses:  None    ED Discharge Orders    None       Geoffery Lyonselo, Moiz Ryant, MD 01/01/18 [phone]

## 2017-12-31 NOTE — ED Notes (Signed)
EKG done and given to Dr Long 

## 2018-01-01 DIAGNOSIS — E860 Dehydration: Secondary | ICD-10-CM | POA: Diagnosis not present

## 2018-01-01 LAB — URINALYSIS, ROUTINE W REFLEX MICROSCOPIC
BACTERIA UA: NONE SEEN
BILIRUBIN URINE: NEGATIVE
Glucose, UA: >=500 mg/dL — AB
Hgb urine dipstick: NEGATIVE
KETONES UR: NEGATIVE mg/dL
LEUKOCYTES UA: NEGATIVE
NITRITE: NEGATIVE
PROTEIN: NEGATIVE mg/dL
SPECIFIC GRAVITY, URINE: 1.022 (ref 1.005–1.030)
pH: 6 (ref 5.0–8.0)

## 2018-01-01 LAB — HEPATIC FUNCTION PANEL
ALT: 27 U/L (ref 17–63)
AST: 49 U/L — ABNORMAL HIGH (ref 15–41)
Albumin: 3.7 g/dL (ref 3.5–5.0)
Alkaline Phosphatase: 68 U/L (ref 38–126)
BILIRUBIN INDIRECT: 0.6 mg/dL (ref 0.3–0.9)
Bilirubin, Direct: 0.1 mg/dL (ref 0.1–0.5)
TOTAL PROTEIN: 6.8 g/dL (ref 6.5–8.1)
Total Bilirubin: 0.7 mg/dL (ref 0.3–1.2)

## 2018-01-01 LAB — TROPONIN I

## 2018-01-01 LAB — I-STAT TROPONIN, ED: Troponin i, poc: 0 ng/mL (ref 0.00–0.08)

## 2018-01-01 NOTE — Discharge Instructions (Addendum)
Drink plenty of fluids and get plenty of rest.  Return to the emergency department if you develop chest pain, difficulty breathing, or a significant worsening of your symptoms.

## 2018-01-12 ENCOUNTER — Ambulatory Visit (INDEPENDENT_AMBULATORY_CARE_PROVIDER_SITE_OTHER): Payer: Medicare HMO | Admitting: Family Medicine

## 2018-01-12 ENCOUNTER — Encounter: Payer: Self-pay | Admitting: Family Medicine

## 2018-01-12 VITALS — BP 132/81 | HR 70 | Temp 97.0°F | Ht 72.0 in | Wt 201.0 lb

## 2018-01-12 DIAGNOSIS — F3177 Bipolar disorder, in partial remission, most recent episode mixed: Secondary | ICD-10-CM | POA: Diagnosis not present

## 2018-01-12 DIAGNOSIS — E1165 Type 2 diabetes mellitus with hyperglycemia: Secondary | ICD-10-CM

## 2018-01-12 DIAGNOSIS — E1159 Type 2 diabetes mellitus with other circulatory complications: Secondary | ICD-10-CM | POA: Diagnosis not present

## 2018-01-12 DIAGNOSIS — E039 Hypothyroidism, unspecified: Secondary | ICD-10-CM

## 2018-01-12 DIAGNOSIS — Z794 Long term (current) use of insulin: Secondary | ICD-10-CM

## 2018-01-12 DIAGNOSIS — E1140 Type 2 diabetes mellitus with diabetic neuropathy, unspecified: Secondary | ICD-10-CM

## 2018-01-12 DIAGNOSIS — F410 Panic disorder [episodic paroxysmal anxiety] without agoraphobia: Secondary | ICD-10-CM

## 2018-01-12 DIAGNOSIS — I10 Essential (primary) hypertension: Secondary | ICD-10-CM

## 2018-01-12 LAB — BAYER DCA HB A1C WAIVED: HB A1C (BAYER DCA - WAIVED): 7.3 % — ABNORMAL HIGH (ref <7.0)

## 2018-01-12 MED ORDER — OXYCODONE-ACETAMINOPHEN 5-325 MG PO TABS: 1.0000 | ORAL_TABLET | Freq: Three times a day (TID) | ORAL | 0 refills | Status: DC | PRN

## 2018-01-12 MED ORDER — ALPRAZOLAM 0.5 MG PO TABS: 0.5000 mg | ORAL_TABLET | Freq: Every day | ORAL | 2 refills | Status: DC | PRN

## 2018-01-12 NOTE — Progress Notes (Signed)
BP 132/81   Pulse 70   Temp (!) 97 F (36.1 C) (Oral)   Ht 6' (1.829 m)   Wt 201 lb (91.2 kg)   BMI 27.26 kg/m    Subjective:    Patient ID: Jacob Bautista, male    DOB: Jan 17, 1961, 57 y.o.   MRN: [id number]  HPI: Jacob Bautista is a 57 y.o. male presenting on 01/12/2018 for Diabetes; Hypothyroidism; and Hypertension (has had hypotension episodes recently)   HPI Hypothyroidism recheck Patient is coming in for thyroid recheck today as well. They deny any issues with hair changes or heat or cold problems or diarrhea or constipation. They deny any chest pain or palpitations. They are currently on levothyroxine 14mcrograms   Type 2 diabetes mellitus Patient comes in today for recheck of his diabetes. Patient has been currently taking Jardiance and Lantus 50 units at bedtime and Novolin R 6 to 10 units 2 times daily before meals, he does admit that he sometimes forgets his are especially when he is out working.. Patient is currently on an ACE inhibitor/ARB. Patient has not seen an ophthalmologist this year. Patient denies any issues with their feet.  Patient has known neuropathy and has very little sensation but says the pain is mostly controlled.  Hypertension Patient is currently on lisinopril 5, more for diabetes protection then blood pressure, his blood pressure has been down recently and he had one episode where it was 70s over 40s and he went to the emergency department, and their blood pressure today is 132/81. Patient denies any lightheadedness or dizziness. Patient denies headaches, blurred vision, chest pains, shortness of breath, or weakness. Denies any side effects from medication and is content with current medication.   Bipolar disorder and panic disorder Patient takes alprazolam and Remeron and Cymbalta for his mood disorder and feels like they are doing very well.  He denies any manic episodes.  He denies any suicidal ideations or thoughts of hurting himself.  He says he is  doing well on his medications currently. [facility] 01/12/2018 12/11/2017 11/17/2017 10/09/2017 07/07/2017  Decreased Interest 2 0 0 2 3  Down, Depressed, Hopeless 2 - _0 PHQ - 2 Score 4 0 _1 Altered sleeping 2 - - 3 3  Tired, decreased energy 2 - - 3 3  Change in appetite 1 - - 2 3  Feeling bad or failure about yourself  2 - - 3 3  Trouble concentrating 1 - - 3 3  Moving slowly or fidgety/restless 1 - - 2 2  Suicidal thoughts 0 - - 0 0  PHQ-9 Score 13 - - 20 23  Difficult doing work/chores - - - - Somewhat difficult  Some recent data might be hidden    Patient has chronic pain and currently takes Roxicet for and denies any major issues and says it is working currently.  Relevant past medical, surgical, family and social history reviewed and updated as indicated. Interim medical history since our last visit reviewed. Allergies and medications reviewed and updated.  Review of Systems  Constitutional: Negative for chills and fever.  Respiratory: Negative for shortness of breath and wheezing.   Cardiovascular: Negative for chest pain and leg swelling.  Musculoskeletal: Positive for arthralgias and back pain. Negative for gait problem.  Skin: Negative for rash.  Neurological: Negative for dizziness, weakness, light-headedness and headaches.  All other systems reviewed and are negative.   Per HPI unless specifically indicated above  Allergies as of 01/12/2018   No Known Allergies     Medication List        Accurate as of 01/12/18 10:14 AM. Always use your most recent med list.          accu-chek soft touch lancets 360 each by Other route 4 (four) times daily. Use as instructed   albuterol 108 (90 Base) MCG/ACT inhaler Commonly known as:  PROVENTIL HFA;VENTOLIN HFA Inhale 1-2 puffs into the lungs every 4 (four) hours as needed for wheezing or shortness of breath. Dispense Ventolin HFA only   ALPRAZolam 0.5 MG tablet Commonly known as:  XANAX Take 1  tablet (0.5 mg total) by mouth daily as needed for anxiety.   aspirin EC 81 MG tablet Take 81 mg by mouth daily.   DULoxetine 60 MG capsule Commonly known as:  CYMBALTA Take 1 capsule (60 mg total) by mouth daily.   empagliflozin 10 MG Tabs tablet Commonly known as:  JARDIANCE Take 10 mg by mouth daily.   gabapentin 300 MG capsule Commonly known as:  NEURONTIN Take 1 capsule (300 mg total) by mouth 2 (two) times daily.   gemfibrozil 600 MG tablet Commonly known as:  LOPID Take 1 tablet (600 mg total) by mouth 2 (two) times daily before a meal.   insulin glargine 100 UNIT/ML injection Commonly known as:  LANTUS Inject 50 Units into the skin at bedtime.   insulin regular 100 units/mL injection Commonly known as:  NOVOLIN R,HUMULIN R Inject 0.06-0.1 mLs (6-10 Units total) into the skin 2 (two) times daily before a meal. Sliding scale   Insulin Syringe-Needle U-100 31G X 5/16" 1 ML Misc 100 each by Does not apply route.   levothyroxine 50 MCG tablet Commonly known as:  SYNTHROID, LEVOTHROID Take 1 tablet (50 mcg total) by mouth daily before breakfast.   lisinopril 5 MG tablet Commonly known as:  PRINIVIL,ZESTRIL Take 1 tablet (5 mg total) by mouth daily.   metFORMIN 1000 MG tablet Commonly known as:  GLUCOPHAGE Take 1 tablet (1,000 mg total) by mouth 2 (two) times daily with a meal.   mirtazapine 45 MG tablet Commonly known as:  REMERON Take 1 tablet (45 mg total) by mouth at bedtime.   oxyCODONE-acetaminophen 5-325 MG tablet Commonly known as:  ROXICET Take 1 tablet by mouth every 8 (eight) hours as needed for severe pain.          Objective:    BP 132/81   Pulse 70   Temp (!) 97 F (36.1 C) (Oral)   Ht 6' (1.829 m)   Wt 201 lb (91.2 kg)   BMI 27.26 kg/m   Wt Readings from Last 3 Encounters:  01/12/18 201 lb (91.2 kg)  12/31/17 208 lb (94.3 kg)  12/11/17 206 lb (93.4 kg)    Physical Exam  Constitutional: He is oriented to person, place, and time.  He appears well-developed and well-nourished. No distress.  Eyes: Conjunctivae are normal. No scleral icterus.  Neck: Neck supple. No thyromegaly present.  Cardiovascular: Normal rate, regular rhythm, normal heart sounds and intact distal pulses.  No murmur heard. Pulmonary/Chest: Effort normal and breath sounds normal. No respiratory distress. He has no wheezes.  Musculoskeletal: Normal range of motion. He exhibits no edema.  Lymphadenopathy:    He has no cervical adenopathy.  Neurological: He is alert and oriented to person, place, and time. Coordination normal.  Skin: Skin is warm and dry. No rash noted. He is not diaphoretic.  Psychiatric: He has  a normal mood and affect. His behavior is normal.  Nursing note and vitals reviewed.  Diabetic Foot Exam - Simple   Simple Foot Form Diabetic Foot exam was performed with the following findings:  Yes 01/12/2018 10:25 AM  Visual Inspection No deformities, no ulcerations, no other skin breakdown bilaterally:  Yes Sensation Testing See comments:  Yes Pulse Check Posterior Tibialis and Dorsalis pulse intact bilaterally:  Yes Comments Patient has very little to no sensation anywhere in his foot to fine touch, able to feel pressure, says he checks his feet regularly.        Assessment & Plan:   Problem List Items Addressed This Visit      Cardiovascular and Mediastinum   Hypertension associated with diabetes ([place])   Relevant Medications   insulin glargine (LANTUS) 100 UNIT/ML injection   Other Relevant Orders   CMP14+EGFR   Lipid panel   Bayer DCA Hb A1c Waived     Endocrine   Type 2 diabetes mellitus with diabetic neuropathy, unspecified (HCC)   Relevant Medications   insulin glargine (LANTUS) 100 UNIT/ML injection   oxyCODONE-acetaminophen (ROXICET) 5-325 MG tablet   Other Relevant Orders   Microalbumin / creatinine urine ratio   CMP14+EGFR   Lipid panel   Bayer DCA Hb A1c Waived   Hypothyroidism   Relevant Orders   CBC  with Differential/Platelet   TSH   Uncontrolled type 2 diabetes mellitus with hyperglycemia, with long-term current use of insulin (HCC) - Primary   Relevant Medications   insulin glargine (LANTUS) 100 UNIT/ML injection   Other Relevant Orders   Microalbumin / creatinine urine ratio   CBC with Differential/Platelet   CMP14+EGFR   Lipid panel   Bayer DCA Hb A1c Waived     Other   Bipolar disorder (HCC)   Relevant Medications   ALPRAZolam (XANAX) 0.5 MG tablet   Panic disorder   Relevant Medications   ALPRAZolam (XANAX) 0.5 MG tablet      Patient has been having some low blood pressures, recommended for him to stop lisinopril 5 mg for 1 week, if it does not make a difference then we may need to back off on Jardiance because of its diuretic effect.  Continue current pain medication and Xanax, continue to try and wean down off Xanax. Follow up plan: Return in about 3 months (around 04/14/2018), or if symptoms worsen or fail to improve, for Recheck diabetes and blood pressure and thyroid.  Counseling provided for all of the vaccine components Orders Placed This Encounter  Procedures  . Microalbumin / creatinine urine ratio  . CBC with Differential/Platelet  . CMP14+EGFR  . Lipid panel  . Bayer DCA Hb A1c Waived  . TSH    Caryl Pina, MD [facility] 01/12/2018, 10:14 AM

## 2018-01-12 NOTE — Patient Instructions (Signed)
Patient has been having some low blood pressures, recommended for him to stop lisinopril 5 mg for 1 week, if it does not make a difference then we may need to back off on Jardiance because of its diuretic effect.

## 2018-01-13 LAB — CBC WITH DIFFERENTIAL/PLATELET
Basophils Absolute: 0 10*3/uL (ref 0.0–0.2)
Basos: 0 %
EOS (ABSOLUTE): 0.3 10*3/uL (ref 0.0–0.4)
EOS: 4 %
HEMATOCRIT: 40.6 % (ref 37.5–51.0)
Hemoglobin: 13.4 g/dL (ref 13.0–17.7)
IMMATURE GRANULOCYTES: 0 %
Immature Grans (Abs): 0 10*3/uL (ref 0.0–0.1)
Lymphocytes Absolute: 2.5 10*3/uL (ref 0.7–3.1)
Lymphs: 34 %
MCH: 28.9 pg (ref 26.6–33.0)
MCHC: 33 g/dL (ref 31.5–35.7)
MCV: 88 fL (ref 79–97)
MONOS ABS: 0.5 10*3/uL (ref 0.1–0.9)
Monocytes: 6 %
NEUTROS PCT: 56 %
Neutrophils Absolute: 4 10*3/uL (ref 1.4–7.0)
Platelets: 251 10*3/uL (ref 150–379)
RBC: 4.64 x10E6/uL (ref 4.14–5.80)
RDW: 14.8 % (ref 12.3–15.4)
WBC: 7.3 10*3/uL (ref 3.4–10.8)

## 2018-01-13 LAB — LIPID PANEL
CHOL/HDL RATIO: 3.4 ratio (ref 0.0–5.0)
Cholesterol, Total: 116 mg/dL (ref 100–199)
HDL: 34 mg/dL — AB (ref >39)
LDL CALC: 57 mg/dL (ref 0–99)
TRIGLYCERIDES: 124 mg/dL (ref 0–149)
VLDL Cholesterol Cal: 25 mg/dL (ref 5–40)

## 2018-01-13 LAB — CMP14+EGFR
ALT: 27 IU/L (ref 0–44)
AST: 44 IU/L — AB (ref 0–40)
Albumin/Globulin Ratio: 1.7 (ref 1.2–2.2)
Albumin: 4.5 g/dL (ref 3.5–5.5)
Alkaline Phosphatase: 88 IU/L (ref 39–117)
BUN/Creatinine Ratio: 19 (ref 9–20)
BUN: 16 mg/dL (ref 6–24)
Bilirubin Total: <0.2 mg/dL (ref 0.0–1.2)
CALCIUM: 9.9 mg/dL (ref 8.7–10.2)
CO2: 20 mmol/L (ref 20–29)
Chloride: 100 mmol/L (ref 96–106)
Creatinine, Ser: 0.84 mg/dL (ref 0.76–1.27)
GFR calc Af Amer: 112 mL/min/{1.73_m2} (ref >59)
GFR, EST NON AFRICAN AMERICAN: 97 mL/min/{1.73_m2} (ref >59)
Globulin, Total: 2.6 g/dL (ref 1.5–4.5)
Glucose: 138 mg/dL — ABNORMAL HIGH (ref 65–99)
Potassium: 4.9 mmol/L (ref 3.5–5.2)
Sodium: 137 mmol/L (ref 134–144)
Total Protein: 7.1 g/dL (ref 6.0–8.5)

## 2018-01-13 LAB — MICROALBUMIN / CREATININE URINE RATIO
Creatinine, Urine: 21.5 mg/dL
Microalbumin, Urine: <3 ug/mL

## 2018-01-13 LAB — TSH: TSH: 1.79 u[IU]/mL (ref 0.450–4.500)

## 2018-01-28 ENCOUNTER — Other Ambulatory Visit: Payer: Self-pay | Admitting: *Deleted

## 2018-01-28 ENCOUNTER — Telehealth: Payer: Self-pay | Admitting: Family Medicine

## 2018-01-28 MED ORDER — GEMFIBROZIL 600 MG PO TABS: 600.0000 mg | ORAL_TABLET | Freq: Two times a day (BID) | ORAL | 1 refills | Status: DC

## 2018-01-28 NOTE — Telephone Encounter (Signed)
What is the name of the medication? gemfibrozil (LOPID) 600 MG tablet  Have you contacted your pharmacy to request a refill? No more refills  Which pharmacy would you like this sent to? [facility] [address]   Patient notified that their request is being sent to the clinical staff for review and that they should receive a call once it is complete. If they do not receive a call within 24 hours they can check with their pharmacy or our office.

## 2018-01-28 NOTE — Progress Notes (Signed)
RX sent in per pt request Okayed per Dr Louanne Skye

## 2018-02-19 ENCOUNTER — Other Ambulatory Visit: Payer: Self-pay | Admitting: Family Medicine

## 2018-02-20 ENCOUNTER — Other Ambulatory Visit: Payer: Self-pay | Admitting: Family Medicine

## 2018-04-01 ENCOUNTER — Other Ambulatory Visit: Payer: Self-pay | Admitting: Family Medicine

## 2018-04-09 ENCOUNTER — Ambulatory Visit: Payer: Medicare HMO | Admitting: Family Medicine

## 2018-04-21 ENCOUNTER — Other Ambulatory Visit: Payer: Self-pay | Admitting: Family Medicine

## 2018-04-21 NOTE — Telephone Encounter (Signed)
Per Dettigner's last not patient is taking 50 units daily

## 2018-04-24 ENCOUNTER — Encounter: Payer: Self-pay | Admitting: Family Medicine

## 2018-04-24 ENCOUNTER — Ambulatory Visit (INDEPENDENT_AMBULATORY_CARE_PROVIDER_SITE_OTHER): Payer: Medicare HMO | Admitting: Family Medicine

## 2018-04-24 VITALS — BP 136/84 | HR 105 | Temp 97.4°F | Ht 72.0 in | Wt 190.8 lb

## 2018-04-24 DIAGNOSIS — Z794 Long term (current) use of insulin: Secondary | ICD-10-CM

## 2018-04-24 DIAGNOSIS — E1159 Type 2 diabetes mellitus with other circulatory complications: Secondary | ICD-10-CM

## 2018-04-24 DIAGNOSIS — E039 Hypothyroidism, unspecified: Secondary | ICD-10-CM

## 2018-04-24 DIAGNOSIS — E1165 Type 2 diabetes mellitus with hyperglycemia: Secondary | ICD-10-CM

## 2018-04-24 DIAGNOSIS — I10 Essential (primary) hypertension: Secondary | ICD-10-CM

## 2018-04-24 DIAGNOSIS — E1140 Type 2 diabetes mellitus with diabetic neuropathy, unspecified: Secondary | ICD-10-CM | POA: Diagnosis not present

## 2018-04-24 DIAGNOSIS — F3177 Bipolar disorder, in partial remission, most recent episode mixed: Secondary | ICD-10-CM

## 2018-04-24 LAB — BAYER DCA HB A1C WAIVED: HB A1C: 7.4 % — AB (ref <7.0)

## 2018-04-24 MED ORDER — OXYCODONE-ACETAMINOPHEN 5-325 MG PO TABS: 1.0000 | ORAL_TABLET | Freq: Three times a day (TID) | ORAL | 0 refills | Status: DC | PRN

## 2018-04-24 MED ORDER — DULOXETINE HCL 60 MG PO CPEP: 120.0000 mg | ORAL_CAPSULE | Freq: Every day | ORAL | 1 refills | Status: DC

## 2018-04-24 MED ORDER — ALPRAZOLAM 0.5 MG PO TABS: 0.5000 mg | ORAL_TABLET | Freq: Every day | ORAL | 2 refills | Status: DC | PRN

## 2018-04-24 NOTE — Progress Notes (Signed)
BP 136/84   Pulse (!) 105   Temp (!) 97.4 F (36.3 C) (Oral)   Ht 6' (1.829 m)   Wt 190 lb 12.8 oz (86.5 kg)   BMI 25.88 kg/m    Subjective:    Patient ID: Jacob Bautista, male    DOB: 1961/07/28, 57 y.o.   MRN: [id number]  HPI: Jacob Bautista is a 57 y.o. male presenting on 04/24/2018 for Medication Refill (xanax and oxy)   HPI Type 2 diabetes mellitus Patient comes in today for recheck of his diabetes. Patient has been currently taking Jardiance and Lantus 50 units and Novolin 6-10 twice daily. Patient is currently on an ACE inhibitor/ARB. Patient has not seen an ophthalmologist this year. Patient denies any new issues with their feet.  Patient has chronic back pain and some pain shooting down both of his legs from that.  He takes gabapentin as well for this.  Hypothyroidism recheck Patient is coming in for thyroid recheck today as well. They deny any issues with hair changes or heat or cold problems or diarrhea or constipation. They deny any chest pain or palpitations. They are currently on levothyroxine 50 micrograms   Hypertension Patient is currently on lisinopril, and their blood pressure today is 136/84. Patient denies any lightheadedness or dizziness. Patient denies headaches, blurred vision, chest pains, shortness of breath, or weakness. Denies any side effects from medication and is content with current medication.   Bipolar and anxiety Patient is coming in for recheck for bipolar and anxiety, he is currently on Cymbalta and Neurontin and the occasional alprazolam and Remeron.  Patient says the medication doing well form denies any major mood swings or issues and denies any suicidal ideations.  Depression screen [facility] 2/9 04/24/2018 01/12/2018 12/11/2017 11/17/2017 10/09/2017  Decreased Interest 2 2 0 0 2  Down, Depressed, Hopeless 2 2 - 1 2  PHQ - 2 Score 4 4 0 1 4  Altered sleeping 3 2 - - 3  Tired, decreased energy 3 2 - - 3  Change in appetite 3 1 - - 2  Feeling bad or  failure about yourself  2 2 - - 3  Trouble concentrating 3 1 - - 3  Moving slowly or fidgety/restless 1 1 - - 2  Suicidal thoughts 0 0 - - 0  PHQ-9 Score 19 13 - - 20  Difficult doing work/chores - - - - -  Some recent data might be hidden     Relevant past medical, surgical, family and social history reviewed and updated as indicated. Interim medical history since our last visit reviewed. Allergies and medications reviewed and updated.  Review of Systems  Constitutional: Negative for chills and fever.  Eyes: Negative for visual disturbance.  Respiratory: Negative for shortness of breath and wheezing.   Cardiovascular: Negative for chest pain and leg swelling.  Musculoskeletal: Positive for arthralgias and back pain. Negative for gait problem.  Skin: Negative for rash.  Neurological: Negative for dizziness, weakness, light-headedness, numbness and headaches.  Psychiatric/Behavioral: Positive for decreased concentration and dysphoric mood. Negative for self-injury, sleep disturbance and suicidal ideas. The patient is nervous/anxious.   All other systems reviewed and are negative.   Per HPI unless specifically indicated above   Allergies as of 04/24/2018   No Known Allergies     Medication List        Accurate as of 04/24/18  1:15 PM. Always use your most recent med list.  accu-chek soft touch lancets 360 each by Other route 4 (four) times daily. Use as instructed   albuterol 108 (90 Base) MCG/ACT inhaler Commonly known as:  PROVENTIL HFA;VENTOLIN HFA INHALE 1 TO 2 PUFFS INTO LUNGS EVERY 4 HOURS AS NEEDED FOR WHEEZING OR SHORTNESS OF BREATH   ALPRAZolam 0.5 MG tablet Commonly known as:  XANAX Take 1 tablet (0.5 mg total) by mouth daily as needed for anxiety.   aspirin EC 81 MG tablet Take 81 mg by mouth daily.   DULoxetine 60 MG capsule Commonly known as:  CYMBALTA Take 1 capsule (60 mg total) by mouth daily.   empagliflozin 10 MG Tabs tablet Commonly  known as:  JARDIANCE Take 10 mg by mouth daily.   gabapentin 300 MG capsule Commonly known as:  NEURONTIN Take 1 capsule (300 mg total) by mouth 2 (two) times daily.   gemfibrozil 600 MG tablet Commonly known as:  LOPID Take 1 tablet (600 mg total) by mouth 2 (two) times daily before a meal.   insulin glargine 100 UNIT/ML injection Commonly known as:  LANTUS Inject 0.5 mLs (50 Units total) into the skin at bedtime.   insulin regular 100 units/mL injection Commonly known as:  NOVOLIN R,HUMULIN R Inject 0.06-0.1 mLs (6-10 Units total) into the skin 2 (two) times daily before a meal. Sliding scale   Insulin Syringe-Needle U-100 31G X 5/16" 1 ML Misc 100 each by Does not apply route.   levothyroxine 50 MCG tablet Commonly known as:  SYNTHROID, LEVOTHROID Take 1 tablet (50 mcg total) by mouth daily before breakfast.   lisinopril 5 MG tablet Commonly known as:  PRINIVIL,ZESTRIL Take 1 tablet (5 mg total) by mouth daily.   metFORMIN 1000 MG tablet Commonly known as:  GLUCOPHAGE Take 1 tablet (1,000 mg total) by mouth 2 (two) times daily with a meal.   mirtazapine 45 MG tablet Commonly known as:  REMERON Take 1 tablet (45 mg total) by mouth at bedtime.   oxyCODONE-acetaminophen 5-325 MG tablet Commonly known as:  PERCOCET/ROXICET Take 1 tablet by mouth every 8 (eight) hours as needed for severe pain.   oxyCODONE-acetaminophen 5-325 MG tablet Commonly known as:  PERCOCET/ROXICET Take 1 tablet by mouth every 8 (eight) hours as needed for moderate pain or severe pain. Do not refill until 30 days from prescription date   oxyCODONE-acetaminophen 5-325 MG tablet Commonly known as:  PERCOCET/ROXICET Take 1 tablet by mouth every 8 (eight) hours as needed for severe pain. Do not refill until 60 days from prescription date          Objective:    BP 136/84   Pulse (!) 105   Temp (!) 97.4 F (36.3 C) (Oral)   Ht 6' (1.829 m)   Wt 190 lb 12.8 oz (86.5 kg)   BMI 25.88 kg/m     Wt Readings from Last 3 Encounters:  04/24/18 190 lb 12.8 oz (86.5 kg)  01/12/18 201 lb (91.2 kg)  12/31/17 208 lb (94.3 kg)    Physical Exam  Constitutional: He is oriented to person, place, and time. He appears well-developed and well-nourished. No distress.  Eyes: Pupils are equal, round, and reactive to light. Conjunctivae and EOM are normal. Right eye exhibits no discharge. No scleral icterus.  Neck: Neck supple. No thyromegaly present.  Cardiovascular: Normal rate, regular rhythm, normal heart sounds and intact distal pulses.  No murmur heard. Pulmonary/Chest: Effort normal and breath sounds normal. No respiratory distress. He has no wheezes.  Musculoskeletal: Normal range of  motion. He exhibits no edema.       Lumbar back: He exhibits tenderness (Low back pain in a bandlike area, chronic).  Lymphadenopathy:    He has no cervical adenopathy.  Neurological: He is alert and oriented to person, place, and time. Coordination normal.  Skin: Skin is warm and dry. No rash noted. He is not diaphoretic.  Psychiatric: His behavior is normal. His mood appears anxious. He exhibits a depressed mood. He expresses no suicidal ideation. He expresses no suicidal plans.  Nursing note and vitals reviewed.       Assessment & Plan:   Problem List Items Addressed This Visit      Cardiovascular and Mediastinum   Hypertension associated with diabetes (HCC)     Endocrine   Type 2 diabetes mellitus with diabetic neuropathy, unspecified (HCC)   Relevant Medications   oxyCODONE-acetaminophen (ROXICET) 5-325 MG tablet   DULoxetine (CYMBALTA) 60 MG capsule   Other Relevant Orders   Bayer DCA Hb A1c Waived (Completed)   Hypothyroidism   Uncontrolled type 2 diabetes mellitus with hyperglycemia, with long-term current use of insulin (HCC) - Primary   Relevant Orders   Bayer DCA Hb A1c Waived (Completed)     Other   Bipolar disorder (HCC)   Relevant Medications   ALPRAZolam (XANAX) 0.5 MG tablet        Follow up plan: Return in about 3 months (around 07/25/2018), or if symptoms worsen or fail to improve, for Diabetes and hypertension and anxiety recheck.  Counseling provided for all of the vaccine components No orders of the defined types were placed in this encounter.   Arville CareJoshua Ronald Londo, MD [facility] 04/24/2018, 1:15 PM

## 2018-04-27 ENCOUNTER — Telehealth: Payer: Self-pay | Admitting: Family Medicine

## 2018-04-30 DIAGNOSIS — H40053 Ocular hypertension, bilateral: Secondary | ICD-10-CM | POA: Diagnosis not present

## 2018-04-30 DIAGNOSIS — E113293 Type 2 diabetes mellitus with mild nonproliferative diabetic retinopathy without macular edema, bilateral: Secondary | ICD-10-CM | POA: Diagnosis not present

## 2018-04-30 DIAGNOSIS — E119 Type 2 diabetes mellitus without complications: Secondary | ICD-10-CM | POA: Diagnosis not present

## 2018-05-06 ENCOUNTER — Other Ambulatory Visit: Payer: Self-pay | Admitting: Family Medicine

## 2018-05-06 DIAGNOSIS — E1165 Type 2 diabetes mellitus with hyperglycemia: Secondary | ICD-10-CM

## 2018-05-06 DIAGNOSIS — E1140 Type 2 diabetes mellitus with diabetic neuropathy, unspecified: Secondary | ICD-10-CM

## 2018-05-06 DIAGNOSIS — I10 Essential (primary) hypertension: Secondary | ICD-10-CM

## 2018-05-06 DIAGNOSIS — E039 Hypothyroidism, unspecified: Secondary | ICD-10-CM

## 2018-05-06 DIAGNOSIS — F410 Panic disorder [episodic paroxysmal anxiety] without agoraphobia: Secondary | ICD-10-CM

## 2018-05-06 DIAGNOSIS — Z794 Long term (current) use of insulin: Secondary | ICD-10-CM

## 2018-05-22 ENCOUNTER — Other Ambulatory Visit: Payer: Self-pay | Admitting: Family Medicine

## 2018-05-22 ENCOUNTER — Other Ambulatory Visit: Payer: Self-pay | Admitting: Family

## 2018-05-22 DIAGNOSIS — H25812 Combined forms of age-related cataract, left eye: Secondary | ICD-10-CM | POA: Diagnosis not present

## 2018-05-22 DIAGNOSIS — Z794 Long term (current) use of insulin: Principal | ICD-10-CM

## 2018-05-22 DIAGNOSIS — E1140 Type 2 diabetes mellitus with diabetic neuropathy, unspecified: Secondary | ICD-10-CM

## 2018-05-22 DIAGNOSIS — H25811 Combined forms of age-related cataract, right eye: Secondary | ICD-10-CM | POA: Diagnosis not present

## 2018-05-22 DIAGNOSIS — E119 Type 2 diabetes mellitus without complications: Secondary | ICD-10-CM | POA: Diagnosis not present

## 2018-05-22 DIAGNOSIS — H25813 Combined forms of age-related cataract, bilateral: Secondary | ICD-10-CM | POA: Diagnosis not present

## 2018-06-22 ENCOUNTER — Other Ambulatory Visit: Payer: Self-pay | Admitting: Family Medicine

## 2018-07-13 DIAGNOSIS — R61 Generalized hyperhidrosis: Secondary | ICD-10-CM | POA: Diagnosis not present

## 2018-07-13 DIAGNOSIS — E162 Hypoglycemia, unspecified: Secondary | ICD-10-CM | POA: Diagnosis not present

## 2018-07-18 ENCOUNTER — Other Ambulatory Visit: Payer: Self-pay | Admitting: Family Medicine

## 2018-07-18 DIAGNOSIS — E1140 Type 2 diabetes mellitus with diabetic neuropathy, unspecified: Secondary | ICD-10-CM

## 2018-07-18 DIAGNOSIS — F410 Panic disorder [episodic paroxysmal anxiety] without agoraphobia: Secondary | ICD-10-CM

## 2018-07-18 DIAGNOSIS — I10 Essential (primary) hypertension: Secondary | ICD-10-CM

## 2018-07-18 DIAGNOSIS — E039 Hypothyroidism, unspecified: Secondary | ICD-10-CM

## 2018-07-18 DIAGNOSIS — Z794 Long term (current) use of insulin: Secondary | ICD-10-CM

## 2018-07-18 DIAGNOSIS — E1165 Type 2 diabetes mellitus with hyperglycemia: Secondary | ICD-10-CM

## 2018-07-22 ENCOUNTER — Other Ambulatory Visit: Payer: Self-pay | Admitting: Internal Medicine

## 2018-07-22 DIAGNOSIS — B182 Chronic viral hepatitis C: Secondary | ICD-10-CM

## 2018-07-23 ENCOUNTER — Ambulatory Visit (INDEPENDENT_AMBULATORY_CARE_PROVIDER_SITE_OTHER): Payer: Medicare HMO | Admitting: Family Medicine

## 2018-07-23 ENCOUNTER — Encounter: Payer: Self-pay | Admitting: Family Medicine

## 2018-07-23 VITALS — BP 138/91 | HR 86 | Temp 97.2°F | Ht 72.0 in | Wt 195.2 lb

## 2018-07-23 DIAGNOSIS — E1169 Type 2 diabetes mellitus with other specified complication: Secondary | ICD-10-CM

## 2018-07-23 DIAGNOSIS — I10 Essential (primary) hypertension: Secondary | ICD-10-CM | POA: Diagnosis not present

## 2018-07-23 DIAGNOSIS — F410 Panic disorder [episodic paroxysmal anxiety] without agoraphobia: Secondary | ICD-10-CM | POA: Diagnosis not present

## 2018-07-23 DIAGNOSIS — Z23 Encounter for immunization: Secondary | ICD-10-CM | POA: Diagnosis not present

## 2018-07-23 DIAGNOSIS — E039 Hypothyroidism, unspecified: Secondary | ICD-10-CM

## 2018-07-23 DIAGNOSIS — R351 Nocturia: Secondary | ICD-10-CM | POA: Diagnosis not present

## 2018-07-23 DIAGNOSIS — E785 Hyperlipidemia, unspecified: Secondary | ICD-10-CM | POA: Diagnosis not present

## 2018-07-23 DIAGNOSIS — Z794 Long term (current) use of insulin: Secondary | ICD-10-CM | POA: Diagnosis not present

## 2018-07-23 DIAGNOSIS — E1140 Type 2 diabetes mellitus with diabetic neuropathy, unspecified: Secondary | ICD-10-CM | POA: Diagnosis not present

## 2018-07-23 DIAGNOSIS — F3177 Bipolar disorder, in partial remission, most recent episode mixed: Secondary | ICD-10-CM

## 2018-07-23 DIAGNOSIS — E1165 Type 2 diabetes mellitus with hyperglycemia: Secondary | ICD-10-CM | POA: Diagnosis not present

## 2018-07-23 LAB — BAYER DCA HB A1C WAIVED: HB A1C (BAYER DCA - WAIVED): 7.6 % — ABNORMAL HIGH (ref <7.0)

## 2018-07-23 MED ORDER — EMPAGLIFLOZIN 10 MG PO TABS: 10.0000 mg | ORAL_TABLET | Freq: Every day | ORAL | 3 refills | Status: DC

## 2018-07-23 MED ORDER — OXYCODONE-ACETAMINOPHEN 5-325 MG PO TABS: 1.0000 | ORAL_TABLET | Freq: Three times a day (TID) | ORAL | 0 refills | Status: DC | PRN

## 2018-07-23 MED ORDER — MIRTAZAPINE 45 MG PO TABS: 45.0000 mg | ORAL_TABLET | Freq: Every day | ORAL | 1 refills | Status: DC

## 2018-07-23 MED ORDER — DULOXETINE HCL 60 MG PO CPEP: 120.0000 mg | ORAL_CAPSULE | Freq: Every day | ORAL | 3 refills | Status: DC

## 2018-07-23 MED ORDER — METFORMIN HCL 1000 MG PO TABS: 1000.0000 mg | ORAL_TABLET | Freq: Two times a day (BID) | ORAL | 3 refills | Status: DC

## 2018-07-23 MED ORDER — GABAPENTIN 300 MG PO CAPS: 300.0000 mg | ORAL_CAPSULE | Freq: Two times a day (BID) | ORAL | 3 refills | Status: DC

## 2018-07-23 MED ORDER — ALPRAZOLAM 0.5 MG PO TABS: 0.5000 mg | ORAL_TABLET | Freq: Every day | ORAL | 2 refills | Status: DC | PRN

## 2018-07-23 MED ORDER — GEMFIBROZIL 600 MG PO TABS: ORAL_TABLET | ORAL | 1 refills | Status: DC

## 2018-07-23 MED ORDER — TAMSULOSIN HCL 0.4 MG PO CAPS: 0.4000 mg | ORAL_CAPSULE | Freq: Every day | ORAL | 3 refills | Status: DC

## 2018-07-23 MED ORDER — INSULIN GLARGINE 100 UNIT/ML ~~LOC~~ SOLN: SUBCUTANEOUS | 3 refills | Status: DC

## 2018-07-23 MED ORDER — ALBUTEROL SULFATE HFA 108 (90 BASE) MCG/ACT IN AERS: INHALATION_SPRAY | RESPIRATORY_TRACT | 5 refills | Status: DC

## 2018-07-23 MED ORDER — LISINOPRIL 5 MG PO TABS: 5.0000 mg | ORAL_TABLET | Freq: Every day | ORAL | 3 refills | Status: DC

## 2018-07-23 MED ORDER — LEVOTHYROXINE SODIUM 50 MCG PO TABS: ORAL_TABLET | ORAL | 3 refills | Status: DC

## 2018-07-23 NOTE — Progress Notes (Signed)
BP (!) 138/91   Pulse 86   Temp (!) 97.2 F (36.2 C) (Oral)   Ht 6' (1.829 m)   Wt 195 lb 3.2 oz (88.5 kg)   BMI 26.47 kg/m    Subjective:    Patient ID: Jacob Bautista, male    DOB: Dec 07, 1960, 57 y.o.   MRN: [id number]  HPI: Jacob Bautista is a 57 y.o. male presenting on 07/23/2018 for Diabetes (3 month follow up) and Hypertension   HPI Hypertension Patient is currently on lisinopril, and their blood pressure today is 138/91. Patient denies any lightheadedness or dizziness. Patient denies headaches, blurred vision, chest pains, shortness of breath, or weakness. Denies any side effects from medication and is content with current medication.   Hypothyroidism recheck Patient is coming in for thyroid recheck today as well. They deny any issues with hair changes or heat or cold problems or diarrhea or constipation. They deny any chest pain or palpitations. They are currently on levothyroxine 25mcrograms   Type 2 diabetes mellitus Patient comes in today for recheck of his diabetes. Patient has been currently taking Lantus 50 and Novolin 6-10 twice daily to 3 times daily depending on how often eats meals and metformin. Patient is currently on an ACE inhibitor/ARB. Patient has not seen an ophthalmologist this year. Patient denies any issues with their feet.   Bipolar and panic disorder. Patient currently takes Cymbalta and alprazolam for his bipolar panic disorder and he says that he has been doing really well with it recently and actually wants to reduce the amount of alprazolam that he gets because he feels like he does not need it as frequently.  He denies any suicidal ideations.  Patient does have an extensive history of being in and out of incarceration which is led to trauma which is led to a lot of his panic attacks and anxiety.  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  He says it mostly works for him to like to reduce the amount of triazolam and increase the amount of oxycodone currently.  Patient says his pain is kept around 3 or 4 when he has the medication.  Relevant past medical, surgical, family and social history reviewed and updated as indicated. Interim medical history since our last visit reviewed. Allergies and medications reviewed and updated.  Review of Systems  Constitutional: Negative for chills and fever.  Eyes: Negative for visual disturbance.  Respiratory: Negative for shortness of breath and wheezing.   Cardiovascular: Negative for chest pain and leg swelling.  Musculoskeletal: Negative for back pain and gait problem.  Skin: Negative for rash.  Neurological: Negative for dizziness, weakness, light-headedness and numbness.  Psychiatric/Behavioral: Positive for decreased concentration and dysphoric mood. Negative for self-injury, sleep disturbance and suicidal ideas. The patient is nervous/anxious.   All other systems reviewed and are negative.   Per HPI unless specifically indicated above   Allergies as of 07/23/2018   No Known Allergies     Medication List        Accurate as of 07/23/18  9:37 AM. Always use your most recent med list.          accu-chek soft touch lancets 360 each by Other route 4 (four) times daily. Use as instructed  albuterol 108 (90 Base) MCG/ACT inhaler Commonly known as:  PROVENTIL HFA;VENTOLIN HFA INHALE 1 TO 2 PUFFS INTO LUNGS EVERY 4 HOURS AS NEEDED FOR WHEEZING OR SHORTNESS OF BREATH   ALPRAZolam 0.5 MG tablet Commonly known as:  XANAX Take 1 tablet (0.5 mg total) by mouth daily as needed for anxiety.   aspirin EC 81 MG tablet Take 81 mg by mouth daily.   DULoxetine 60 MG capsule Commonly known as:  CYMBALTA Take 2 capsules (120 mg total) by mouth daily.   gabapentin 300 MG capsule Commonly known as:   NEURONTIN TAKE ONE CAPSULE BY MOUTH TWICE DAILY   gemfibrozil 600 MG tablet Commonly known as:  LOPID TAKE 1 TABLET(600 MG) BY MOUTH TWICE DAILY BEFORE A MEAL   insulin regular 100 units/mL injection Commonly known as:  NOVOLIN R,HUMULIN R Inject 0.06-0.1 mLs (6-10 Units total) into the skin 2 (two) times daily before a meal. Sliding scale   Insulin Syringe-Needle U-100 31G X 5/16" 1 ML Misc 100 each by Does not apply route.   JARDIANCE 10 MG Tabs tablet Generic drug:  empagliflozin TAKE 1 TABLET BY MOUTH EVERY DAY   LANTUS 100 UNIT/ML injection Generic drug:  insulin glargine ADMINISTER 50 UNITS UNDER THE SKIN AT BEDTIME   levothyroxine 50 MCG tablet Commonly known as:  SYNTHROID, LEVOTHROID TAKE 1 TABLET BY MOUTH ONCE DAILY BEFORE BREAKFAST   lisinopril 5 MG tablet Commonly known as:  PRINIVIL,ZESTRIL TAKE 1 TABLET BY MOUTH EVERY DAY   metFORMIN 1000 MG tablet Commonly known as:  GLUCOPHAGE TAKE 1 TABLET BY MOUTH TWICE DAILY WITH MEALS   mirtazapine 45 MG tablet Commonly known as:  REMERON TAKE 1 TABLET BY MOUTH EVERY DAY AT BEDTIME   oxyCODONE-acetaminophen 5-325 MG tablet Commonly known as:  PERCOCET/ROXICET Take 1 tablet by mouth every 8 (eight) hours as needed for moderate pain or severe pain. Do not refill until 30 days from prescription date   oxyCODONE-acetaminophen 5-325 MG tablet Commonly known as:  PERCOCET/ROXICET Take 1 tablet by mouth every 8 (eight) hours as needed for severe pain. Do not refill until 60 days from prescription date   oxyCODONE-acetaminophen 5-325 MG tablet Commonly known as:  PERCOCET/ROXICET Take 1 tablet by mouth every 8 (eight) hours as needed for severe pain.          Objective:    BP (!) 138/91   Pulse 86   Temp (!) 97.2 F (36.2 C) (Oral)   Ht 6' (1.829 m)   Wt 195 lb 3.2 oz (88.5 kg)   BMI 26.47 kg/m   Wt Readings from Last 3 Encounters:  07/23/18 195 lb 3.2 oz (88.5 kg)  04/24/18 190 lb 12.8 oz (86.5 kg)    01/12/18 201 lb (91.2 kg)    Physical Exam  Constitutional: He is oriented to person, place, and time. He appears well-developed and well-nourished. No distress.  Eyes: Conjunctivae are normal. No scleral icterus.  Neck: Neck supple. No thyromegaly present.  Cardiovascular: Normal rate, regular rhythm, normal heart sounds and intact distal pulses.  No murmur heard. Pulmonary/Chest: Effort normal and breath sounds normal. No respiratory distress. He has no wheezes.  Musculoskeletal: Normal range of motion. He exhibits tenderness (Chronic low back pain and the neuropathy burning in both feet). He exhibits no edema.  Lymphadenopathy:    He has no cervical adenopathy.  Neurological: He is alert and oriented to person, place, and time. He exhibits normal muscle tone. Coordination normal.  Skin: Skin is warm and dry.  No rash noted. He is not diaphoretic.  Psychiatric: He has a normal mood and affect. His behavior is normal.  Nursing note and vitals reviewed.       Assessment & Plan:   Problem List Items Addressed This Visit      Endocrine   Type 2 diabetes mellitus with diabetic neuropathy, unspecified ([place])   Relevant Medications   lisinopril (PRINIVIL,ZESTRIL) 5 MG tablet   DULoxetine (CYMBALTA) 60 MG capsule   gabapentin (NEURONTIN) 300 MG capsule   oxyCODONE-acetaminophen (ROXICET) 5-325 MG tablet   empagliflozin (JARDIANCE) 10 MG TABS tablet   metFORMIN (GLUCOPHAGE) 1000 MG tablet   insulin glargine (LANTUS) 100 UNIT/ML injection   oxyCODONE-acetaminophen (PERCOCET/ROXICET) 5-325 MG tablet   oxyCODONE-acetaminophen (PERCOCET/ROXICET) 5-325 MG tablet   Other Relevant Orders   ToxASSURE Select 13 (MW), Urine   CMP14+EGFR   Lipid panel   Bayer DCA Hb A1c Waived   Hypothyroidism   Relevant Medications   levothyroxine (SYNTHROID, LEVOTHROID) 50 MCG tablet   Other Relevant Orders   TSH   Uncontrolled type 2 diabetes mellitus with hyperglycemia, with long-term current use of  insulin (HCC)   Relevant Medications   lisinopril (PRINIVIL,ZESTRIL) 5 MG tablet   DULoxetine (CYMBALTA) 60 MG capsule   gabapentin (NEURONTIN) 300 MG capsule   empagliflozin (JARDIANCE) 10 MG TABS tablet   metFORMIN (GLUCOPHAGE) 1000 MG tablet   insulin glargine (LANTUS) 100 UNIT/ML injection     Other   Bipolar disorder (HCC)   Relevant Orders   CBC with Differential/Platelet   Panic disorder   Relevant Medications   ALPRAZolam (XANAX) 0.5 MG tablet   mirtazapine (REMERON) 45 MG tablet   DULoxetine (CYMBALTA) 60 MG capsule    Other Visit Diagnoses    Hyperlipidemia associated with type 2 diabetes mellitus ([place])    -  Primary   Relevant Medications   lisinopril (PRINIVIL,ZESTRIL) 5 MG tablet   empagliflozin (JARDIANCE) 10 MG TABS tablet   metFORMIN (GLUCOPHAGE) 1000 MG tablet   gemfibrozil (LOPID) 600 MG tablet   insulin glargine (LANTUS) 100 UNIT/ML injection   Other Relevant Orders   Lipid panel   Essential hypertension, benign       Relevant Medications   lisinopril (PRINIVIL,ZESTRIL) 5 MG tablet   gemfibrozil (LOPID) 600 MG tablet   Other Relevant Orders   CBC with Differential/Platelet   CMP14+EGFR   Nocturia       Relevant Medications   tamsulosin (FLOMAX) 0.4 MG CAPS capsule   Other Relevant Orders   PSA, total and free      Will start Flomax for nocturia  Increase the number of his Norco's by 5/month and decrease Xanax by 10/month the patient is agreeable towards this as he says is not needing the Xanax anymore and the biggest concern was the interaction between the 2.  He says his pain has increased during the wintertime Follow up plan: Return in about 3 months (around 10/23/2018), or if symptoms worsen or fail to improve, for Diabetes and hypertension and thyroid.  Counseling provided for all of the vaccine components No orders of the defined types were placed in this encounter.   Caryl Pina, MD [facility] 07/23/2018, 9:37  AM

## 2018-07-24 LAB — LIPID PANEL
CHOL/HDL RATIO: 3.9 ratio (ref 0.0–5.0)
Cholesterol, Total: 133 mg/dL (ref 100–199)
HDL: 34 mg/dL — ABNORMAL LOW (ref >39)
LDL CALC: 70 mg/dL (ref 0–99)
Triglycerides: 144 mg/dL (ref 0–149)
VLDL CHOLESTEROL CAL: 29 mg/dL (ref 5–40)

## 2018-07-24 LAB — CBC WITH DIFFERENTIAL/PLATELET
BASOS: 0 %
Basophils Absolute: 0 10*3/uL (ref 0.0–0.2)
EOS (ABSOLUTE): 0.3 10*3/uL (ref 0.0–0.4)
EOS: 3 %
HEMATOCRIT: 43.9 % (ref 37.5–51.0)
Hemoglobin: 14.4 g/dL (ref 13.0–17.7)
Immature Grans (Abs): 0 10*3/uL (ref 0.0–0.1)
Immature Granulocytes: 0 %
LYMPHS ABS: 2.4 10*3/uL (ref 0.7–3.1)
Lymphs: 27 %
MCH: 28.7 pg (ref 26.6–33.0)
MCHC: 32.8 g/dL (ref 31.5–35.7)
MCV: 88 fL (ref 79–97)
MONOS ABS: 0.7 10*3/uL (ref 0.1–0.9)
Monocytes: 7 %
Neutrophils Absolute: 5.5 10*3/uL (ref 1.4–7.0)
Neutrophils: 63 %
Platelets: 249 10*3/uL (ref 150–450)
RBC: 5.01 x10E6/uL (ref 4.14–5.80)
RDW: 13.1 % (ref 12.3–15.4)
WBC: 9 10*3/uL (ref 3.4–10.8)

## 2018-07-24 LAB — PSA, TOTAL AND FREE
PROSTATE SPECIFIC AG, SERUM: 0.3 ng/mL (ref 0.0–4.0)
PSA FREE PCT: 50 %
PSA FREE: 0.15 ng/mL

## 2018-07-24 LAB — CMP14+EGFR
A/G RATIO: 2 (ref 1.2–2.2)
ALBUMIN: 4.9 g/dL (ref 3.5–5.5)
ALK PHOS: 97 IU/L (ref 39–117)
ALT: 27 IU/L (ref 0–44)
AST: 32 IU/L (ref 0–40)
BUN/Creatinine Ratio: 20 (ref 9–20)
BUN: 16 mg/dL (ref 6–24)
Bilirubin Total: 0.4 mg/dL (ref 0.0–1.2)
CALCIUM: 10 mg/dL (ref 8.7–10.2)
CO2: 18 mmol/L — ABNORMAL LOW (ref 20–29)
CREATININE: 0.8 mg/dL (ref 0.76–1.27)
Chloride: 100 mmol/L (ref 96–106)
GFR, EST AFRICAN AMERICAN: 115 mL/min/{1.73_m2} (ref >59)
GFR, EST NON AFRICAN AMERICAN: 99 mL/min/{1.73_m2} (ref >59)
GLOBULIN, TOTAL: 2.4 g/dL (ref 1.5–4.5)
Glucose: 140 mg/dL — ABNORMAL HIGH (ref 65–99)
Potassium: 4.5 mmol/L (ref 3.5–5.2)
SODIUM: 137 mmol/L (ref 134–144)
Total Protein: 7.3 g/dL (ref 6.0–8.5)

## 2018-07-24 LAB — TSH: TSH: 2.21 u[IU]/mL (ref 0.450–4.500)

## 2018-07-27 ENCOUNTER — Ambulatory Visit: Payer: Medicare HMO | Admitting: Family Medicine

## 2018-07-29 DIAGNOSIS — H25811 Combined forms of age-related cataract, right eye: Secondary | ICD-10-CM | POA: Diagnosis not present

## 2018-07-29 DIAGNOSIS — H2512 Age-related nuclear cataract, left eye: Secondary | ICD-10-CM | POA: Diagnosis not present

## 2018-07-29 DIAGNOSIS — H52221 Regular astigmatism, right eye: Secondary | ICD-10-CM | POA: Diagnosis not present

## 2018-07-29 LAB — TOXASSURE SELECT 13 (MW), URINE

## 2018-07-30 DIAGNOSIS — J439 Emphysema, unspecified: Secondary | ICD-10-CM | POA: Diagnosis not present

## 2018-08-03 ENCOUNTER — Ambulatory Visit (HOSPITAL_COMMUNITY): Payer: Medicare HMO

## 2018-08-10 ENCOUNTER — Ambulatory Visit (HOSPITAL_COMMUNITY): Payer: Medicare HMO

## 2018-08-10 ENCOUNTER — Ambulatory Visit (HOSPITAL_COMMUNITY): Admission: RE | Admit: 2018-08-10 | Payer: Medicare HMO | Source: Ambulatory Visit

## 2018-09-30 ENCOUNTER — Observation Stay (HOSPITAL_COMMUNITY)
Admission: EM | Admit: 2018-09-30 | Discharge: 2018-10-01 | Disposition: A | Discharge status: Home / Self Care | Payer: Medicare HMO | Attending: Family Medicine | Admitting: Family Medicine

## 2018-09-30 ENCOUNTER — Other Ambulatory Visit: Payer: Self-pay

## 2018-09-30 ENCOUNTER — Encounter (HOSPITAL_COMMUNITY): Payer: Self-pay | Admitting: *Deleted

## 2018-09-30 ENCOUNTER — Emergency Department (HOSPITAL_COMMUNITY): Payer: Medicare HMO

## 2018-09-30 DIAGNOSIS — F319 Bipolar disorder, unspecified: Secondary | ICD-10-CM | POA: Diagnosis not present

## 2018-09-30 DIAGNOSIS — R55 Syncope and collapse: Secondary | ICD-10-CM

## 2018-09-30 DIAGNOSIS — Z833 Family history of diabetes mellitus: Secondary | ICD-10-CM | POA: Insufficient documentation

## 2018-09-30 DIAGNOSIS — Z79890 Hormone replacement therapy (postmenopausal): Secondary | ICD-10-CM | POA: Insufficient documentation

## 2018-09-30 DIAGNOSIS — R112 Nausea with vomiting, unspecified: Secondary | ICD-10-CM

## 2018-09-30 DIAGNOSIS — F3177 Bipolar disorder, in partial remission, most recent episode mixed: Secondary | ICD-10-CM

## 2018-09-30 DIAGNOSIS — I9589 Other hypotension: Secondary | ICD-10-CM

## 2018-09-30 DIAGNOSIS — E860 Dehydration: Secondary | ICD-10-CM | POA: Diagnosis not present

## 2018-09-30 DIAGNOSIS — E1165 Type 2 diabetes mellitus with hyperglycemia: Secondary | ICD-10-CM | POA: Diagnosis not present

## 2018-09-30 DIAGNOSIS — E039 Hypothyroidism, unspecified: Secondary | ICD-10-CM | POA: Diagnosis not present

## 2018-09-30 DIAGNOSIS — Z79891 Long term (current) use of opiate analgesic: Secondary | ICD-10-CM | POA: Insufficient documentation

## 2018-09-30 DIAGNOSIS — Z794 Long term (current) use of insulin: Secondary | ICD-10-CM | POA: Diagnosis not present

## 2018-09-30 DIAGNOSIS — B182 Chronic viral hepatitis C: Secondary | ICD-10-CM | POA: Diagnosis not present

## 2018-09-30 DIAGNOSIS — R197 Diarrhea, unspecified: Secondary | ICD-10-CM

## 2018-09-30 DIAGNOSIS — F17210 Nicotine dependence, cigarettes, uncomplicated: Secondary | ICD-10-CM | POA: Insufficient documentation

## 2018-09-30 DIAGNOSIS — E1159 Type 2 diabetes mellitus with other circulatory complications: Secondary | ICD-10-CM | POA: Diagnosis not present

## 2018-09-30 DIAGNOSIS — F4310 Post-traumatic stress disorder, unspecified: Secondary | ICD-10-CM | POA: Diagnosis not present

## 2018-09-30 DIAGNOSIS — Z79899 Other long term (current) drug therapy: Secondary | ICD-10-CM | POA: Diagnosis not present

## 2018-09-30 DIAGNOSIS — R0689 Other abnormalities of breathing: Secondary | ICD-10-CM | POA: Diagnosis not present

## 2018-09-30 DIAGNOSIS — I10 Essential (primary) hypertension: Secondary | ICD-10-CM | POA: Diagnosis not present

## 2018-09-30 DIAGNOSIS — I4510 Unspecified right bundle-branch block: Secondary | ICD-10-CM | POA: Diagnosis not present

## 2018-09-30 DIAGNOSIS — Z7982 Long term (current) use of aspirin: Secondary | ICD-10-CM | POA: Diagnosis not present

## 2018-09-30 DIAGNOSIS — G8929 Other chronic pain: Secondary | ICD-10-CM | POA: Insufficient documentation

## 2018-09-30 DIAGNOSIS — I959 Hypotension, unspecified: Secondary | ICD-10-CM

## 2018-09-30 DIAGNOSIS — R61 Generalized hyperhidrosis: Secondary | ICD-10-CM | POA: Diagnosis not present

## 2018-09-30 DIAGNOSIS — E1140 Type 2 diabetes mellitus with diabetic neuropathy, unspecified: Secondary | ICD-10-CM | POA: Diagnosis not present

## 2018-09-30 DIAGNOSIS — I152 Hypertension secondary to endocrine disorders: Secondary | ICD-10-CM | POA: Diagnosis present

## 2018-09-30 DIAGNOSIS — E861 Hypovolemia: Secondary | ICD-10-CM

## 2018-09-30 DIAGNOSIS — Z8249 Family history of ischemic heart disease and other diseases of the circulatory system: Secondary | ICD-10-CM | POA: Diagnosis not present

## 2018-09-30 DIAGNOSIS — R739 Hyperglycemia, unspecified: Secondary | ICD-10-CM | POA: Diagnosis not present

## 2018-09-30 DIAGNOSIS — R4020 Unspecified coma: Secondary | ICD-10-CM | POA: Diagnosis not present

## 2018-09-30 HISTORY — DX: Other chronic pain: G89.29

## 2018-09-30 HISTORY — DX: Dorsalgia, unspecified: M54.9

## 2018-09-30 HISTORY — DX: Bipolar disorder, unspecified: F31.9

## 2018-09-30 LAB — COMPREHENSIVE METABOLIC PANEL
ALT: 23 U/L (ref 0–44)
AST: 36 U/L (ref 15–41)
Albumin: 4 g/dL (ref 3.5–5.0)
Alkaline Phosphatase: 69 U/L (ref 38–126)
Anion gap: 12 (ref 5–15)
BUN: 15 mg/dL (ref 6–20)
CO2: 19 mmol/L — AB (ref 22–32)
Calcium: 9.2 mg/dL (ref 8.9–10.3)
Chloride: 106 mmol/L (ref 98–111)
Creatinine, Ser: 1.09 mg/dL (ref 0.61–1.24)
GFR calc Af Amer: >60 mL/min (ref >60)
GFR calc non Af Amer: >60 mL/min (ref >60)
Glucose, Bld: 77 mg/dL (ref 70–99)
Potassium: 3.8 mmol/L (ref 3.5–5.1)
Sodium: 137 mmol/L (ref 135–145)
Total Bilirubin: 0.2 mg/dL — ABNORMAL LOW (ref 0.3–1.2)
Total Protein: 7.1 g/dL (ref 6.5–8.1)

## 2018-09-30 LAB — CBC WITH DIFFERENTIAL/PLATELET
ABS IMMATURE GRANULOCYTES: 0.03 10*3/uL (ref 0.00–0.07)
BASOS ABS: 0 10*3/uL (ref 0.0–0.1)
Basophils Relative: 0 %
Eosinophils Absolute: 0.2 10*3/uL (ref 0.0–0.5)
Eosinophils Relative: 2 %
HCT: 41 % (ref 39.0–52.0)
Hemoglobin: 12.7 g/dL — ABNORMAL LOW (ref 13.0–17.0)
IMMATURE GRANULOCYTES: 0 %
Lymphocytes Relative: 17 %
Lymphs Abs: 1.8 10*3/uL (ref 0.7–4.0)
MCH: 28 pg (ref 26.0–34.0)
MCHC: 31 g/dL (ref 30.0–36.0)
MCV: 90.3 fL (ref 80.0–100.0)
Monocytes Absolute: 0.6 10*3/uL (ref 0.1–1.0)
Monocytes Relative: 6 %
NEUTROS ABS: 7.6 10*3/uL (ref 1.7–7.7)
Neutrophils Relative %: 75 %
Platelets: 203 10*3/uL (ref 150–400)
RBC: 4.54 MIL/uL (ref 4.22–5.81)
RDW: 13.7 % (ref 11.5–15.5)
WBC: 10.3 10*3/uL (ref 4.0–10.5)
nRBC: 0 % (ref 0.0–0.2)

## 2018-09-30 LAB — CBG MONITORING, ED: Glucose-Capillary: 129 mg/dL — ABNORMAL HIGH (ref 70–99)

## 2018-09-30 LAB — TROPONIN I: Troponin I: <0.03 ng/mL (ref <0.03)

## 2018-09-30 LAB — LIPASE, BLOOD: Lipase: 26 U/L (ref 11–51)

## 2018-09-30 MED ORDER — INSULIN ASPART 100 UNIT/ML ~~LOC~~ SOLN: 0.0000 [IU] | Freq: Three times a day (TID) | SUBCUTANEOUS | Status: DC

## 2018-09-30 MED ORDER — SODIUM CHLORIDE 0.9 % IV BOLUS
1000.0000 mL | Freq: Once | INTRAVENOUS | Status: AC
  Administered 2018-09-30: 1000 mL via INTRAVENOUS

## 2018-09-30 MED ORDER — ONDANSETRON HCL 4 MG PO TABS: 4.0000 mg | ORAL_TABLET | Freq: Four times a day (QID) | ORAL | Status: DC | PRN

## 2018-09-30 MED ORDER — ONDANSETRON HCL 4 MG/2ML IJ SOLN: 4.0000 mg | Freq: Four times a day (QID) | INTRAMUSCULAR | Status: DC | PRN

## 2018-09-30 MED ORDER — SODIUM CHLORIDE 0.9 % IV SOLN
INTRAVENOUS | Status: DC
  Administered 2018-09-30 – 2018-10-01 (×2): via INTRAVENOUS

## 2018-09-30 MED ORDER — ACETAMINOPHEN 650 MG RE SUPP: 650.0000 mg | Freq: Four times a day (QID) | RECTAL | Status: DC | PRN

## 2018-09-30 MED ORDER — ACETAMINOPHEN 325 MG PO TABS: 650.0000 mg | ORAL_TABLET | Freq: Four times a day (QID) | ORAL | Status: DC | PRN

## 2018-09-30 MED ORDER — SODIUM CHLORIDE 0.9 % IV SOLN: INTRAVENOUS | Status: DC

## 2018-09-30 NOTE — ED Provider Notes (Signed)
[facility] EMERGENCY DEPARTMENT Provider Note   CSN: [id number] Arrival date & time: 09/30/18  1927     History   Chief Complaint Chief Complaint  Patient presents with  . Near Syncope    HPI Jacob Bautista is a 58 y.o. male.  HPI Pt was seen at 1940. Per EMS and pt report: c/o gradual onset and worsening of persistent lightheadedness that began earlier today. Has been associated with generalized weakness. Pt states he has had multiple intermittent episodes of N/V/D for the past 3 days. N/V has improved today, but his diarrhea continues. 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 Pt states his symptoms of weakness and lightheadedness when trying to stand worsened throughout the day as he "tried to work."  EMS gave IV NS 1L bolus en route with BP improving from "70/40" to "124/62." SBP again 70's by arrival to ED.  Denies CP/SOB, no cough, no abd pain, no black or blood in stools or emesis, no fevers, no rash, no back pain, no focal motor weakness, no tingling/numbness in extremities.   Past Medical History:  Diagnosis Date  . Bipolar disorder (HCC)   . BPH (benign prostatic hyperplasia)   . Chronic back pain   . Depression    PTSD  . Diabetes mellitus without complication (HCC)   . Hypertension   . Thyroid disease     Patient Active Problem List   Diagnosis Date Noted  . Chronic hepatitis C without hepatic coma (HCC) 11/17/2017  . Cigarette smoker 11/17/2017  . Uncontrolled type 2 diabetes mellitus with hyperglycemia, with long-term current use of insulin (HCC) 10/14/2016  . Type 2 diabetes mellitus with diabetic neuropathy, unspecified (HCC) 09/27/2016  . Hypertension associated with diabetes (HCC) 09/27/2016  . Hypothyroidism 09/27/2016  . Bipolar disorder (HCC) 09/27/2016  . BPH (benign prostatic hyperplasia) 09/27/2016  . Panic disorder 09/27/2016    Past Surgical History:  Procedure Laterality Date  . CATARACT EXTRACTION W/  INTRAOCULAR LENS IMPLANT Left   . FACIAL RECONSTRUCTION SURGERY    . HERNIA REPAIR     x 2  . left hand surg    . SCROTAL SURGERY          Home Medications    Prior to Admission medications   Medication Sig Start Date End Date Taking? Authorizing Provider  albuterol (PROVENTIL HFA;VENTOLIN HFA) 108 (90 Base) MCG/ACT inhaler INHALE 1 TO 2 PUFFS INTO LUNGS EVERY 4 HOURS AS NEEDED FOR WHEEZING OR SHORTNESS OF BREATH 07/23/18   Dettinger, Elige Radon, MD  ALPRAZolam Prudy Feeler) 0.5 MG tablet Take 1 tablet (0.5 mg total) by mouth daily as needed for anxiety. 07/23/18   Dettinger, Elige Radon, MD  aspirin EC 81 MG tablet Take 81 mg by mouth daily.    [provider]  DULoxetine (CYMBALTA) 60 MG capsule Take 2 capsules (120 mg total) by mouth daily. 07/23/18   Dettinger, Elige Radon, MD  empagliflozin (JARDIANCE) 10 MG TABS tablet Take 10 mg by mouth daily. 07/23/18   Dettinger, Elige Radon, MD  gabapentin (NEURONTIN) 300 MG capsule Take 1 capsule (300 mg total) by mouth 2 (two) times daily. 07/23/18   Dettinger, Elige Radon, MD  gemfibrozil (LOPID) 600 MG tablet TAKE 1 TABLET(600 MG) BY MOUTH TWICE DAILY BEFORE A MEAL 07/23/18   Dettinger, Elige Radon, MD  insulin glargine (LANTUS) 100 UNIT/ML injection ADMINISTER 50 UNITS UNDER THE SKIN AT BEDTIME 07/23/18   Dettinger, Elige Radon, MD  insulin regular (NOVOLIN  R,HUMULIN R) 100 units/mL injection Inject 0.06-0.1 mLs (6-10 Units total) into the skin 2 (two) times daily before a meal. Sliding scale 07/07/17   Dettinger, Elige RadonJoshua A, MD  Insulin Syringe-Needle U-100 31G X 5/16" 1 ML MISC 100 each by Does not apply route.    [provider]  Lancets (ACCU-CHEK SOFT TOUCH) lancets 360 each by Other route 4 (four) times daily. Use as instructed    [provider]  levothyroxine (SYNTHROID, LEVOTHROID) 50 MCG tablet TAKE 1 TABLET BY MOUTH ONCE DAILY BEFORE BREAKFAST 07/23/18   Dettinger, Elige RadonJoshua A, MD  lisinopril (PRINIVIL,ZESTRIL) 5 MG tablet Take 1  tablet (5 mg total) by mouth daily. 07/23/18   Dettinger, Elige RadonJoshua A, MD  metFORMIN (GLUCOPHAGE) 1000 MG tablet Take 1 tablet (1,000 mg total) by mouth 2 (two) times daily with a meal. 07/23/18   Dettinger, Elige RadonJoshua A, MD  mirtazapine (REMERON) 45 MG tablet Take 1 tablet (45 mg total) by mouth at bedtime. 07/23/18   Dettinger, Elige RadonJoshua A, MD  oxyCODONE-acetaminophen (PERCOCET/ROXICET) 5-325 MG tablet Take 1 tablet by mouth every 8 (eight) hours as needed for moderate pain or severe pain. Do not refill until 30 days from prescription date 07/23/18   Dettinger, Elige RadonJoshua A, MD  oxyCODONE-acetaminophen (PERCOCET/ROXICET) 5-325 MG tablet Take 1 tablet by mouth every 8 (eight) hours as needed for severe pain. Do not refill until 60 days from prescription date 07/23/18   Dettinger, Elige RadonJoshua A, MD  oxyCODONE-acetaminophen (ROXICET) 5-325 MG tablet Take 1 tablet by mouth every 8 (eight) hours as needed for severe pain. 07/23/18   Dettinger, Elige RadonJoshua A, MD  tamsulosin (FLOMAX) 0.4 MG CAPS capsule Take 1 capsule (0.4 mg total) by mouth daily. 07/23/18   Dettinger, Elige RadonJoshua A, MD    Family History Family History  Problem Relation Age of Onset  . Heart disease Father   . Diabetes Father   . Hypertension Father   . Stroke Father   . Stroke Brother     Social History Social History   Tobacco Use  . Smoking status: Current Every Day Smoker    Packs/day: 1.00    Types: Cigarettes  . Smokeless tobacco: Current User    Types: Chew  Substance Use Topics  . Alcohol use: Yes    Comment: social  . Drug use: Yes    Frequency: 3.0 times per week    Types: Marijuana    Comment: today     Allergies   Patient has no known allergies.   Review of Systems Review of Systems ROS: Statement: All systems negative except as marked or noted in the HPI; Constitutional: Negative for fever and chills. ; ; Eyes: Negative for eye pain, redness and discharge. ; ; ENMT: Negative for ear pain, hoarseness, nasal congestion, sinus  pressure and sore throat. ; ; Cardiovascular: Negative for chest pain, palpitations, diaphoresis, dyspnea and peripheral edema. ; ; Respiratory: Negative for cough, wheezing and stridor. ; ; Gastrointestinal: +N/V/D. Negative for abdominal pain, blood in stool, hematemesis, jaundice and rectal bleeding. . ; ; Genitourinary: Negative for dysuria, flank pain and hematuria. ; ; Musculoskeletal: Negative for back pain and neck pain. Negative for swelling and trauma.; ; Skin: Negative for pruritus, rash, abrasions, blisters, bruising and skin lesion.; ; Neuro: +generalized weakness, lightheadedness. Negative for headache and neck stiffness. Negative for altered level of consciousness, altered mental status, extremity weakness, paresthesias, involuntary movement, seizure and syncope.       Physical Exam Updated Vital Signs BP (!) 88/62  Pulse 69   Resp 12   Ht 5' 11.5" (1.816 m)   Wt 88.5 kg   SpO2 100%   BMI 26.82 kg/m   Patient Vitals for the past 24 hrs:  BP Temp Temp src Pulse Resp SpO2 Height Weight  09/30/18 2145 96/64 - - 72 (!) 21 100 % - -  09/30/18 2127 - (!) 97.5 F (36.4 C) Oral - - - - -  09/30/18 2115 93/63 - - 73 12 99 % - -  09/30/18 2100 (!) 92/59 - - 68 (!) 9 95 % - -  09/30/18 2045 (!) 86/62 - - 67 15 97 % - -  09/30/18 2030 (!) 88/62 - - 69 12 100 % - -  09/30/18 2015 98/64 - - 74 15 100 % - -  09/30/18 2007 - - - 78 20 95 % - -  09/30/18 2000 (!) 84/60 - - 76 13 98 % - -  09/30/18 1945 (!) 82/54 - - 72 10 99 % - -  09/30/18 1937 - - - 75 19 100 % - -  09/30/18 1933 (!) 80/54 - - 74 (!) 22 98 % - -  09/30/18 1931 - - - - - - 5' 11.5" (1.816 m) 88.5 kg  09/30/18 1930 (!) 78/50 - - 76 - 99 % - -      Physical Exam 1945: Physical examination:  Nursing notes reviewed; Vital signs and O2 SAT reviewed;  Constitutional: Well developed, Well nourished, In no acute distress; Head:  Normocephalic, atraumatic; Eyes: EOMI, PERRL, No scleral icterus; ENMT: Mouth and pharynx  normal, Mucous membranes dry; Neck: Supple, Full range of motion, No lymphadenopathy; Cardiovascular: Regular rate and rhythm, No gallop; Respiratory: Breath sounds clear & equal bilaterally, No wheezes.  Speaking full sentences with ease, Normal respiratory effort/excursion; Chest: Nontender, Movement normal; Abdomen: Soft, Nontender, Nondistended, Normal bowel sounds; Genitourinary: No CVA tenderness; Extremities: Peripheral pulses normal, No tenderness, No edema, No calf edema or asymmetry.; Neuro: AA&Ox3, Major CN grossly intact. No facial droop.  Speech clear. Grips equal. Strength 4/5 equal bilat UE's and LE's. No gross focal motor or sensory deficits in extremities.; Skin: Color normal, Warm, Dry.   ED Treatments / Results  Labs (all labs ordered are listed, but only abnormal results are displayed)   EKG EKG Interpretation  Date/Time:  Wednesday September 30 2018 19:32:05 EST Ventricular Rate:  75 PR Interval:    QRS Duration: 114 QT Interval:  419 QTC Calculation: 468 R Axis:   75 Text Interpretation:  Sinus rhythm Incomplete right bundle branch block When compared with ECG of 12/31/2017 No significant change was found Confirmed by Samuel Jester [phone]) on 09/30/2018 8:43:22 PM   Radiology   Procedures Procedures (including critical care time)  Medications Ordered in ED Medications  0.9 %  sodium chloride infusion (has no administration in time range)  sodium chloride 0.9 % bolus 1,000 mL (1,000 mLs Intravenous New Bag/Given 09/30/18 1946)     Initial Impression / Assessment and Plan / ED Course  I have reviewed the triage vital signs and the nursing notes.  Pertinent labs & imaging results that were available during my care of the patient were reviewed by me and considered in my medical decision making (see chart for details).  MDM Reviewed: previous chart, nursing note and vitals Reviewed previous: labs and ECG Interpretation: labs, ECG and x-ray Total time  providing critical care: 30-74 minutes. This excludes time spent performing separately reportable procedures and services. Consults:  admitting MD   CRITICAL CARE Performed by: Samuel Jester Total critical care time: 35 minutes Critical care time was exclusive of separately billable procedures and treating other patients. Critical care was necessary to treat or prevent imminent or life-threatening deterioration. Critical care was time spent personally by me on the following activities: development of treatment plan with patient and/or surrogate as well as nursing, discussions with consultants, evaluation of patient's response to treatment, examination of patient, obtaining history from patient or surrogate, ordering and performing treatments and interventions, ordering and review of laboratory studies, ordering and review of radiographic studies, pulse oximetry and re-evaluation of patient's condition.   Results for orders placed or performed during the hospital encounter of 09/30/18  Comprehensive metabolic panel  Result Value Ref Range   Sodium 137 135 - 145 mmol/L   Potassium 3.8 3.5 - 5.1 mmol/L   Chloride 106 98 - 111 mmol/L   CO2 19 (L) 22 - 32 mmol/L   Glucose, Bld 77 70 - 99 mg/dL   BUN 15 6 - 20 mg/dL   Creatinine, Ser 1.61 0.61 - 1.24 mg/dL   Calcium 9.2 8.9 - 09.6 mg/dL   Total Protein 7.1 6.5 - 8.1 g/dL   Albumin 4.0 3.5 - 5.0 g/dL   AST 36 15 - 41 U/L   ALT 23 0 - 44 U/L   Alkaline Phosphatase 69 38 - 126 U/L   Total Bilirubin 0.2 (L) 0.3 - 1.2 mg/dL   GFR calc non Af Amer >60 >60 mL/min   GFR calc Af Amer >60 >60 mL/min   Anion gap 12 5 - 15  Lipase, blood  Result Value Ref Range   Lipase 26 11 - 51 U/L  Troponin I - Once  Result Value Ref Range   Troponin I <0.03 <0.03 ng/mL  CBC with Differential  Result Value Ref Range   WBC 10.3 4.0 - 10.5 K/uL   RBC 4.54 4.22 - 5.81 MIL/uL   Hemoglobin 12.7 (L) 13.0 - 17.0 g/dL   HCT 04.5 40.9 - 81.1 %   MCV 90.3 80.0  - 100.0 fL   MCH 28.0 26.0 - 34.0 pg   MCHC 31.0 30.0 - 36.0 g/dL   RDW 91.4 78.2 - 95.6 %   Platelets 203 150 - 400 K/uL   nRBC 0.0 0.0 - 0.2 %   Neutrophils Relative % 75 %   Neutro Abs 7.6 1.7 - 7.7 K/uL   Lymphocytes Relative 17 %   Lymphs Abs 1.8 0.7 - 4.0 K/uL   Monocytes Relative 6 %   Monocytes Absolute 0.6 0.1 - 1.0 K/uL   Eosinophils Relative 2 %   Eosinophils Absolute 0.2 0.0 - 0.5 K/uL   Basophils Relative 0 %   Basophils Absolute 0.0 0.0 - 0.1 K/uL   Immature Granulocytes 0 %   Abs Immature Granulocytes 0.03 0.00 - 0.07 K/uL  CBG monitoring, ED  Result Value Ref Range   Glucose-Capillary 129 (H) 70 - 99 mg/dL   Dg Abd Acute W/chest Result Date: 09/30/2018 CLINICAL DATA:  Near syncopal episode. Hyperglycemia and hypotension. EXAM: DG ABDOMEN ACUTE W/ 1V CHEST COMPARISON:  Radiographs 10/13/2016.  Radiographs 10/14/2016. FINDINGS: The heart size and mediastinal contours are stable. The lungs are clear. There is no pleural effusion or pneumothorax. Evidence of old left AC joint and coracoclavicular ligament injury noted. The bowel gas pattern is nonobstructive. There is stool throughout the colon. Small pelvic calcifications are likely phleboliths. No acute osseous findings are seen within the  abdomen. IMPRESSION: No evidence of acute cardiopulmonary or abdominal process. Nonobstructive bowel gas pattern. Electronically Signed   By: Carey BullocksWilliam  Veazey M.D.   On: 09/30/2018 20:21    2215:  #3 IVF NS 1L bolus infusing. Pt still unable to urinate; low BP and lightheadedness mildly improved but continues. No stooling while in the ED. Abd remains benign. Will observation admit. T/C returned from [facility] Dr. Laural BenesJohnson, case discussed, including:  HPI, pertinent PM/SHx, VS/PE, dx testing, ED course and treatment:  Agreeable to come to ED for evaluation to admit.    Final Clinical Impressions(s) / ED Diagnoses   Final diagnoses:  None    ED Discharge Orders    None       Samuel JesterMcManus,  Deandrea Vanpelt, DO 10/05/18 1223

## 2018-09-30 NOTE — ED Notes (Signed)
Blood pressure is still too low for orthostatics.

## 2018-09-30 NOTE — ED Triage Notes (Signed)
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

## 2018-09-30 NOTE — ED Notes (Signed)
Pt returned from xray

## 2018-10-01 DIAGNOSIS — E860 Dehydration: Secondary | ICD-10-CM | POA: Diagnosis not present

## 2018-10-01 LAB — URINALYSIS, ROUTINE W REFLEX MICROSCOPIC
BILIRUBIN URINE: NEGATIVE
Glucose, UA: >=500 mg/dL — AB
Hgb urine dipstick: NEGATIVE
Ketones, ur: NEGATIVE mg/dL
Leukocytes, UA: NEGATIVE
NITRITE: NEGATIVE
Protein, ur: NEGATIVE mg/dL
Specific Gravity, Urine: 1.021 (ref 1.005–1.030)
pH: 5 (ref 5.0–8.0)

## 2018-10-01 LAB — CBG MONITORING, ED: Glucose-Capillary: 81 mg/dL (ref 70–99)

## 2018-10-01 MED ORDER — CANAGLIFLOZIN 100 MG PO TABS
100.0000 mg | ORAL_TABLET | Freq: Every day | ORAL | Status: DC
  Administered 2018-10-01: 100 mg via ORAL
  Filled 2018-10-01 (×2): qty 1

## 2018-10-01 MED ORDER — DULOXETINE HCL 30 MG PO CPEP
120.0000 mg | ORAL_CAPSULE | Freq: Every day | ORAL | Status: DC
  Administered 2018-10-01: 120 mg via ORAL
  Filled 2018-10-01: qty 4

## 2018-10-01 MED ORDER — INSULIN GLARGINE 100 UNIT/ML ~~LOC~~ SOLN: 50.0000 [IU] | Freq: Every day | SUBCUTANEOUS | Status: DC

## 2018-10-01 MED ORDER — TAMSULOSIN HCL 0.4 MG PO CAPS
0.4000 mg | ORAL_CAPSULE | Freq: Every morning | ORAL | Status: DC
  Administered 2018-10-01 (×3): 0.4 mg via ORAL
  Filled 2018-10-01: qty 1

## 2018-10-01 MED ORDER — GEMFIBROZIL 600 MG PO TABS
600.0000 mg | ORAL_TABLET | Freq: Two times a day (BID) | ORAL | Status: DC
  Administered 2018-10-01: 600 mg via ORAL
  Filled 2018-10-01 (×3): qty 1

## 2018-10-01 MED ORDER — OXYCODONE-ACETAMINOPHEN 5-325 MG PO TABS: 1.0000 | ORAL_TABLET | Freq: Three times a day (TID) | ORAL | Status: DC | PRN

## 2018-10-01 MED ORDER — INSULIN GLARGINE 100 UNIT/ML ~~LOC~~ SOLN
50.0000 [IU] | Freq: Every day | SUBCUTANEOUS | Status: DC
  Filled 2018-10-01: qty 0.5

## 2018-10-01 MED ORDER — LEVOTHYROXINE SODIUM 50 MCG PO TABS
50.0000 ug | ORAL_TABLET | Freq: Every day | ORAL | Status: DC
  Administered 2018-10-01: 50 ug via ORAL

## 2018-10-01 MED ORDER — ALBUTEROL SULFATE (2.5 MG/3ML) 0.083% IN NEBU: 3.0000 mL | INHALATION_SOLUTION | RESPIRATORY_TRACT | Status: DC | PRN

## 2018-10-01 MED ORDER — ASPIRIN EC 81 MG PO TBEC
81.0000 mg | DELAYED_RELEASE_TABLET | Freq: Every day | ORAL | Status: DC
  Administered 2018-10-01: 81 mg via ORAL
  Filled 2018-10-01: qty 1

## 2018-10-01 MED ORDER — MIRTAZAPINE 15 MG PO TABS
45.0000 mg | ORAL_TABLET | Freq: Every day | ORAL | Status: DC
  Administered 2018-10-01: 45 mg via ORAL
  Filled 2018-10-01: qty 3

## 2018-10-01 MED ORDER — GABAPENTIN 300 MG PO CAPS
300.0000 mg | ORAL_CAPSULE | Freq: Two times a day (BID) | ORAL | Status: DC
  Administered 2018-10-01 (×2): 300 mg via ORAL
  Filled 2018-10-01 (×2): qty 1

## 2018-10-01 MED ORDER — ALPRAZOLAM 0.5 MG PO TABS: 0.5000 mg | ORAL_TABLET | Freq: Every day | ORAL | Status: DC | PRN

## 2018-10-01 NOTE — H&P (Signed)
H&P        History and Physical    Jacob Filbertlbert D Wenger WJX:[id number] DOB: 04/27/1961 DOA: 09/30/2018  PCP: Dettinger, Elige RadonJoshua A, MD  Patient coming from: home  I have personally briefly reviewed patient's old medical records [facility]  Chief Complaint: weak  HPI: Jacob Bautista is a 58 y.o. male with medical history significant of Dm 2, bipolar, chronic back pain presents with weakness.  Patient had 3 days of nausea vomiting and diarrhea.  He did have some abdominal cramping in the lower part of his abdomen that is now resolved.  Denies any fever.  Denies any obvious sick contacts.  Denies any hospitalizations or change in medications.  Today he was able to go out and he ate some toast.  He went out to help his son feed some cattle then he became very weak.  When the fire department arrived his blood pressure was low in the 70s over 40s.  Blood sugar was 156.  He got a liter of normal saline in route. ED Course: Was hypotensive in the 90s once he got here.  He received additional liters of IV fluids and his blood pressure currently is in the 120s over 70 range.  He does not have hypertension at baseline.  He is on ACE inhibitor for renal protection.  Patient had a nonacute abdominal and chest x-ray.  No leukocytosis.  Afebrile and not tachycardic.  Review of Systems: Positive for nausea vomiting diarrhea abdominal cramping now resolved and generalized weakness and dizziness   Past Medical History:  Diagnosis Date  . Bipolar disorder (HCC)   . BPH (benign prostatic hyperplasia)   . Chronic back pain   . Depression    PTSD  . Diabetes mellitus without complication (HCC)   . Hypertension   . Thyroid disease     Past Surgical History:  Procedure Laterality Date  . CATARACT EXTRACTION W/ INTRAOCULAR LENS IMPLANT Left   . FACIAL RECONSTRUCTION SURGERY    . HERNIA REPAIR     x 2  . left hand surg    . SCROTAL SURGERY       reports that he has been smoking cigarettes. He has  been smoking about 1.00 pack per day. His smokeless tobacco use includes chew. He reports current alcohol use. He reports current drug use. Frequency: 3.00 times per week. Drug: Marijuana.  No Known Allergies  Family History  Problem Relation Age of Onset  . Heart disease Father   . Diabetes Father   . Hypertension Father   . Stroke Father   . Stroke Brother     Prior to Admission medications   Medication Sig Start Date End Date Taking? Authorizing Provider  albuterol (PROVENTIL HFA;VENTOLIN HFA) 108 (90 Base) MCG/ACT inhaler INHALE 1 TO 2 PUFFS INTO LUNGS EVERY 4 HOURS AS NEEDED FOR WHEEZING OR SHORTNESS OF BREATH Patient taking differently: Inhale 1-2 puffs into the lungs every 4 (four) hours as needed for wheezing or shortness of breath.  07/23/18  Yes Dettinger, Elige RadonJoshua A, MD  ALPRAZolam Prudy Feeler(XANAX) 0.5 MG tablet Take 1 tablet (0.5 mg total) by mouth daily as needed for anxiety. 07/23/18  Yes Dettinger, Elige RadonJoshua A, MD  aspirin EC 81 MG tablet Take 81 mg by mouth daily.   Yes [provider]  DULoxetine (CYMBALTA) 60 MG capsule Take 2 capsules (120 mg total) by mouth daily. 07/23/18  Yes Dettinger, Elige RadonJoshua A, MD  empagliflozin (JARDIANCE) 10 MG TABS tablet Take 10 mg by  mouth daily. 07/23/18  Yes Dettinger, Elige Radon, MD  gabapentin (NEURONTIN) 300 MG capsule Take 1 capsule (300 mg total) by mouth 2 (two) times daily. 07/23/18  Yes Dettinger, Elige Radon, MD  gemfibrozil (LOPID) 600 MG tablet TAKE 1 TABLET(600 MG) BY MOUTH TWICE DAILY BEFORE A MEAL Patient taking differently: Take 600 mg by mouth 2 (two) times daily before a meal.  07/23/18  Yes Dettinger, Elige Radon, MD  insulin glargine (LANTUS) 100 UNIT/ML injection ADMINISTER 50 UNITS UNDER THE SKIN AT BEDTIME Patient taking differently: Inject 50 Units into the skin at bedtime.  07/23/18  Yes Dettinger, Elige Radon, MD  insulin regular (NOVOLIN R,HUMULIN R) 100 units/mL injection Inject 0.06-0.1 mLs (6-10 Units total) into the skin 2 (two)  times daily before a meal. Sliding scale Patient taking differently: Inject 6-10 Units into the skin 2 (two) times daily before a meal. Sliding scale For levels over 150 07/07/17  Yes Dettinger, Elige Radon, MD  levothyroxine (SYNTHROID, LEVOTHROID) 50 MCG tablet TAKE 1 TABLET BY MOUTH ONCE DAILY BEFORE BREAKFAST Patient taking differently: Take 50 mcg by mouth daily before breakfast.  07/23/18  Yes Dettinger, Elige Radon, MD  lisinopril (PRINIVIL,ZESTRIL) 5 MG tablet Take 1 tablet (5 mg total) by mouth daily. 07/23/18  Yes Dettinger, Elige Radon, MD  metFORMIN (GLUCOPHAGE) 1000 MG tablet Take 1 tablet (1,000 mg total) by mouth 2 (two) times daily with a meal. 07/23/18  Yes Dettinger, Elige Radon, MD  mirtazapine (REMERON) 45 MG tablet Take 1 tablet (45 mg total) by mouth at bedtime. 07/23/18  Yes Dettinger, Elige Radon, MD  oxyCODONE-acetaminophen (PERCOCET/ROXICET) 5-325 MG tablet Take 1 tablet by mouth every 8 (eight) hours as needed for moderate pain or severe pain. Do not refill until 30 days from prescription date Patient taking differently: Take 1 tablet by mouth every 8 (eight) hours. Do not refill until 30 days from prescription date 07/23/18  Yes Dettinger, Elige Radon, MD  tamsulosin (FLOMAX) 0.4 MG CAPS capsule Take 1 capsule (0.4 mg total) by mouth daily. Patient taking differently: Take 0.4 mg by mouth every morning.  07/23/18  Yes Dettinger, Elige Radon, MD  oxyCODONE-acetaminophen (PERCOCET/ROXICET) 5-325 MG tablet Take 1 tablet by mouth every 8 (eight) hours as needed for severe pain. Do not refill until 60 days from prescription date Patient not taking: Reported on 09/30/2018 07/23/18   Dettinger, Elige Radon, MD  oxyCODONE-acetaminophen (ROXICET) 5-325 MG tablet Take 1 tablet by mouth every 8 (eight) hours as needed for severe pain. Patient not taking: Reported on 09/30/2018 07/23/18   Dettinger, Elige Radon, MD    Physical Exam: Vitals:   09/30/18 2330 09/30/18 2345 10/01/18 0030 10/01/18 0100  BP: 110/71  123/71 103/62 109/65  Pulse: 69 83 73 76  Resp: 17 20 16 13   Temp:      TempSrc:      SpO2: 99% 98% 95% 100%  Weight:      Height:        Constitutional: NAD, calm, comfortable Vitals:   09/30/18 2330 09/30/18 2345 10/01/18 0030 10/01/18 0100  BP: 110/71 123/71 103/62 109/65  Pulse: 69 83 73 76  Resp: 17 20 16 13   Temp:      TempSrc:      SpO2: 99% 98% 95% 100%  Weight:      Height:       Eyes: PERRL, lids and conjunctivae normal ENMT: Mucous membranes are moist.  Respiratory: clear to auscultation bilaterally, no wheezing, no crackles. Normal respiratory effort. No  accessory muscle use.  Cardiovascular: Regular rate and rhythm, no murmurs / rubs / gallops. No extremity edema.  Abdomen: no tenderness, no masses palpated.  Bowel sounds positive.  Psychiatric: Normal judgment and insight. Alert and oriented x 3. Normal mood.     Labs on Admission: I have personally reviewed following labs and imaging studies  CBC: Recent Labs  Lab 09/30/18 2051  WBC 10.3  NEUTROABS 7.6  HGB 12.7*  HCT 41.0  MCV 90.3  PLT 203   Basic Metabolic Panel: Recent Labs  Lab 09/30/18 2051  NA 137  K 3.8  CL 106  CO2 19*  GLUCOSE 77  BUN 15  CREATININE 1.09  CALCIUM 9.2   GFR: Estimated Creatinine Clearance: 80.9 mL/min (by C-G formula based on SCr of 1.09 mg/dL). Liver Function Tests: Recent Labs  Lab 09/30/18 2051  AST 36  ALT 23  ALKPHOS 69  BILITOT 0.2*  PROT 7.1  ALBUMIN 4.0   Recent Labs  Lab 09/30/18 2051  LIPASE 26   No results for input(s): AMMONIA in the last 168 hours. Coagulation Profile: No results for input(s): INR, PROTIME in the last 168 hours. Cardiac Enzymes: Recent Labs  Lab 09/30/18 2051  TROPONINI <0.03   BNP (last 3 results) No results for input(s): PROBNP in the last 8760 hours. HbA1C: No results for input(s): HGBA1C in the last 72 hours. CBG: Recent Labs  Lab 09/30/18 1937  GLUCAP 129*   Lipid Profile: No results for input(s):  CHOL, HDL, LDLCALC, TRIG, CHOLHDL, LDLDIRECT in the last 72 hours. Thyroid Function Tests: No results for input(s): TSH, T4TOTAL, FREET4, T3FREE, THYROIDAB in the last 72 hours. Anemia Panel: No results for input(s): VITAMINB12, FOLATE, FERRITIN, TIBC, IRON, RETICCTPCT in the last 72 hours. Urine analysis:    Component Value Date/Time   COLORURINE YELLOW 09/30/2018 2354   APPEARANCEUR CLEAR 09/30/2018 2354   LABSPEC 1.021 09/30/2018 2354   PHURINE 5.0 09/30/2018 2354   GLUCOSEU >=500 (A) 09/30/2018 2354   HGBUR NEGATIVE 09/30/2018 2354   BILIRUBINUR NEGATIVE 09/30/2018 2354   KETONESUR NEGATIVE 09/30/2018 2354   PROTEINUR NEGATIVE 09/30/2018 2354   NITRITE NEGATIVE 09/30/2018 2354   LEUKOCYTESUR NEGATIVE 09/30/2018 2354    Radiological Exams on Admission: Dg Abd Acute W/chest  Result Date: 09/30/2018 CLINICAL DATA:  Near syncopal episode. Hyperglycemia and hypotension. EXAM: DG ABDOMEN ACUTE W/ 1V CHEST COMPARISON:  Radiographs 10/13/2016.  Radiographs 10/14/2016. FINDINGS: The heart size and mediastinal contours are stable. The lungs are clear. There is no pleural effusion or pneumothorax. Evidence of old left AC joint and coracoclavicular ligament injury noted. The bowel gas pattern is nonobstructive. There is stool throughout the colon. Small pelvic calcifications are likely phleboliths. No acute osseous findings are seen within the abdomen. IMPRESSION: No evidence of acute cardiopulmonary or abdominal process. Nonobstructive bowel gas pattern. Electronically Signed   By: Carey BullocksWilliam  Veazey M.D.   On: 09/30/2018 20:21    EKG: Independently reviewed.  Sinus rhythm right bundle branch block  Assessment/Plan Principal Problem:   Dehydration Active Problems:   Hypotension   Type 2 diabetes mellitus with diabetic neuropathy, unspecified (HCC)   Hypertension associated with diabetes (HCC)   Hypothyroidism   Bipolar disorder (HCC)   Chronic hepatitis C without hepatic coma (HCC)   -  supportive care gentle IV fluids pressure much improved.  Hold ACE inhibitor -Cont  home basal insulin with sliding scale insulin we will hold prandial insulin until he is eating fully -Continue home medications for hypothyroidism bipolar  disorder and chronic pain.  DVT prophylaxis: SCDs, early ambulation Code Status: Full  Disposition Plan: home tomorrow  Admission status: obs med Synetta Fail MD [facility] Pager [phone]  If 7PM-7AM, please contact night-coverage [email or web address] Password [email or web address]  10/01/2018, 1:34 AM

## 2018-10-01 NOTE — ED Notes (Signed)
Pt ambulated independently in hall, steady gait, states he is ready to go home

## 2018-10-01 NOTE — ED Notes (Signed)
Pt given sandwich and chips with a ginger ale

## 2018-10-01 NOTE — Discharge Summary (Signed)
Physician Discharge Summary  Jacob Bautista [id number] DOB: 04/18/1961 DOA: 09/30/2018  PCP: Dettinger, Elige RadonJoshua A, MD  Admit date: 09/30/2018 Discharge date: 10/07/2018  Time spent: 20 minutes  Recommendations for Outpatient Follow-up:  1. 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 Ems called-found to by hypo BP 70/40 and had neg imaging  Hospital Course:  He was given fluid boluses in the ED and had reciovered significantly byt the time that I saw him in the ED Felt was approp for him to go on home with caveat that he needs labs soona nd should take it slow    Discharge Exam: Vitals:   10/01/18 0600 10/01/18 0630  BP: 111/68 106/76  Pulse: 69 65  Resp: 14 12  Temp:    SpO2: 96% 95%    General: awake alert pleasan tjoking no distress Walked around the unit no dizzy weak feeling No cp Cardiovascular: s1  s2no m/r/g Respiratory: clear no added sound abd sfot nt nd no rebound no guard Neuro power intact sensory and reflex deferred  Discharge Instructions   Discharge Instructions    Diet - low sodium heart healthy   Complete by:  As directed    Discharge instructions   Complete by:  As directed    Continue all meds but drink lots of water and keep hydrated for the next 3-4 days Go to your doctor and get labs in about 1 week Take it slow--u can drink some coffee Try to quit smoking    Increase activity slowly   Complete by:  As directed      Allergies as of 10/01/2018   No Known Allergies     Medication List    TAKE these medications   albuterol 108 (90 Base) MCG/ACT inhaler Commonly known as:  PROVENTIL HFA;VENTOLIN HFA INHALE 1 TO 2 PUFFS INTO LUNGS EVERY 4 HOURS AS NEEDED FOR WHEEZING OR SHORTNESS OF BREATH What changed:    how much to take  how to take this  when to take this  reasons to take this  additional instructions   ALPRAZolam 0.5 MG tablet Commonly known as:  XANAX Take 1 tablet (0.5 mg total) by mouth daily as needed for anxiety.   aspirin EC 81 MG tablet Take 81 mg by mouth daily.   DULoxetine 60 MG capsule Commonly known as:  CYMBALTA Take 2 capsules (120 mg total) by mouth daily.   empagliflozin 10 MG Tabs tablet Commonly known as:  JARDIANCE Take 10 mg by mouth daily.   gabapentin 300 MG capsule Commonly known as:  NEURONTIN Take 1 capsule (300 mg total) by mouth 2 (two) times daily.   gemfibrozil 600 MG tablet Commonly known as:  LOPID TAKE 1 TABLET(600 MG) BY MOUTH TWICE DAILY BEFORE A MEAL What changed:    how much to take  how to take this  when to  take this  additional instructions   insulin glargine 100 UNIT/ML injection Commonly known as:  LANTUS ADMINISTER 50 UNITS UNDER THE SKIN AT BEDTIME What changed:    how much to take  how to take this  when to take this  additional instructions   insulin regular 100 units/mL injection Commonly known as:  NOVOLIN R,HUMULIN R Inject 0.06-0.1 mLs (6-10 Units total) into the skin 2 (two) times daily before a meal. Sliding scale What changed:  additional instructions   levothyroxine 50 MCG tablet Commonly known as:  SYNTHROID, LEVOTHROID TAKE 1 TABLET BY MOUTH ONCE DAILY BEFORE BREAKFAST What changed:    how much to take  how to take this  when to take this  additional instructions   lisinopril 5 MG tablet Commonly known as:   PRINIVIL,ZESTRIL Take 1 tablet (5 mg total) by mouth daily.   metFORMIN 1000 MG tablet Commonly known as:  GLUCOPHAGE Take 1 tablet (1,000 mg total) by mouth 2 (two) times daily with a meal.   mirtazapine 45 MG tablet Commonly known as:  REMERON Take 1 tablet (45 mg total) by mouth at bedtime.   oxyCODONE-acetaminophen 5-325 MG tablet Commonly known as:  ROXICET Take 1 tablet by mouth every 8 (eight) hours as needed for severe pain. What changed:  Another medication with the same name was changed. Make sure you understand how and when to take each.   oxyCODONE-acetaminophen 5-325 MG tablet Commonly known as:  PERCOCET/ROXICET Take 1 tablet by mouth every 8 (eight) hours as needed for moderate pain or severe pain. Do not refill until 30 days from prescription date What changed:  when to take this   oxyCODONE-acetaminophen 5-325 MG tablet Commonly known as:  PERCOCET/ROXICET Take 1 tablet by mouth every 8 (eight) hours as needed for severe pain. Do not refill until 60 days from prescription date What changed:  Another medication with the same name was changed. Make sure you understand how and when to take each.   tamsulosin 0.4 MG Caps capsule Commonly known as:  FLOMAX Take 1 capsule (0.4 mg total) by mouth daily. What changed:  when to take this      No Known Allergies    The results of significant diagnostics from this hospitalization (including imaging, microbiology, ancillary and laboratory) are listed below for reference.    Significant Diagnostic Studies: Dg Abd Acute W/chest  Result Date: 09/30/2018 CLINICAL DATA:  Near syncopal episode. Hyperglycemia and hypotension. EXAM: DG ABDOMEN ACUTE W/ 1V CHEST COMPARISON:  Radiographs 10/13/2016.  Radiographs 10/14/2016. FINDINGS: The heart size and mediastinal contours are stable. The lungs are clear. There is no pleural effusion or pneumothorax. Evidence of old left AC joint and coracoclavicular ligament injury noted. The  bowel gas pattern is nonobstructive. There is stool throughout the colon. Small pelvic calcifications are likely phleboliths. No acute osseous findings are seen within the abdomen. IMPRESSION: No evidence of acute cardiopulmonary or abdominal process. Nonobstructive bowel gas pattern. Electronically Signed   By: Carey Bullocks M.D.   On: 09/30/2018 20:21    Microbiology: No results found for this or any previous visit (from the past 240 hour(s)).   Labs: Basic Metabolic Panel: Recent Labs  Lab 09/30/18 2051  NA 137  K 3.8  CL 106  CO2 19*  GLUCOSE 77  BUN 15  CREATININE 1.09  CALCIUM 9.2   Liver Function Tests: Recent Labs  Lab 09/30/18 2051  AST 36  ALT 23  ALKPHOS 69  BILITOT 0.2*  PROT  7.1  ALBUMIN 4.0   Recent Labs  Lab 09/30/18 2051  LIPASE 26   No results for input(s): AMMONIA in the last 168 hours. CBC: Recent Labs  Lab 09/30/18 2051 10/02/18 1216  WBC 10.3 5.4  NEUTROABS 7.6 2.5  HGB 12.7* 12.1*  HCT 41.0 36.0*  MCV 90.3 87  PLT 203 200   Cardiac Enzymes: Recent Labs  Lab 09/30/18 2051  TROPONINI <0.03   BNP: BNP (last 3 results) No results for input(s): BNP in the last 8760 hours.  ProBNP (last 3 results) No results for input(s): PROBNP in the last 8760 hours.  CBG: Recent Labs  Lab 09/30/18 1937 10/01/18 0754  GLUCAP 129* 81       Signed:  Rhetta Mura MD   [facility] 10/07/2018, 5:44 PM

## 2018-10-02 ENCOUNTER — Encounter: Payer: Self-pay | Admitting: Family Medicine

## 2018-10-02 ENCOUNTER — Ambulatory Visit (INDEPENDENT_AMBULATORY_CARE_PROVIDER_SITE_OTHER): Payer: Medicare HMO | Admitting: Family Medicine

## 2018-10-02 VITALS — BP 129/78 | HR 81 | Temp 98.0°F | Ht 71.5 in | Wt 200.4 lb

## 2018-10-02 DIAGNOSIS — K219 Gastro-esophageal reflux disease without esophagitis: Secondary | ICD-10-CM | POA: Diagnosis not present

## 2018-10-02 DIAGNOSIS — R195 Other fecal abnormalities: Secondary | ICD-10-CM | POA: Diagnosis not present

## 2018-10-02 DIAGNOSIS — R55 Syncope and collapse: Secondary | ICD-10-CM | POA: Diagnosis not present

## 2018-10-02 LAB — HIV ANTIBODY (ROUTINE TESTING W REFLEX): HIV Screen 4th Generation wRfx: NONREACTIVE

## 2018-10-02 MED ORDER — PANTOPRAZOLE SODIUM 40 MG PO TBEC: 40.0000 mg | DELAYED_RELEASE_TABLET | Freq: Every day | ORAL | 3 refills | Status: DC

## 2018-10-02 NOTE — Progress Notes (Signed)
BP 129/78   Pulse 81   Temp 98 F (36.7 C) (Oral)   Ht 5' 11.5" (1.816 m)   Wt 200 lb 6.4 oz (90.9 kg)   BMI 27.56 kg/m    Subjective:    Patient ID: Jacob Bautista, male    DOB: [birth year], 58 y.o.   MRN: [id number]  HPI: Jacob Bautista is a 58 y.o. male presenting on 10/02/2018 for Hospitalization Follow-up (1/22 AP- Syncope and N & V. Patient states since leaving the hospital he has not had any more problems except blood in stool which he states happens sometimes.) and Gastroesophageal Reflux (Patient states when he burps it smells bad and taste sour.)   HPI Patient comes in for hospital/ER follow-up.  He was in [facility] on 09/30/2018 and was taken there initially for syncope/possible near-syncope and nausea and vomiting.  He says that he had felt progressively weak over the day before and the day of the hospital visit and said that he had been having some darker stools but no bright red blood.  He felt lightheaded and weak and had to sit down on a couple different occasions because he felt so weak.  Patient does complain of some upper abdominal pain associated with this.  He does admit that he has been using Goody powders a lot recently for his arthritis.  Since leaving the hospital he has not had any more weakness or near syncopal episodes but he still has a couple of dark tarry stools that have happened.  Today in the office he denies any chest pain or palpitations or flutters or lightheadedness or dizziness.  Patient has not been on anything for acid reflux for at least the past few months.  He said he did have one episode of emesis prior to the hospitalization as well.  Relevant past medical, surgical, family and social history reviewed and updated as indicated. Interim medical history since our last visit reviewed. Allergies and medications reviewed and updated.  Review of Systems  Constitutional: Negative for chills and fever.  Respiratory: Negative for  shortness of breath and wheezing.   Cardiovascular: Negative for chest pain and leg swelling.  Gastrointestinal: Positive for abdominal pain, blood in stool and nausea. Negative for constipation, diarrhea and vomiting.  Genitourinary: Negative for difficulty urinating, dysuria, flank pain and frequency.  Musculoskeletal: Negative for back pain and gait problem.  Skin: Negative for rash.  Neurological: Positive for dizziness, syncope and light-headedness. Negative for tremors, seizures, speech difficulty and weakness.  All other systems reviewed and are negative.   Per HPI unless specifically indicated above   Allergies as of 10/02/2018   No Known Allergies     Medication List       Accurate as of October 02, 2018 12:19 PM. Always use your most recent med list.        albuterol 108 (90 Base) MCG/ACT inhaler Commonly known as:  PROVENTIL HFA;VENTOLIN HFA INHALE 1 TO 2 PUFFS INTO LUNGS EVERY 4 HOURS AS NEEDED FOR WHEEZING OR SHORTNESS OF BREATH   ALPRAZolam 0.5 MG tablet Commonly known as:  XANAX Take 1 tablet (0.5 mg total) by mouth daily as needed for anxiety.   aspirin EC 81 MG tablet Take 81 mg by mouth daily.   DULoxetine 60 MG capsule Commonly known as:  CYMBALTA Take 2 capsules (120 mg total) by mouth daily.   empagliflozin 10 MG Tabs tablet Commonly known as:  JARDIANCE Take 10 mg by mouth daily.  gabapentin 300 MG capsule Commonly known as:  NEURONTIN Take 1 capsule (300 mg total) by mouth 2 (two) times daily.   gemfibrozil 600 MG tablet Commonly known as:  LOPID TAKE 1 TABLET(600 MG) BY MOUTH TWICE DAILY BEFORE A MEAL   insulin glargine 100 UNIT/ML injection Commonly known as:  LANTUS ADMINISTER 50 UNITS UNDER THE SKIN AT BEDTIME   insulin regular 100 units/mL injection Commonly known as:  NOVOLIN R,HUMULIN R Inject 0.06-0.1 mLs (6-10 Units total) into the skin 2 (two) times daily before a meal. Sliding scale   levothyroxine 50 MCG tablet Commonly  known as:  SYNTHROID, LEVOTHROID TAKE 1 TABLET BY MOUTH ONCE DAILY BEFORE BREAKFAST   lisinopril 5 MG tablet Commonly known as:  PRINIVIL,ZESTRIL Take 1 tablet (5 mg total) by mouth daily.   metFORMIN 1000 MG tablet Commonly known as:  GLUCOPHAGE Take 1 tablet (1,000 mg total) by mouth 2 (two) times daily with a meal.   mirtazapine 45 MG tablet Commonly known as:  REMERON Take 1 tablet (45 mg total) by mouth at bedtime.   oxyCODONE-acetaminophen 5-325 MG tablet Commonly known as:  ROXICET Take 1 tablet by mouth every 8 (eight) hours as needed for severe pain.   oxyCODONE-acetaminophen 5-325 MG tablet Commonly known as:  PERCOCET/ROXICET Take 1 tablet by mouth every 8 (eight) hours as needed for moderate pain or severe pain. Do not refill until 30 days from prescription date   oxyCODONE-acetaminophen 5-325 MG tablet Commonly known as:  PERCOCET/ROXICET Take 1 tablet by mouth every 8 (eight) hours as needed for severe pain. Do not refill until 60 days from prescription date   pantoprazole 40 MG tablet Commonly known as:  PROTONIX Take 1 tablet (40 mg total) by mouth daily.   tamsulosin 0.4 MG Caps capsule Commonly known as:  FLOMAX Take 1 capsule (0.4 mg total) by mouth daily.          Objective:    BP 129/78   Pulse 81   Temp 98 F (36.7 C) (Oral)   Ht 5' 11.5" (1.816 m)   Wt 200 lb 6.4 oz (90.9 kg)   BMI 27.56 kg/m   Wt Readings from Last 3 Encounters:  10/02/18 200 lb 6.4 oz (90.9 kg)  09/30/18 195 lb (88.5 kg)  07/23/18 195 lb 3.2 oz (88.5 kg)    Physical Exam Vitals signs and nursing note reviewed.  Constitutional:      General: He is not in acute distress.    Appearance: He is well-developed. He is not diaphoretic.  Eyes:     General: No scleral icterus.    Conjunctiva/sclera: Conjunctivae normal.  Neck:     Musculoskeletal: Neck supple.     Thyroid: No thyromegaly.  Cardiovascular:     Rate and Rhythm: Normal rate and regular rhythm.     Heart  sounds: Normal heart sounds. No murmur.  Pulmonary:     Effort: Pulmonary effort is normal. No respiratory distress.     Breath sounds: Normal breath sounds. No wheezing.  Abdominal:     General: Abdomen is flat. Bowel sounds are normal.     Palpations: Abdomen is soft.     Tenderness: There is abdominal tenderness (Epigastric abdominal tenderness). There is no right CVA tenderness, left CVA tenderness, guarding or rebound.  Lymphadenopathy:     Cervical: No cervical adenopathy.  Skin:    General: Skin is warm and dry.     Findings: No rash.  Neurological:     Mental Status:  He is alert and oriented to person, place, and time.     Coordination: Coordination normal.  Psychiatric:        Behavior: Behavior normal.        Assessment & Plan:   Problem List Items Addressed This Visit      Digestive   GERD (gastroesophageal reflux disease)   Relevant Medications   pantoprazole (PROTONIX) 40 MG tablet   Other Relevant Orders   CBC with Differential/Platelet (Completed)   Fecal occult blood, imunochemical   Ambulatory referral to Gastroenterology    Other Visit Diagnoses    Near syncope    -  Primary   Relevant Medications   pantoprazole (PROTONIX) 40 MG tablet   Other Relevant Orders   CBC with Differential/Platelet (Completed)   Ambulatory referral to Gastroenterology   Dark stools       Relevant Medications   pantoprazole (PROTONIX) 40 MG tablet   Other Relevant Orders   CBC with Differential/Platelet (Completed)   Fecal occult blood, imunochemical   Ambulatory referral to Gastroenterology       Follow up plan: Return if symptoms worsen or fail to improve.  Counseling provided for all of the vaccine components Orders Placed This Encounter  Procedures  . Fecal occult blood, imunochemical  . CBC with Differential/Platelet  . Ambulatory referral to Gastroenterology    Arville CareJoshua Crespin Forstrom, MD [facility] 10/02/2018, 12:19 PM

## 2018-10-03 LAB — CBC WITH DIFFERENTIAL/PLATELET
Basophils Absolute: 0 10*3/uL (ref 0.0–0.2)
Basos: 1 %
EOS (ABSOLUTE): 0.3 10*3/uL (ref 0.0–0.4)
Eos: 6 %
Hematocrit: 36 % — ABNORMAL LOW (ref 37.5–51.0)
Hemoglobin: 12.1 g/dL — ABNORMAL LOW (ref 13.0–17.7)
Immature Grans (Abs): 0 10*3/uL (ref 0.0–0.1)
Immature Granulocytes: 0 %
Lymphocytes Absolute: 2.1 10*3/uL (ref 0.7–3.1)
Lymphs: 39 %
MCH: 29.2 pg (ref 26.6–33.0)
MCHC: 33.6 g/dL (ref 31.5–35.7)
MCV: 87 fL (ref 79–97)
MONOCYTES: 8 %
Monocytes Absolute: 0.4 10*3/uL (ref 0.1–0.9)
Neutrophils Absolute: 2.5 10*3/uL (ref 1.4–7.0)
Neutrophils: 46 %
Platelets: 200 10*3/uL (ref 150–450)
RBC: 4.15 x10E6/uL (ref 4.14–5.80)
RDW: 13.3 % (ref 11.6–15.4)
WBC: 5.4 10*3/uL (ref 3.4–10.8)

## 2018-10-08 ENCOUNTER — Encounter: Payer: Self-pay | Admitting: Internal Medicine

## 2018-10-12 ENCOUNTER — Encounter: Payer: Self-pay | Admitting: *Deleted

## 2018-10-23 ENCOUNTER — Ambulatory Visit (INDEPENDENT_AMBULATORY_CARE_PROVIDER_SITE_OTHER): Payer: Medicare HMO | Admitting: Family Medicine

## 2018-10-23 ENCOUNTER — Encounter: Payer: Self-pay | Admitting: Family Medicine

## 2018-10-23 VITALS — BP 122/76 | HR 77 | Temp 97.3°F | Ht 71.5 in | Wt 191.8 lb

## 2018-10-23 DIAGNOSIS — I10 Essential (primary) hypertension: Secondary | ICD-10-CM | POA: Diagnosis not present

## 2018-10-23 DIAGNOSIS — Z794 Long term (current) use of insulin: Secondary | ICD-10-CM | POA: Diagnosis not present

## 2018-10-23 DIAGNOSIS — E1159 Type 2 diabetes mellitus with other circulatory complications: Secondary | ICD-10-CM

## 2018-10-23 DIAGNOSIS — E039 Hypothyroidism, unspecified: Secondary | ICD-10-CM | POA: Diagnosis not present

## 2018-10-23 DIAGNOSIS — R6889 Other general symptoms and signs: Secondary | ICD-10-CM | POA: Diagnosis not present

## 2018-10-23 DIAGNOSIS — F410 Panic disorder [episodic paroxysmal anxiety] without agoraphobia: Secondary | ICD-10-CM

## 2018-10-23 DIAGNOSIS — K219 Gastro-esophageal reflux disease without esophagitis: Secondary | ICD-10-CM | POA: Diagnosis not present

## 2018-10-23 DIAGNOSIS — E1140 Type 2 diabetes mellitus with diabetic neuropathy, unspecified: Secondary | ICD-10-CM | POA: Diagnosis not present

## 2018-10-23 MED ORDER — OXYCODONE-ACETAMINOPHEN 5-325 MG PO TABS: 1.0000 | ORAL_TABLET | Freq: Three times a day (TID) | ORAL | 0 refills | Status: DC | PRN

## 2018-10-23 MED ORDER — ALPRAZOLAM 0.5 MG PO TABS: 0.5000 mg | ORAL_TABLET | Freq: Every day | ORAL | 2 refills | Status: DC | PRN

## 2018-10-23 MED ORDER — OSELTAMIVIR PHOSPHATE 75 MG PO CAPS: 75.0000 mg | ORAL_CAPSULE | Freq: Two times a day (BID) | ORAL | 0 refills | Status: DC

## 2018-10-23 NOTE — Progress Notes (Signed)
BP 122/76   Pulse 77   Temp (!) 97.3 F (36.3 C) (Oral)   Ht 5' 11.5" (1.816 m)   Wt 191 lb 12.8 oz (87 kg)   BMI 26.38 kg/m    Subjective:    Patient ID: Jacob Bautista, male    DOB: 1961-02-05, 58 y.o.   MRN: [id number]  HPI: DAMARIOUS Bautista is a 58 y.o. male presenting on 10/23/2018 for Diabetes (3 month follow up) and Chills (Patient states he woke up like this.)   HPI Fevers and sweats and chills Patient comes in complaining of fever and sweats and chills and cough that is been going on since this morning.  Since he had an appointment coming to see [place] anyways he wanted to discuss this at the same time.  He denies any sick contacts that he knows of.  He has not used anything as he just started this morning.  He did not take his temperature at home so does not know what the fever was but his temperature here in the office is 97.3.  He was concerned about whether or not he could have influenza.  He says he just generally feels like crud.  Type 2 diabetes mellitus Patient comes in today for recheck of his diabetes. Patient has been currently taking metformin and Jardiance and Lantus and Novolin. Patient is currently on an ACE inhibitor/ARB. Patient has not seen an ophthalmologist this year. Patient denies any issues with their feet.   Hypothyroidism recheck Patient is coming in for thyroid recheck today as well. They deny any issues with hair changes or heat or cold problems or diarrhea or constipation. They deny any chest pain or palpitations. They are currently on levothyroxine 68mcrograms   Hypertension Patient is currently on lisinopril, and their blood pressure today is 122/76. Patient denies any lightheadedness or dizziness. Patient denies headaches, blurred vision, chest pains, shortness of breath, or weakness. Denies any side effects from medication and is content with current medication.   GERD Patient is currently on pantoprazole.  She denies any major symptoms or  abdominal pain or belching or burping. She denies any blood in her stool or lightheadedness or dizziness.   Anxiety and panic disorder recheck Patient currently takes alprazolam and Cymbalta for anxiety and panic disorder.  He is currently on 0.5 of Xanax daily as needed and he does use it about daily.  He says is the Cymbalta currently at 120 has been doing well and he feels like his moods are doing pretty well except for today being ill. [facility] 10/23/2018 10/02/2018 07/23/2018 04/24/2018 01/12/2018  Decreased Interest 0 '2 2 2 2  ' Down, Depressed, Hopeless 0 '3 2 2 2  ' PHQ - 2 Score 0 '5 4 4 4  ' Altered sleeping - '3 3 3 2  ' Tired, decreased energy - '3 3 3 2  ' Change in appetite - '3 3 3 1  ' Feeling bad or failure about yourself  - '1 2 2 2  ' Trouble concentrating - '3 2 3 1  ' Moving slowly or fidgety/restless - '3 1 1 1  ' Suicidal thoughts - 0 0 0 0  PHQ-9 Score - '21 18 19 13  ' Difficult doing work/chores - - - - -  Some recent data might be hidden     Patient also has chronic pain including his back and his legs and is on a controlled substance contract for this.  He currently has oxycodone 5-3 25 and gets 80/month and says  that that has been controlling it.  He averages slightly over 2-1/2 tablets/day because he has the few extra which factors into about 27 mg of oxycodone daily puts him about 40 morphine equivalent daily dose.  With his anxiety patient does have some risk with his depression, he has no suicidal ideation currently  Relevant past medical, surgical, family and social history reviewed and updated as indicated. Interim medical history since our last visit reviewed. Allergies and medications reviewed and updated.  Review of Systems  Constitutional: Negative for chills and fever.  HENT: Positive for congestion, postnasal drip, rhinorrhea, sinus pressure, sneezing and sore throat. Negative for ear discharge, ear pain and voice change.   Eyes: Negative for pain, discharge,  redness and visual disturbance.  Respiratory: Positive for cough. Negative for chest tightness, shortness of breath and wheezing.   Cardiovascular: Negative for chest pain and leg swelling.  Gastrointestinal: Negative for abdominal pain, constipation and diarrhea.  Genitourinary: Negative for difficulty urinating.  Musculoskeletal: Positive for myalgias. Negative for back pain and gait problem.  Skin: Negative for rash.  Neurological: Negative for dizziness, syncope, light-headedness and headaches.  All other systems reviewed and are negative.   Per HPI unless specifically indicated above   Allergies as of 10/23/2018   No Known Allergies     Medication List       Accurate as of October 23, 2018 11:59 PM. Always use your most recent med list.        albuterol 108 (90 Base) MCG/ACT inhaler Commonly known as:  PROVENTIL HFA;VENTOLIN HFA INHALE 1 TO 2 PUFFS INTO LUNGS EVERY 4 HOURS AS NEEDED FOR WHEEZING OR SHORTNESS OF BREATH   ALPRAZolam 0.5 MG tablet Commonly known as:  XANAX Take 1 tablet (0.5 mg total) by mouth daily as needed for anxiety.   aspirin EC 81 MG tablet Take 81 mg by mouth daily.   DULoxetine 60 MG capsule Commonly known as:  CYMBALTA Take 2 capsules (120 mg total) by mouth daily.   empagliflozin 10 MG Tabs tablet Commonly known as:  JARDIANCE Take 10 mg by mouth daily.   gabapentin 300 MG capsule Commonly known as:  NEURONTIN Take 1 capsule (300 mg total) by mouth 2 (two) times daily.   gemfibrozil 600 MG tablet Commonly known as:  LOPID TAKE 1 TABLET(600 MG) BY MOUTH TWICE DAILY BEFORE A MEAL   insulin glargine 100 UNIT/ML injection Commonly known as:  LANTUS ADMINISTER 50 UNITS UNDER THE SKIN AT BEDTIME   insulin regular 100 units/mL injection Commonly known as:  NOVOLIN R,HUMULIN R Inject 0.06-0.1 mLs (6-10 Units total) into the skin 2 (two) times daily before a meal. Sliding scale   levothyroxine 50 MCG tablet Commonly known as:   SYNTHROID, LEVOTHROID TAKE 1 TABLET BY MOUTH ONCE DAILY BEFORE BREAKFAST   lisinopril 5 MG tablet Commonly known as:  PRINIVIL,ZESTRIL Take 1 tablet (5 mg total) by mouth daily.   metFORMIN 1000 MG tablet Commonly known as:  GLUCOPHAGE Take 1 tablet (1,000 mg total) by mouth 2 (two) times daily with a meal.   mirtazapine 45 MG tablet Commonly known as:  REMERON Take 1 tablet (45 mg total) by mouth at bedtime.   oseltamivir 75 MG capsule Commonly known as:  TAMIFLU Take 1 capsule (75 mg total) by mouth 2 (two) times daily.   oxyCODONE-acetaminophen 5-325 MG tablet Commonly known as:  PERCOCET/ROXICET Take 1 tablet by mouth every 8 (eight) hours as needed for moderate pain or severe pain. Do not refill  until 30 days from prescription date   oxyCODONE-acetaminophen 5-325 MG tablet Commonly known as:  PERCOCET/ROXICET Take 1 tablet by mouth every 8 (eight) hours as needed for severe pain. Do not refill until 60 days from prescription date   oxyCODONE-acetaminophen 5-325 MG tablet Commonly known as:  ROXICET Take 1 tablet by mouth every 8 (eight) hours as needed for severe pain.   pantoprazole 40 MG tablet Commonly known as:  PROTONIX Take 1 tablet (40 mg total) by mouth daily.   tamsulosin 0.4 MG Caps capsule Commonly known as:  FLOMAX Take 1 capsule (0.4 mg total) by mouth daily.          Objective:    BP 122/76   Pulse 77   Temp (!) 97.3 F (36.3 C) (Oral)   Ht 5' 11.5" (1.816 m)   Wt 191 lb 12.8 oz (87 kg)   BMI 26.38 kg/m   Wt Readings from Last 3 Encounters:  10/23/18 191 lb 12.8 oz (87 kg)  10/02/18 200 lb 6.4 oz (90.9 kg)  09/30/18 195 lb (88.5 kg)    Physical Exam Vitals signs and nursing note reviewed.  Constitutional:      General: He is not in acute distress.    Appearance: He is well-developed. He is not diaphoretic.  HENT:     Right Ear: Tympanic membrane, ear canal and external ear normal.     Left Ear: Tympanic membrane, ear canal and  external ear normal.     Nose: Mucosal edema and rhinorrhea present.     Right Sinus: Maxillary sinus tenderness present. No frontal sinus tenderness.     Left Sinus: Maxillary sinus tenderness present. No frontal sinus tenderness.     Mouth/Throat:     Pharynx: Uvula midline. Posterior oropharyngeal erythema present. No oropharyngeal exudate.     Tonsils: No tonsillar abscesses.  Eyes:     General: No scleral icterus.       Right eye: No discharge.     Conjunctiva/sclera: Conjunctivae normal.     Pupils: Pupils are equal, round, and reactive to light.  Neck:     Musculoskeletal: Neck supple.     Thyroid: No thyromegaly.  Cardiovascular:     Rate and Rhythm: Normal rate and regular rhythm.     Heart sounds: Normal heart sounds. No murmur.  Pulmonary:     Effort: Pulmonary effort is normal. No respiratory distress.     Breath sounds: Normal breath sounds. No wheezing or rales.  Musculoskeletal: Normal range of motion.  Lymphadenopathy:     Cervical: No cervical adenopathy.  Skin:    General: Skin is warm and dry.     Findings: No rash.  Neurological:     Mental Status: He is alert and oriented to person, place, and time.     Coordination: Coordination normal.  Psychiatric:        Behavior: Behavior normal.         Assessment & Plan:   Problem List Items Addressed This Visit      Cardiovascular and Mediastinum   Hypertension associated with diabetes ([place])     Digestive   GERD (gastroesophageal reflux disease)     Endocrine   Type 2 diabetes mellitus with diabetic neuropathy, unspecified (HCC) - Primary   Relevant Medications   oxyCODONE-acetaminophen (PERCOCET/ROXICET) 5-325 MG tablet   oxyCODONE-acetaminophen (PERCOCET/ROXICET) 5-325 MG tablet   oxyCODONE-acetaminophen (ROXICET) 5-325 MG tablet   Other Relevant Orders   Bayer DCA Hb A1c Waived   CMP14+EGFR  Hypothyroidism   Relevant Orders   TSH     Other   Panic disorder   Relevant Medications    ALPRAZolam (XANAX) 0.5 MG tablet    Other Visit Diagnoses    Flu-like symptoms       Relevant Medications   oseltamivir (TAMIFLU) 75 MG capsule      Will give Tamiflu to the patient because of flulike symptoms too early likely to be tested Refill patient's Roxicet, he has been stable on the dose, reviewed his refill history  Follow up plan: Return in about 3 months (around 01/21/2019), or if symptoms worsen or fail to improve, for Recheck diabetes and hypertension and thyroid.  Counseling provided for all of the vaccine components Orders Placed This Encounter  Procedures  . TSH  . Bayer DCA Hb A1c Waived  . [place], MD [facility] 10/29/2018, 9:59 PM

## 2018-11-04 ENCOUNTER — Ambulatory Visit (HOSPITAL_COMMUNITY)
Admission: RE | Admit: 2018-11-04 | Discharge: 2018-11-04 | Disposition: A | Discharge status: Home / Self Care | Payer: Medicare HMO | Source: Ambulatory Visit | Attending: Internal Medicine | Admitting: Internal Medicine

## 2018-11-04 ENCOUNTER — Encounter (HOSPITAL_COMMUNITY): Payer: Self-pay

## 2018-11-04 ENCOUNTER — Ambulatory Visit (HOSPITAL_COMMUNITY): Admission: RE | Admit: 2018-11-04 | Payer: Medicare HMO | Source: Ambulatory Visit

## 2018-11-04 ENCOUNTER — Other Ambulatory Visit: Payer: Self-pay | Admitting: Internal Medicine

## 2018-11-04 DIAGNOSIS — B182 Chronic viral hepatitis C: Secondary | ICD-10-CM

## 2018-11-10 ENCOUNTER — Other Ambulatory Visit: Payer: Self-pay | Admitting: Family Medicine

## 2018-11-10 DIAGNOSIS — R55 Syncope and collapse: Secondary | ICD-10-CM

## 2018-11-10 DIAGNOSIS — K219 Gastro-esophageal reflux disease without esophagitis: Secondary | ICD-10-CM

## 2018-11-10 DIAGNOSIS — R195 Other fecal abnormalities: Secondary | ICD-10-CM

## 2018-11-18 DIAGNOSIS — Z1211 Encounter for screening for malignant neoplasm of colon: Secondary | ICD-10-CM | POA: Diagnosis not present

## 2018-11-19 ENCOUNTER — Other Ambulatory Visit: Payer: Medicare HMO

## 2018-11-19 ENCOUNTER — Other Ambulatory Visit: Payer: Self-pay

## 2018-11-19 DIAGNOSIS — Z794 Long term (current) use of insulin: Secondary | ICD-10-CM

## 2018-11-19 DIAGNOSIS — E039 Hypothyroidism, unspecified: Secondary | ICD-10-CM

## 2018-11-19 DIAGNOSIS — Z1211 Encounter for screening for malignant neoplasm of colon: Secondary | ICD-10-CM

## 2018-11-19 DIAGNOSIS — E1140 Type 2 diabetes mellitus with diabetic neuropathy, unspecified: Secondary | ICD-10-CM | POA: Diagnosis not present

## 2018-11-19 LAB — BAYER DCA HB A1C WAIVED: HB A1C (BAYER DCA - WAIVED): 8.9 % — ABNORMAL HIGH (ref <7.0)

## 2018-11-19 NOTE — Addendum Note (Signed)
Addended by: Margorie John on: 11/19/2018 11:54 AM   Modules accepted: Orders

## 2018-11-20 ENCOUNTER — Other Ambulatory Visit: Payer: Self-pay | Admitting: *Deleted

## 2018-11-20 DIAGNOSIS — R195 Other fecal abnormalities: Secondary | ICD-10-CM

## 2018-11-20 LAB — CMP14+EGFR
ALBUMIN: 4.3 g/dL (ref 3.8–4.9)
ALT: 21 IU/L (ref 0–44)
AST: 36 IU/L (ref 0–40)
Albumin/Globulin Ratio: 1.7 (ref 1.2–2.2)
Alkaline Phosphatase: 95 IU/L (ref 39–117)
BUN/Creatinine Ratio: 20 (ref 9–20)
BUN: 21 mg/dL (ref 6–24)
Bilirubin Total: <0.2 mg/dL (ref 0.0–1.2)
CO2: 23 mmol/L (ref 20–29)
Calcium: 9.4 mg/dL (ref 8.7–10.2)
Chloride: 101 mmol/L (ref 96–106)
Creatinine, Ser: 1.07 mg/dL (ref 0.76–1.27)
GFR calc non Af Amer: 76 mL/min/{1.73_m2} (ref >59)
GFR, EST AFRICAN AMERICAN: 88 mL/min/{1.73_m2} (ref >59)
Globulin, Total: 2.5 g/dL (ref 1.5–4.5)
Glucose: 123 mg/dL — ABNORMAL HIGH (ref 65–99)
Potassium: 4.8 mmol/L (ref 3.5–5.2)
SODIUM: 140 mmol/L (ref 134–144)
TOTAL PROTEIN: 6.8 g/dL (ref 6.0–8.5)

## 2018-11-20 LAB — FECAL OCCULT BLOOD, IMMUNOCHEMICAL
FECAL OCCULT BLD: POSITIVE — AB
Fecal Occult Bld: POSITIVE — AB

## 2018-11-20 LAB — TSH: TSH: 2.58 u[IU]/mL (ref 0.450–4.500)

## 2018-11-20 NOTE — Progress Notes (Signed)
amb re 

## 2018-12-08 ENCOUNTER — Encounter: Payer: Self-pay | Admitting: Gastroenterology

## 2018-12-08 ENCOUNTER — Ambulatory Visit (INDEPENDENT_AMBULATORY_CARE_PROVIDER_SITE_OTHER): Payer: Medicare HMO | Admitting: Gastroenterology

## 2018-12-08 ENCOUNTER — Other Ambulatory Visit: Payer: Self-pay

## 2018-12-08 VITALS — BP 126/73 | HR 87 | Temp 98.1°F | Ht 72.0 in | Wt 181.4 lb

## 2018-12-08 DIAGNOSIS — K219 Gastro-esophageal reflux disease without esophagitis: Secondary | ICD-10-CM | POA: Diagnosis not present

## 2018-12-08 DIAGNOSIS — D500 Iron deficiency anemia secondary to blood loss (chronic): Secondary | ICD-10-CM

## 2018-12-08 DIAGNOSIS — R1310 Dysphagia, unspecified: Secondary | ICD-10-CM | POA: Diagnosis not present

## 2018-12-08 DIAGNOSIS — K922 Gastrointestinal hemorrhage, unspecified: Secondary | ICD-10-CM

## 2018-12-08 DIAGNOSIS — R1319 Other dysphagia: Secondary | ICD-10-CM

## 2018-12-08 DIAGNOSIS — R932 Abnormal findings on diagnostic imaging of liver and biliary tract: Secondary | ICD-10-CM | POA: Diagnosis not present

## 2018-12-08 DIAGNOSIS — K625 Hemorrhage of anus and rectum: Secondary | ICD-10-CM

## 2018-12-08 HISTORY — DX: Gastrointestinal hemorrhage, unspecified: K92.2

## 2018-12-08 HISTORY — DX: Iron deficiency anemia secondary to blood loss (chronic): D50.0

## 2018-12-08 MED ORDER — PEG 3350-KCL-NA BICARB-NACL 420 G PO SOLR: 4000.0000 mL | ORAL | 0 refills | Status: DC

## 2018-12-08 MED ORDER — PANTOPRAZOLE SODIUM 40 MG PO TBEC: 40.0000 mg | DELAYED_RELEASE_TABLET | Freq: Two times a day (BID) | ORAL | 5 refills | Status: DC

## 2018-12-08 NOTE — Assessment & Plan Note (Signed)
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  Generally considered benign etiology however discussed with patient that sometimes the presence of adenomyomatosis can make underlying malignancy difficult to see and therefore would offer him surgical consultation to determine the next i.e. cholecystectomy versus serial monitoring via ultrasound.  Patient would like to address in a few months once COVID-19 crisis has improved.

## 2018-12-08 NOTE — Progress Notes (Signed)
Primary Care Physician:  Dettinger, Elige Radon, MD  Primary Gastroenterologist:  Roetta Sessions, MD   Chief Complaint  Patient presents with  . Blood In Stools    present off and on x 2 months  . Gastroesophageal Reflux    HPI:  Jacob Bautista is a 58 y.o. male here at the request of Dr. Louanne Skye for further evaluation of Hemoccult positive stool.  Patient was hospitalized back in January with syncope, nausea and vomiting.  At that time was having darker stools but no bright red blood.  Some upper abdominal pain.  History of Goody's powders use for arthritis. On day of admission his hemoglobin was 12.7, hematocrit 41.  2 days later his hemoglobin was 12.1, hematocrit 36.  On February 27 he had an ultrasound with elastography by Dr. 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  He has chronic hepatitis C, genotype 1a, has not undergone treatment yet.  HCVRNA positive back in February 2019.  Risk factors including blood transfusion in the 70s, tattoos received while in prison.  Patient states he continues to have intermittent GI bleeding.  May go 10 days at a time with no symptoms but then have bleeding for 2 to 3 days in a row.  Seen clots, dark blood.  While he was hospitalized he states he was not having any active bleeding at the time.  He was treated for dehydration.  At times he will have intermittent bright red blood per rectum which he feels is related to hemorrhoids.  This is been more of a chronic issue.   BM regular. Some black stools. Had been getting heartburn, new, but med working. Sometimes feels like food sticking in throat. Some lower abdominal pain.  Admits that he continues to use Goody powders regularly.  Documented 20 pound weight loss, patient states he has been living on his own for couple months not eating as well as when he was with his daughter.  Eating only 1-2 times per day.  At times his sugars  are greater than 600.  Remote colonoscopy while he was in his 77s for rectal bleeding, possibly due to hemorrhoid.     Current Outpatient Medications  Medication Sig Dispense Refill  . albuterol (PROVENTIL HFA;VENTOLIN HFA) 108 (90 Base) MCG/ACT inhaler INHALE 1 TO 2 PUFFS INTO LUNGS EVERY 4 HOURS AS NEEDED FOR WHEEZING OR SHORTNESS OF BREATH (Patient taking differently: Inhale 1-2 puffs into the lungs every 4 (four) hours as needed for wheezing or shortness of breath. ) 18 g 5  . ALPRAZolam (XANAX) 0.5 MG tablet Take 1 tablet (0.5 mg total) by mouth daily as needed for anxiety. 20 tablet 2  . aspirin EC 81 MG tablet Take 81 mg by mouth daily.    . DULoxetine (CYMBALTA) 60 MG capsule Take 2 capsules (120 mg total) by mouth daily. 180 capsule 3  . empagliflozin (JARDIANCE) 10 MG TABS tablet Take 10 mg by mouth daily. 90 tablet 3  . gabapentin (NEURONTIN) 300 MG capsule Take 1 capsule (300 mg total) by mouth 2 (two) times daily. 180 capsule 3  . gemfibrozil (LOPID) 600 MG tablet TAKE 1 TABLET(600 MG) BY MOUTH TWICE DAILY BEFORE A MEAL (Patient taking differently: Take 600 mg by mouth 2 (two) times daily before a meal. ) 180 tablet 1  . insulin glargine (LANTUS) 100 UNIT/ML injection ADMINISTER 50 UNITS UNDER THE SKIN AT BEDTIME (Patient taking differently: Inject 50 Units into the  skin at bedtime. ) 10 mL 3  . insulin regular (NOVOLIN R,HUMULIN R) 100 units/mL injection Inject 0.06-0.1 mLs (6-10 Units total) into the skin 2 (two) times daily before a meal. Sliding scale (Patient taking differently: Inject 6-10 Units into the skin 2 (two) times daily before a meal. Sliding scale For levels over 150) 10 mL 11  . levothyroxine (SYNTHROID, LEVOTHROID) 50 MCG tablet TAKE 1 TABLET BY MOUTH ONCE DAILY BEFORE BREAKFAST (Patient taking differently: Take 50 mcg by mouth daily before breakfast. ) 90 tablet 3  . lisinopril (PRINIVIL,ZESTRIL) 5 MG tablet Take 1 tablet (5 mg total) by mouth daily. 90 tablet 3  .  metFORMIN (GLUCOPHAGE) 1000 MG tablet Take 1 tablet (1,000 mg total) by mouth 2 (two) times daily with a meal. 180 tablet 3  . mirtazapine (REMERON) 45 MG tablet Take 1 tablet (45 mg total) by mouth at bedtime. 90 tablet 1  . oxyCODONE-acetaminophen (PERCOCET/ROXICET) 5-325 MG tablet Take 1 tablet by mouth every 8 (eight) hours as needed for moderate pain or severe pain. Do not refill until 30 days from prescription date 80 tablet 0  . oxyCODONE-acetaminophen (PERCOCET/ROXICET) 5-325 MG tablet Take 1 tablet by mouth every 8 (eight) hours as needed for severe pain. Do not refill until 60 days from prescription date 80 tablet 0  . pantoprazole (PROTONIX) 40 MG tablet Take 1 tablet (40 mg total) by mouth daily. 30 tablet 3  . tamsulosin (FLOMAX) 0.4 MG CAPS capsule Take 1 capsule (0.4 mg total) by mouth daily. (Patient taking differently: Take 0.4 mg by mouth every morning. ) 30 capsule 3   No current facility-administered medications for this visit.     Allergies as of 12/08/2018  . (No Known Allergies)    Past Medical History:  Diagnosis Date  . Bipolar disorder (HCC)   . BPH (benign prostatic hyperplasia)   . Chronic back pain   . Chronic hepatitis C (HCC)   . Depression    PTSD  . Diabetes mellitus without complication (HCC)   . Hypertension   . Thyroid disease     Past Surgical History:  Procedure Laterality Date  . CATARACT EXTRACTION W/ INTRAOCULAR LENS IMPLANT Left   . FACIAL RECONSTRUCTION SURGERY    . HERNIA REPAIR     x 2  . left hand surg    . SCROTAL SURGERY      Family History  Problem Relation Age of Onset  . Heart disease Father   . Diabetes Father   . Hypertension Father   . Stroke Father   . Stroke Brother     Social History   Socioeconomic History  . Marital status: Single    Spouse name: Not on file  . Number of children: Not on file  . Years of education: Not on file  . Highest education level: Not on file  Occupational History  . Not on file   Social Needs  . Financial resource strain: Not on file  . Food insecurity:    Worry: Not on file    Inability: Not on file  . Transportation needs:    Medical: Not on file    Non-medical: Not on file  Tobacco Use  . Smoking status: Current Every Day Smoker    Packs/day: 1.00    Types: Cigarettes  . Smokeless tobacco: Current User    Types: Chew  Substance and Sexual Activity  . Alcohol use: Yes    Comment: social  . Drug use: Yes  Frequency: 3.0 times per week    Types: Marijuana    Comment: today  . Sexual activity: Not on file  Lifestyle  . Physical activity:    Days per week: Not on file    Minutes per session: Not on file  . Stress: Not on file  Relationships  . Social connections:    Talks on phone: Not on file    Gets together: Not on file    Attends religious service: Not on file    Active member of club or organization: Not on file    Attends meetings of clubs or organizations: Not on file    Relationship status: Not on file  . Intimate partner violence:    Fear of current or ex partner: Not on file    Emotionally abused: Not on file    Physically abused: Not on file    Forced sexual activity: Not on file  Other Topics Concern  . Not on file  Social History Narrative  . Not on file      ROS:  General: Negative for anorexia,fever, chills, fatigue, weakness.  See HPI Eyes: Negative for vision changes.  ENT: Negative for hoarseness, difficulty swallowing , nasal congestion. CV: Negative for chest pain, angina, palpitations, dyspnea on exertion, peripheral edema.  Respiratory: Negative for dyspnea at rest, dyspnea on exertion, cough, sputum, wheezing.  GI: See history of present illness. GU:  Negative for dysuria, hematuria, urinary incontinence, urinary frequency, nocturnal urination.  MS: Multiple joint pains, back pain Derm: Negative for rash or itching.  Neuro: Negative for weakness, abnormal sensation, seizure, frequent headaches, memory loss,  confusion.  Psych: Negative for anxiety, depression, suicidal ideation, hallucinations.  Endo: Negative for unusual weight change.  Heme: Negative for bruising or bleeding. Allergy: Negative for rash or hives.    Physical Examination:  BP 126/73   Pulse 87   Temp 98.1 F (36.7 C) (Oral)   Ht 6' (1.829 m)   Wt 181 lb 6.4 oz (82.3 kg)   BMI 24.60 kg/m    General: Well-nourished, well-developed in no acute distress.  Head: Normocephalic, atraumatic.   Eyes: Conjunctiva pink, no icterus. Mouth: Oropharyngeal mucosa moist and pink , no lesions erythema or exudate. Neck: Supple without thyromegaly, masses, or lymphadenopathy.  Lungs: Clear to auscultation bilaterally.  Heart: Regular rate and rhythm, no murmurs rubs or gallops.  Abdomen: Bowel sounds are normal, nontender, nondistended, no hepatosplenomegaly or masses, no abdominal bruits or    hernia , no rebound or guarding.   Rectal: Not performed Extremities: No lower extremity edema. No clubbing or deformities.  Neuro: Alert and oriented x 4 , grossly normal neurologically.  Skin: Warm and dry, no rash or jaundice.   Psych: Alert and cooperative, normal mood and affect.  Labs: Lab Results  Component Value Date   CREATININE 1.07 11/19/2018   BUN 21 11/19/2018   NA 140 11/19/2018   K 4.8 11/19/2018   CL 101 11/19/2018   CO2 23 11/19/2018   Lab Results  Component Value Date   ALT 21 11/19/2018   AST 36 11/19/2018   ALKPHOS 95 11/19/2018   BILITOT <0.2 11/19/2018   Lab Results  Component Value Date   WBC 5.4 10/02/2018   HGB 12.1 (L) 10/02/2018   HCT 36.0 (L) 10/02/2018   MCV 87 10/02/2018   PLT 200 10/02/2018   Lab Results  Component Value Date   TSH 2.580 11/19/2018     Imaging Studies: No results found.

## 2018-12-08 NOTE — Assessment & Plan Note (Addendum)
Pleasant 58 year old gentleman presenting with chronic intermittent GI bleeding described as dark blood per rectum with clots, melena and sometimes fresh blood per rectum.  Patient takes Marlin Canary powders regularly.  He has been advised to come back to tells me he really has not been able to because of significant chronic pain.  He is now on pantoprazole 40 mg daily.  Denies any further typical reflux.  He has some persisting solid food dysphagia.  Discussed that he may have an upper GI bleed but cannot rule out proximal colonic bleed or small bowel bleeding.  Given fresh blood per rectum as well would offer him a colonoscopy with EGD/ED in the near future.  Plan for deep sedation.  I have discussed the risks, alternatives, benefits with regards to but not limited to the risk of reaction to medication, bleeding, infection, perforation and the patient is agreeable to proceed. Written consent to be obtained.  Patient tells me that he would like to postpone at least a couple months due to COVID-19 crisis.  We have scheduled him for June.  In the meantime we will update his labs to determine stability of his hemoglobin.  He has been given ED precautions as well.  Encouraged him to discontinue aspirin powder use.  Increase his pantoprazole to 40 mg twice daily before meals.  He is aware that if his labs indicate significant anemia, we will need to pursue more urgent procedures.

## 2018-12-08 NOTE — Patient Instructions (Addendum)
Please have your labs done this week.  Please try to stop Aspirin powders.  Continue pantoprazole, we will increase to twice daily before breakfast and evening meal. New RX sent to pharmacy.  Plan for colonoscopy and upper endoscopy around June 2020 (as you have requested waiting for now if possible due to COVID-19). HOWEVER, IF YOU DEMONSTRATE SIGNIFICANT ANEMIA ON LABS OR INCREASED BLEEDING, BLACK STOOLS, WEAKNESS WE WILL NEED TO DO PROCEDURES SOONER.

## 2018-12-08 NOTE — Patient Instructions (Signed)
PA for EGD submitted via [employer] website. Case approved. PA# [id number], 02/15/19-05/16/19.

## 2018-12-09 ENCOUNTER — Telehealth: Payer: Self-pay

## 2018-12-09 NOTE — Telephone Encounter (Signed)
Called and informed pt of pre-op appt 02/09/19 at 11:00am. Letter mailed.

## 2018-12-09 NOTE — Progress Notes (Signed)
cc'ed to pcp °

## 2018-12-10 ENCOUNTER — Other Ambulatory Visit: Payer: Self-pay | Admitting: Family Medicine

## 2018-12-10 DIAGNOSIS — R351 Nocturia: Secondary | ICD-10-CM

## 2018-12-21 ENCOUNTER — Other Ambulatory Visit: Payer: Self-pay | Admitting: Family Medicine

## 2018-12-21 DIAGNOSIS — Z794 Long term (current) use of insulin: Principal | ICD-10-CM

## 2018-12-21 DIAGNOSIS — E1140 Type 2 diabetes mellitus with diabetic neuropathy, unspecified: Secondary | ICD-10-CM

## 2019-01-21 ENCOUNTER — Encounter: Payer: Self-pay | Admitting: Family Medicine

## 2019-01-21 ENCOUNTER — Ambulatory Visit (INDEPENDENT_AMBULATORY_CARE_PROVIDER_SITE_OTHER): Payer: Medicare Other | Admitting: Family Medicine

## 2019-01-21 ENCOUNTER — Other Ambulatory Visit: Payer: Self-pay

## 2019-01-21 DIAGNOSIS — E1140 Type 2 diabetes mellitus with diabetic neuropathy, unspecified: Secondary | ICD-10-CM

## 2019-01-21 DIAGNOSIS — F410 Panic disorder [episodic paroxysmal anxiety] without agoraphobia: Secondary | ICD-10-CM | POA: Diagnosis not present

## 2019-01-21 DIAGNOSIS — Z794 Long term (current) use of insulin: Secondary | ICD-10-CM | POA: Diagnosis not present

## 2019-01-21 DIAGNOSIS — J441 Chronic obstructive pulmonary disease with (acute) exacerbation: Secondary | ICD-10-CM | POA: Diagnosis not present

## 2019-01-21 MED ORDER — ALPRAZOLAM 0.5 MG PO TABS: 0.5000 mg | ORAL_TABLET | Freq: Every day | ORAL | 2 refills | Status: DC | PRN

## 2019-01-21 MED ORDER — AMOXICILLIN-POT CLAVULANATE 875-125 MG PO TABS: 1.0000 | ORAL_TABLET | Freq: Two times a day (BID) | ORAL | 0 refills | Status: DC

## 2019-01-21 MED ORDER — GUAIFENESIN 100 MG/5ML PO SOLN: 5.0000 mL | ORAL | 0 refills | Status: DC | PRN

## 2019-01-21 MED ORDER — PREDNISONE 20 MG PO TABS: ORAL_TABLET | ORAL | 0 refills | Status: DC

## 2019-01-21 MED ORDER — ALBUTEROL SULFATE HFA 108 (90 BASE) MCG/ACT IN AERS: 1.0000 | INHALATION_SPRAY | RESPIRATORY_TRACT | 5 refills | Status: DC | PRN

## 2019-01-21 MED ORDER — OXYCODONE-ACETAMINOPHEN 5-325 MG PO TABS: 1.0000 | ORAL_TABLET | Freq: Three times a day (TID) | ORAL | 0 refills | Status: DC | PRN

## 2019-01-21 NOTE — Progress Notes (Signed)
Virtual Visit via telephone Note  I connected with Jacob Bautista on 01/21/19 at 0956 by telephone and verified that I am speaking with the correct person using two identifiers. Jacob Bautista is currently located at home and no other people are currently with her during visit. The provider, Elige RadonJoshua A Dettinger, MD is located in their office at time of visit.  Call ended at [phone]  I discussed the limitations, risks, security and privacy concerns of performing an evaluation and management service by telephone and the availability of in person appointments. I also discussed with the patient that there may be a patient responsible charge related to this service. The patient expressed understanding and agreed to proceed.   History and Present Illness: Pain assessment: Cause of pain- back and knees arthritis Pain location- knees and back Pain on scale of 1-10- 6 Frequency- daily What increases pain-movement and working, working with tractor What makes pain Better-medication and rest Effects on ADL - can't do lifting or prolonged working Any change in general medical condition-none  Current opioids rx- percocet 5-325 mg q8h prn # meds rx- 90 Effectiveness of current meds-ok but wants increase to 90 from 80 Adverse reactions form pain meds-none Morphine equivalent- 22.5  Pill count performed-No Last drug screen - 11//14/2020 ( high risk q5214m, moderate risk q4651m, low risk yearly ) Urine drug screen today- No Was the [facility] reviewed- yes  If yes were their any concerning findings? - none  No flowsheet data found.   Pain contract signed on: 07/2018  Type 2 diabetes mellitus Patient comes in today for recheck of his diabetes. Patient has been currently taking jardiance and novolin and lantus. Patient is currently on an ACE inhibitor/ARB. Patient has not seen an ophthalmologist this year. Patient denies any issues with their feet.  Patient has stable neuropathy  COPD Patient  is coming in for COPD recheck today.  He has not been doing well over the past couple weeks and having a lot of congestion and wheezing and feeling short of breath.  He denies any fevers or chills.  He says his COPD and wheezing are really acting up.  He says he has been using some over-the-counter Tylenol Sinus and cold medication and has helped some but he still feels really congested and having the breathing issues.  He denies any sick contacts and has been mostly staying at home.  No diagnosis found.  Outpatient Encounter Medications as of 01/21/2019  Medication Sig  . albuterol (PROVENTIL HFA;VENTOLIN HFA) 108 (90 Base) MCG/ACT inhaler INHALE 1 TO 2 PUFFS INTO LUNGS EVERY 4 HOURS AS NEEDED FOR WHEEZING OR SHORTNESS OF BREATH (Patient taking differently: Inhale 1-2 puffs into the lungs every 4 (four) hours as needed for wheezing or shortness of breath. )  . ALPRAZolam (XANAX) 0.5 MG tablet Take 1 tablet (0.5 mg total) by mouth daily as needed for anxiety.  [name]. aspirin EC 81 MG tablet Take 81 mg by mouth daily.  . DULoxetine (CYMBALTA) 60 MG capsule Take 2 capsules (120 mg total) by mouth daily.  . empagliflozin (JARDIANCE) 10 MG TABS tablet Take 10 mg by mouth daily.  [name]. gabapentin (NEURONTIN) 300 MG capsule Take 1 capsule (300 mg total) by mouth 2 (two) times daily.  [name]. gemfibrozil (LOPID) 600 MG tablet TAKE 1 TABLET(600 MG) BY MOUTH TWICE DAILY BEFORE A MEAL (Patient taking differently: Take 600 mg by mouth 2 (two) times daily before a meal. )  . insulin glargine (LANTUS) 100 UNIT/ML injection  ADMINISTER 50 UNITS UNDER THE SKIN AT BEDTIME  . insulin regular (NOVOLIN R,HUMULIN R) 100 units/mL injection Inject 0.06-0.1 mLs (6-10 Units total) into the skin 2 (two) times daily before a meal. Sliding scale (Patient taking differently: Inject 6-10 Units into the skin 2 (two) times daily before a meal. Sliding scale For levels over 150)  . levothyroxine (SYNTHROID, LEVOTHROID) 50 MCG tablet TAKE 1 TABLET BY  MOUTH ONCE DAILY BEFORE BREAKFAST (Patient taking differently: Take 50 mcg by mouth daily before breakfast. )  . lisinopril (PRINIVIL,ZESTRIL) 5 MG tablet Take 1 tablet (5 mg total) by mouth daily.  . metFORMIN (GLUCOPHAGE) 1000 MG tablet Take 1 tablet (1,000 mg total) by mouth 2 (two) times daily with a meal.  . mirtazapine (REMERON) 45 MG tablet Take 1 tablet (45 mg total) by mouth at bedtime.  [name] oxyCODONE-acetaminophen (PERCOCET/ROXICET) 5-325 MG tablet Take 1 tablet by mouth every 8 (eight) hours as needed for moderate pain or severe pain. Do not refill until 30 days from prescription date  . oxyCODONE-acetaminophen (PERCOCET/ROXICET) 5-325 MG tablet Take 1 tablet by mouth every 8 (eight) hours as needed for severe pain. Do not refill until 60 days from prescription date  . pantoprazole (PROTONIX) 40 MG tablet Take 1 tablet (40 mg total) by mouth 2 (two) times daily before a meal.  . polyethylene glycol-electrolytes (TRILYTE) 420 g solution Take 4,000 mLs by mouth as directed.  . tamsulosin (FLOMAX) 0.4 MG CAPS capsule TAKE 1 CAPSULE(0.4 MG) BY MOUTH DAILY   No facility-administered encounter medications on file as of 01/21/2019.     Review of Systems  Constitutional: Negative for chills and fever.  HENT: Positive for congestion, postnasal drip, rhinorrhea, sinus pressure, sneezing and sore throat. Negative for ear discharge, ear pain and voice change.   Eyes: Negative for pain, discharge, redness and visual disturbance.  Respiratory: Positive for cough, shortness of breath and wheezing.   Cardiovascular: Negative for chest pain and leg swelling.  Musculoskeletal: Negative for gait problem.  Skin: Negative for rash.  Neurological: Negative for dizziness, weakness and light-headedness.  All other systems reviewed and are negative.   Observations/Objective: Patient sounds congested but no in acute distress  Assessment and Plan: Problem List Items Addressed This Visit      Respiratory    COPD exacerbation (HCC)   Relevant Medications   albuterol (VENTOLIN HFA) 108 (90 Base) MCG/ACT inhaler   predniSONE (DELTASONE) 20 MG tablet   guaiFENesin (ROBITUSSIN) 100 MG/5ML SOLN   amoxicillin-clavulanate (AUGMENTIN) 875-125 MG tablet     Nervous and Auditory   Type 2 diabetes mellitus with diabetic neuropathy, unspecified (HCC) - Primary   Relevant Medications   oxyCODONE-acetaminophen (PERCOCET/ROXICET) 5-325 MG tablet   oxyCODONE-acetaminophen (PERCOCET/ROXICET) 5-325 MG tablet   oxyCODONE-acetaminophen (PERCOCET/ROXICET) 5-325 MG tablet     Other   Panic disorder   Relevant Medications   ALPRAZolam (XANAX) 0.5 MG tablet       Follow Up Instructions:  Patient is recommended to quarantine self and follow up as needed, given trigger signs to watch for to go to ER  Return in 3 months, diabetes and pain   I discussed the assessment and treatment plan with the patient. The patient was provided an opportunity to ask questions and all were answered. The patient agreed with the plan and demonstrated an understanding of the instructions.   The patient was advised to call back or seek an in-person evaluation if the symptoms worsen or if the condition fails to improve as  anticipated.  The above assessment and management plan was discussed with the patient. The patient verbalized understanding of and has agreed to the management plan. Patient is aware to call the clinic if symptoms persist or worsen. Patient is aware when to return to the clinic for a follow-up visit. Patient educated on when it is appropriate to go to the emergency department.    I provided 22 minutes of non-face-to-face time during this encounter.    Nils Pyle, MD

## 2019-02-02 NOTE — Patient Instructions (Signed)
Jacob Bautista  02/02/2019     @PREFPERIOPPHARMACY @   Your procedure is scheduled on  02/15/2019  Report to [facility] at  830   A.M.  Call this number if you have problems the morning of surgery:  [phone]   Remember:  Follow the diet and prep instructions given to you by Dr Luvenia Starchourk's office.                       Take these medicines the morning of surgery with A SIP OF WATER  Xanax(if needed), cymbalta, gabapentin, levothyroxine, oxycodone(if needed), protonix, flomax. Use your inhaler before you come. DO NOT take any medications for diabetes the morning of your procedure. Take 1/2 of your usual insulin the night before your procedure.    Do not wear jewelry, make-up or nail polish.  Do not wear lotions, powders, or perfumes, or deodorant.  Do not shave 48 hours prior to surgery.  Men may shave face and neck.  Do not bring valuables to the hospital.  [facility] is not responsible for any belongings or valuables.  Contacts, dentures or bridgework may not be worn into surgery.  Leave your suitcase in the car.  After surgery it may be brought to your room.  For patients admitted to the hospital, discharge time will be determined by your treatment team.  Patients discharged the day of surgery will not be allowed to drive home.   Name and phone number of your driver:   family Special instructions:  SEE ABOVE  Please read over the following fact sheets that you were given. Anesthesia Post-op Instructions and Care and Recovery After Surgery       Upper Endoscopy, Adult, Care After This sheet gives you information about how to care for yourself after your procedure. Your health care provider may also give you more specific instructions. If you have problems or questions, contact your health care provider. What can I expect after the procedure? After the procedure, it is common to have:  A sore throat.  Mild stomach pain or discomfort.  Bloating.   Nausea. Follow these instructions at home:   Follow instructions from your health care provider about what to eat or drink after your procedure.  Return to your normal activities as told by your health care provider. Ask your health care provider what activities are safe for you.  Take over-the-counter and prescription medicines only as told by your health care provider.  Do not drive for 24 hours if you were given a sedative during your procedure.  Keep all follow-up visits as told by your health care provider. This is important. Contact a health care provider if you have:  A sore throat that lasts longer than one day.  Trouble swallowing. Get help right away if:  You vomit blood or your vomit looks like coffee grounds.  You have: ? A fever. ? Bloody, black, or tarry stools. ? A severe sore throat or you cannot swallow. ? Difficulty breathing. ? Severe pain in your chest or abdomen. Summary  After the procedure, it is common to have a sore throat, mild stomach discomfort, bloating, and nausea.  Do not drive for 24 hours if you were given a sedative during the procedure.  Follow instructions from your health care provider about what to eat or drink after your procedure.  Return to your normal activities as told by your health care provider. This  information is not intended to replace advice given to you by your health care provider. Make sure you discuss any questions you have with your health care provider. Document Released: 02/25/2012 Document Revised: 01/26/2018 Document Reviewed: 01/26/2018 Elsevier Interactive Patient Education  2019 [employer]  Esophageal Dilatation Esophageal dilatation, also called esophageal dilation, is a procedure to widen or open (dilate) a blocked or narrowed part of the esophagus. The esophagus is the part of the body that moves food and liquid from the mouth to the stomach. You may need this procedure if:  You have a buildup of scar  tissue in your esophagus that makes it difficult, painful, or impossible to swallow. This can be caused by gastroesophageal reflux disease (GERD).  You have cancer of the esophagus.  There is a problem with how food moves through your esophagus. In some cases, you may need this procedure repeated at a later time to dilate the esophagus gradually. Tell a health care provider about:  Any allergies you have.  All medicines you are taking, including vitamins, herbs, eye drops, creams, and over-the-counter medicines.  Any problems you or family members have had with anesthetic medicines.  Any blood disorders you have.  Any surgeries you have had.  Any medical conditions you have.  Any antibiotic medicines you are required to take before dental procedures.  Whether you are pregnant or may be pregnant. What are the risks? Generally, this is a safe procedure. However, problems may occur, including:  Bleeding due to a tear in the lining of the esophagus.  A hole (perforation) in the esophagus. What happens before the procedure?  Follow instructions from your health care provider about eating or drinking restrictions.  Ask your health care provider about changing or stopping your regular medicines. This is especially important if you are taking diabetes medicines or blood thinners.  Plan to have someone take you home from the hospital or clinic.  Plan to have a responsible adult care for you for at least 24 hours after you leave the hospital or clinic. This is important. What happens during the procedure?  You may be given a medicine to help you relax (sedative).  A numbing medicine may be sprayed into the back of your throat, or you may gargle the medicine.  Your health care provider may perform the dilatation using various surgical instruments, such as: ? Simple dilators. This instrument is carefully placed in the esophagus to stretch it. ? Guided wire bougies. This involves  using an endoscope to insert a wire into the esophagus. A dilator is passed over this wire to enlarge the esophagus. Then the wire is removed. ? Balloon dilators. An endoscope with a small balloon at the end is inserted into the esophagus. The balloon is inflated to stretch the esophagus and open it up. The procedure may vary among health care providers and hospitals. What happens after the procedure?  Your blood pressure, heart rate, breathing rate, and blood oxygen level will be monitored until the medicines you were given have worn off.  Your throat may feel slightly sore and numb. This will improve slowly over time.  You will not be allowed to eat or drink until your throat is no longer numb.  When you are able to drink, urinate, and sit on the edge of the bed without nausea or dizziness, you may be able to return home. Follow these instructions at home:  Take over-the-counter and prescription medicines only as told by your health care provider.  Do not drive for 24 hours if you were given a sedative during your procedure.  You should have a responsible adult with you for 24 hours after the procedure.  Follow instructions from your health care provider about any eating or drinking restrictions.  Do not use any products that contain nicotine or tobacco, such as cigarettes and e-cigarettes. If you need help quitting, ask your health care provider.  Keep all follow-up visits as told by your health care provider. This is important. Get help right away if you:  Have a fever.  Have chest pain.  Have pain that is not relieved by medication.  Have trouble breathing.  Have trouble swallowing.  Vomit blood. Summary  Esophageal dilatation, also called esophageal dilation, is a procedure to widen or open (dilate) a blocked or narrowed part of the esophagus.  Plan to have someone take you home from the hospital or clinic.  For this procedure, a numbing medicine may be sprayed into  the back of your throat, or you may gargle the medicine.  Do not drive for 24 hours if you were given a sedative during your procedure. This information is not intended to replace advice given to you by your health care provider. Make sure you discuss any questions you have with your health care provider. Document Released: 10/17/2005 Document Revised: 07/01/2017 Document Reviewed: 07/01/2017 Elsevier Interactive Patient Education  2019 [employer]  Colonoscopy, Adult, Care After This sheet gives you information about how to care for yourself after your procedure. Your health care provider may also give you more specific instructions. If you have problems or questions, contact your health care provider. What can I expect after the procedure? After the procedure, it is common to have:  A small amount of blood in your stool for 24 hours after the procedure.  Some gas.  Mild abdominal cramping or bloating. Follow these instructions at home: General instructions  For the first 24 hours after the procedure: ? Do not drive or use machinery. ? Do not sign important documents. ? Do not drink alcohol. ? Do your regular daily activities at a slower pace than normal. ? Eat soft, easy-to-digest foods.  Take over-the-counter or prescription medicines only as told by your health care provider. Relieving cramping and bloating   Try walking around when you have cramps or feel bloated.  Apply heat to your abdomen as told by your health care provider. Use a heat source that your health care provider recommends, such as a moist heat pack or a heating pad. ? Place a towel between your skin and the heat source. ? Leave the heat on for 20-30 minutes. ? Remove the heat if your skin turns bright red. This is especially important if you are unable to feel pain, heat, or cold. You may have a greater risk of getting burned. Eating and drinking   Drink enough fluid to keep your urine pale yellow.   Resume your normal diet as instructed by your health care provider. Avoid heavy or fried foods that are hard to digest.  Avoid drinking alcohol for as long as instructed by your health care provider. Contact a health care provider if:  You have blood in your stool 2-3 days after the procedure. Get help right away if:  You have more than a small spotting of blood in your stool.  You pass large blood clots in your stool.  Your abdomen is swollen.  You have nausea or vomiting.  You have a fever.  You have increasing abdominal pain that is not relieved with medicine. Summary  After the procedure, it is common to have a small amount of blood in your stool. You may also have mild abdominal cramping and bloating.  For the first 24 hours after the procedure, do not drive or use machinery, sign important documents, or drink alcohol.  Contact your health care provider if you have a lot of blood in your stool, nausea or vomiting, a fever, or increased abdominal pain. This information is not intended to replace advice given to you by your health care provider. Make sure you discuss any questions you have with your health care provider. Document Released: 04/09/2004 Document Revised: 06/18/2017 Document Reviewed: 11/07/2015 Elsevier Interactive Patient Education  2019 [employer] Monitored Anesthesia Care, Care After These instructions provide you with information about caring for yourself after your procedure. Your health care provider may also give you more specific instructions. Your treatment has been planned according to current medical practices, but problems sometimes occur. Call your health care provider if you have any problems or questions after your procedure. What can I expect after the procedure? After your procedure, you may:  Feel sleepy for several hours.  Feel clumsy and have poor balance for several hours.  Feel forgetful about what happened after the procedure.  Have  poor judgment for several hours.  Feel nauseous or vomit.  Have a sore throat if you had a breathing tube during the procedure. Follow these instructions at home: For at least 24 hours after the procedure:      Have a responsible adult stay with you. It is important to have someone help care for you until you are awake and alert.  Rest as needed.  Do not: ? Participate in activities in which you could fall or become injured. ? Drive. ? Use heavy machinery. ? Drink alcohol. ? Take sleeping pills or medicines that cause drowsiness. ? Make important decisions or sign legal documents. ? Take care of children on your own. Eating and drinking  Follow the diet that is recommended by your health care provider.  If you vomit, drink water, juice, or soup when you can drink without vomiting.  Make sure you have little or no nausea before eating solid foods. General instructions  Take over-the-counter and prescription medicines only as told by your health care provider.  If you have sleep apnea, surgery and certain medicines can increase your risk for breathing problems. Follow instructions from your health care provider about wearing your sleep device: ? Anytime you are sleeping, including during daytime naps. ? While taking prescription pain medicines, sleeping medicines, or medicines that make you drowsy.  If you smoke, do not smoke without supervision.  Keep all follow-up visits as told by your health care provider. This is important. Contact a health care provider if:  You keep feeling nauseous or you keep vomiting.  You feel light-headed.  You develop a rash.  You have a fever. Get help right away if:  You have trouble breathing. Summary  For several hours after your procedure, you may feel sleepy and have poor judgment.  Have a responsible adult stay with you for at least 24 hours or until you are awake and alert. This information is not intended to replace advice  given to you by your health care provider. Make sure you discuss any questions you have with your health care provider. Document Released: 12/17/2015 Document Revised: 04/11/2017 Document Reviewed: 12/17/2015 Elsevier Interactive Patient Education  2019 [place].

## 2019-02-03 ENCOUNTER — Telehealth: Payer: Self-pay | Admitting: Internal Medicine

## 2019-02-03 NOTE — Telephone Encounter (Addendum)
Called pt, he wants to cancel TCS/EGD/DIL w/Propofol w/RMR at this time. He is afraid to go to hospital d/t COVID-19. States he has some breathing problems. He is ok with waiting till possibly September to have procedure. Will call him later to reschedule procedure. LMOVM for endo scheduler.

## 2019-02-03 NOTE — Telephone Encounter (Signed)
Pt called to say he isn't comfortable going into the hospital and wants to reschedule his procedure on 6/8 with RMR. [phone]

## 2019-02-04 NOTE — Telephone Encounter (Signed)
, °

## 2019-02-08 ENCOUNTER — Other Ambulatory Visit: Payer: Self-pay

## 2019-02-08 ENCOUNTER — Telehealth: Payer: Self-pay

## 2019-02-08 DIAGNOSIS — K625 Hemorrhage of anus and rectum: Secondary | ICD-10-CM

## 2019-02-08 DIAGNOSIS — R1310 Dysphagia, unspecified: Secondary | ICD-10-CM

## 2019-02-08 DIAGNOSIS — R1319 Other dysphagia: Secondary | ICD-10-CM

## 2019-02-08 NOTE — Telephone Encounter (Signed)
Called pt, TCS/EGD/DIL w/Propofol w/RMR rescheduled to 04/22/19 at 8:15am. Pt already has prep. Orders entered.

## 2019-02-09 ENCOUNTER — Inpatient Hospital Stay (HOSPITAL_COMMUNITY)
Admission: RE | Admit: 2019-02-09 | Discharge: 2019-02-09 | Disposition: A | Discharge status: Home / Self Care | Payer: Medicare HMO | Source: Ambulatory Visit

## 2019-02-09 ENCOUNTER — Encounter (HOSPITAL_COMMUNITY): Payer: Self-pay

## 2019-02-09 NOTE — Telephone Encounter (Signed)
Pre-op appt 04/19/19 at 11:00am. Letter mailed with procedure instructions.

## 2019-02-15 ENCOUNTER — Telehealth: Payer: Self-pay

## 2019-02-15 ENCOUNTER — Encounter (HOSPITAL_COMMUNITY): Payer: Self-pay

## 2019-02-15 ENCOUNTER — Ambulatory Visit (HOSPITAL_COMMUNITY): Admit: 2019-02-15 | Payer: Medicare HMO | Admitting: Internal Medicine

## 2019-02-15 SURGERY — COLONOSCOPY WITH PROPOFOL
Anesthesia: Monitor Anesthesia Care

## 2019-02-15 MED ORDER — INSULIN REGULAR HUMAN 100 UNIT/ML IJ SOLN: INTRAMUSCULAR | 11 refills | Status: DC

## 2019-02-15 NOTE — Telephone Encounter (Signed)
FYI, patient's insurance, [employer], has requested that you switch patient's insulin.  His Novolin R is non-formulary and they will no longer pay for this drug.  The formulary insulin drug is Humulin R.

## 2019-02-15 NOTE — Telephone Encounter (Signed)
Yes go ahead and send a new prescription for Humulin R, keep at the same dosing.

## 2019-02-15 NOTE — Telephone Encounter (Signed)
Humulin R sent to pharmacy

## 2019-03-02 ENCOUNTER — Other Ambulatory Visit: Payer: Self-pay | Admitting: Family Medicine

## 2019-04-07 ENCOUNTER — Other Ambulatory Visit: Payer: Self-pay | Admitting: Family Medicine

## 2019-04-07 DIAGNOSIS — R351 Nocturia: Secondary | ICD-10-CM

## 2019-04-08 ENCOUNTER — Other Ambulatory Visit: Payer: Self-pay | Admitting: Family Medicine

## 2019-04-08 DIAGNOSIS — R351 Nocturia: Secondary | ICD-10-CM

## 2019-04-15 ENCOUNTER — Telehealth: Payer: Self-pay | Admitting: Family Medicine

## 2019-04-15 MED ORDER — "INSULIN SYRINGE-NEEDLE U-100 31G X 5/16"" 0.5 ML MISC": 1.0000 | Freq: Four times a day (QID) | 3 refills | Status: DC

## 2019-04-15 NOTE — Telephone Encounter (Signed)
I resent the syringes because look like there was an error the first time

## 2019-04-15 NOTE — Patient Instructions (Signed)
Binnie Kandlbert Dwayne Dumais  04/15/2019     @PREFPERIOPPHARMACY @   Your procedure is scheduled on  04/22/2019.  [facility] at  [phone]  A.M.  Call this number if you have problems the morning of surgery:  [phone]   Remember:  Follow the diet and prep instructions given to you by Dr Luvenia Starchourk's office.                       Take these medicines the morning of surgery with A SIP OF WATER  Xanax(if needed), cymbalta, gabapentin, oxycodone(if needed), protonix. Use your inhaler before you come.      Take 1/2 of your usual insulin dosage the night before your procedure. DO NOT take any medications for diabetes the morning of your procedure.    Do not wear jewelry, make-up or nail polish.  Do not wear lotions, powders, or perfumes. Please wear deodorant and brush your teeth.  Do not shave 48 hours prior to surgery.  Men may shave face and neck.  Do not bring valuables to the hospital.  [facility] responsible for any belongings or valuables.  Contacts, dentures or bridgework may not be worn into surgery.  Leave your suitcase in the car.  After surgery it may be brought to your room.  For patients admitted to the hospital, discharge time will be determined by your treatment team.  Patients discharged the day of surgery will not be allowed to drive home.   Name and phone number of your driver:   family Special instructions:  SEE ABOVE  Please read over the following fact sheets that you were given. Anesthesia Post-op Instructions and Care and Recovery After Surgery       Upper Endoscopy, Adult, Care After This sheet gives you information about how to care for yourself after your procedure. Your health care provider may also give you more specific instructions. If you have problems or questions, contact your health care provider. What can I expect after the procedure? After the procedure, it is common to have:  A sore throat.  Mild stomach pain or discomfort.   Bloating.  Nausea. Follow these instructions at home:   Follow instructions from your health care provider about what to eat or drink after your procedure.  Return to your normal activities as told by your health care provider. Ask your health care provider what activities are safe for you.  Take over-the-counter and prescription medicines only as told by your health care provider.  Do not drive for 24 hours if you were given a sedative during your procedure.  Keep all follow-up visits as told by your health care provider. This is important. Contact a health care provider if you have:  A sore throat that lasts longer than one day.  Trouble swallowing. Get help right away if:  You vomit blood or your vomit looks like coffee grounds.  You have: ? A fever. ? Bloody, black, or tarry stools. ? A severe sore throat or you cannot swallow. ? Difficulty breathing. ? Severe pain in your chest or abdomen. Summary  After the procedure, it is common to have a sore throat, mild stomach discomfort, bloating, and nausea.  Do not drive for 24 hours if you were given a sedative during the procedure.  Follow instructions from your health care provider about what to eat or drink after your procedure.  Return to your normal activities as told  by your health care provider. This information is not intended to replace advice given to you by your health care provider. Make sure you discuss any questions you have with your health care provider. Document Released: 02/25/2012 Document Revised: 02/17/2018 Document Reviewed: 01/26/2018 Elsevier Patient Education  2020 [employer]  Colonoscopy, Adult, Care After This sheet gives you information about how to care for yourself after your procedure. Your health care provider may also give you more specific instructions. If you have problems or questions, contact your health care provider. What can I expect after the procedure? After the procedure,  it is common to have:  A small amount of blood in your stool for 24 hours after the procedure.  Some gas.  Mild abdominal cramping or bloating. Follow these instructions at home: General instructions  For the first 24 hours after the procedure: ? Do not drive or use machinery. ? Do not sign important documents. ? Do not drink alcohol. ? Do your regular daily activities at a slower pace than normal. ? Eat soft, easy-to-digest foods.  Take over-the-counter or prescription medicines only as told by your health care provider. Relieving cramping and bloating   Try walking around when you have cramps or feel bloated.  Apply heat to your abdomen as told by your health care provider. Use a heat source that your health care provider recommends, such as a moist heat pack or a heating pad. ? Place a towel between your skin and the heat source. ? Leave the heat on for 20-30 minutes. ? Remove the heat if your skin turns bright red. This is especially important if you are unable to feel pain, heat, or cold. You may have a greater risk of getting burned. Eating and drinking   Drink enough fluid to keep your urine pale yellow.  Resume your normal diet as instructed by your health care provider. Avoid heavy or fried foods that are hard to digest.  Avoid drinking alcohol for as long as instructed by your health care provider. Contact a health care provider if:  You have blood in your stool 2-3 days after the procedure. Get help right away if:  You have more than a small spotting of blood in your stool.  You pass large blood clots in your stool.  Your abdomen is swollen.  You have nausea or vomiting.  You have a fever.  You have increasing abdominal pain that is not relieved with medicine. Summary  After the procedure, it is common to have a small amount of blood in your stool. You may also have mild abdominal cramping and bloating.  For the first 24 hours after the procedure, do  not drive or use machinery, sign important documents, or drink alcohol.  Contact your health care provider if you have a lot of blood in your stool, nausea or vomiting, a fever, or increased abdominal pain. This information is not intended to replace advice given to you by your health care provider. Make sure you discuss any questions you have with your health care provider. Document Released: 04/09/2004 Document Revised: 06/18/2017 Document Reviewed: 11/07/2015 Elsevier Patient Education  2020 [employer] Inc[employer] Monitored Anesthesia Care, Care After These instructions provide you with information about caring for yourself after your procedure. Your health care provider may also give you more specific instructions. Your treatment has been planned according to current medical practices, but problems sometimes occur. Call your health care provider if you have any problems or questions after your procedure. What can I  expect after the procedure? After your procedure, you may:  Feel sleepy for several hours.  Feel clumsy and have poor balance for several hours.  Feel forgetful about what happened after the procedure.  Have poor judgment for several hours.  Feel nauseous or vomit.  Have a sore throat if you had a breathing tube during the procedure. Follow these instructions at home: For at least 24 hours after the procedure:      Have a responsible adult stay with you. It is important to have someone help care for you until you are awake and alert.  Rest as needed.  Do not: ? Participate in activities in which you could fall or become injured. ? Drive. ? Use heavy machinery. ? Drink alcohol. ? Take sleeping pills or medicines that cause drowsiness. ? Make important decisions or sign legal documents. ? Take care of children on your own. Eating and drinking  Follow the diet that is recommended by your health care provider.  If you vomit, drink water, juice, or soup when you can  drink without vomiting.  Make sure you have little or no nausea before eating solid foods. General instructions  Take over-the-counter and prescription medicines only as told by your health care provider.  If you have sleep apnea, surgery and certain medicines can increase your risk for breathing problems. Follow instructions from your health care provider about wearing your sleep device: ? Anytime you are sleeping, including during daytime naps. ? While taking prescription pain medicines, sleeping medicines, or medicines that make you drowsy.  If you smoke, do not smoke without supervision.  Keep all follow-up visits as told by your health care provider. This is important. Contact a health care provider if:  You keep feeling nauseous or you keep vomiting.  You feel light-headed.  You develop a rash.  You have a fever. Get help right away if:  You have trouble breathing. Summary  For several hours after your procedure, you may feel sleepy and have poor judgment.  Have a responsible adult stay with you for at least 24 hours or until you are awake and alert. This information is not intended to replace advice given to you by your health care provider. Make sure you discuss any questions you have with your health care provider. Document Released: 12/17/2015 Document Revised: 11/24/2017 Document Reviewed: 12/17/2015 Elsevier Patient Education  2020 [employer].

## 2019-04-15 NOTE — Telephone Encounter (Signed)
-----   Message from Willy Eddy, RN sent at 04/13/2019  4:06 PM EDT ----- Regarding: Epic error Good afternoon Dr. Warrick Parisian   Do to an issue with Epic the refill order did not go through or was auto cancelled.  We are working with Epic and hope to have this issue resolved by 04/29/19.  We apologize for any inconvenience this may cause.  Please place a new order for the medication.  03/03/2019 BD INSULIN SYRINGE U/F 30G X 1/2" 1 ML MISC   Thank you,  Zebedee Iba, RN, BSN Ambulatory Analyst

## 2019-04-15 NOTE — Telephone Encounter (Signed)
Patient aware,per voice mail, syringe  script is ready.

## 2019-04-19 ENCOUNTER — Encounter (HOSPITAL_COMMUNITY)
Admission: RE | Admit: 2019-04-19 | Discharge: 2019-04-19 | Disposition: A | Discharge status: Home / Self Care | Payer: Medicare Other | Source: Ambulatory Visit | Attending: Internal Medicine | Admitting: Internal Medicine

## 2019-04-19 ENCOUNTER — Other Ambulatory Visit: Payer: Self-pay

## 2019-04-19 ENCOUNTER — Encounter (HOSPITAL_COMMUNITY): Payer: Self-pay | Admitting: Anesthesiology

## 2019-04-19 ENCOUNTER — Encounter (HOSPITAL_COMMUNITY): Payer: Self-pay

## 2019-04-19 ENCOUNTER — Other Ambulatory Visit (HOSPITAL_COMMUNITY)
Admission: RE | Admit: 2019-04-19 | Discharge: 2019-04-19 | Disposition: A | Discharge status: Home / Self Care | Payer: Medicare Other | Source: Ambulatory Visit | Attending: Internal Medicine | Admitting: Internal Medicine

## 2019-04-20 ENCOUNTER — Telehealth: Payer: Self-pay | Admitting: Internal Medicine

## 2019-04-20 NOTE — Telephone Encounter (Signed)
Received a message patient did show up for his PAT

## 2019-04-20 NOTE — Telephone Encounter (Signed)
Melanie called to say patient was a no show for her covid test °

## 2019-04-20 NOTE — Telephone Encounter (Signed)
Called patient. He stated to cancel everything for right now. He doesn't have any transportation right now to take him anywhere. He will call back to r/s. Called endo as an FYI. Sending to [facility] as well

## 2019-04-20 NOTE — Telephone Encounter (Signed)
Noted  

## 2019-04-22 ENCOUNTER — Encounter (HOSPITAL_COMMUNITY): Admission: RE | Payer: Self-pay | Source: Home / Self Care

## 2019-04-22 ENCOUNTER — Other Ambulatory Visit: Payer: Self-pay

## 2019-04-22 ENCOUNTER — Ambulatory Visit (HOSPITAL_COMMUNITY): Admission: RE | Admit: 2019-04-22 | Payer: Medicare Other | Source: Home / Self Care | Admitting: Internal Medicine

## 2019-04-22 SURGERY — COLONOSCOPY WITH PROPOFOL
Anesthesia: Monitor Anesthesia Care

## 2019-04-23 ENCOUNTER — Encounter: Payer: Self-pay | Admitting: Family Medicine

## 2019-04-23 ENCOUNTER — Ambulatory Visit (INDEPENDENT_AMBULATORY_CARE_PROVIDER_SITE_OTHER): Payer: Medicare Other | Admitting: Family Medicine

## 2019-04-23 VITALS — BP 136/84 | HR 81 | Temp 97.7°F | Ht 73.0 in | Wt 178.6 lb

## 2019-04-23 DIAGNOSIS — M5136 Other intervertebral disc degeneration, lumbar region: Secondary | ICD-10-CM

## 2019-04-23 DIAGNOSIS — M51369 Other intervertebral disc degeneration, lumbar region without mention of lumbar back pain or lower extremity pain: Secondary | ICD-10-CM

## 2019-04-23 DIAGNOSIS — I10 Essential (primary) hypertension: Secondary | ICD-10-CM | POA: Diagnosis not present

## 2019-04-23 DIAGNOSIS — F410 Panic disorder [episodic paroxysmal anxiety] without agoraphobia: Secondary | ICD-10-CM

## 2019-04-23 DIAGNOSIS — R351 Nocturia: Secondary | ICD-10-CM | POA: Diagnosis not present

## 2019-04-23 DIAGNOSIS — E1169 Type 2 diabetes mellitus with other specified complication: Secondary | ICD-10-CM | POA: Diagnosis not present

## 2019-04-23 DIAGNOSIS — K219 Gastro-esophageal reflux disease without esophagitis: Secondary | ICD-10-CM

## 2019-04-23 DIAGNOSIS — E785 Hyperlipidemia, unspecified: Secondary | ICD-10-CM

## 2019-04-23 DIAGNOSIS — E039 Hypothyroidism, unspecified: Secondary | ICD-10-CM | POA: Diagnosis not present

## 2019-04-23 DIAGNOSIS — E1165 Type 2 diabetes mellitus with hyperglycemia: Secondary | ICD-10-CM

## 2019-04-23 DIAGNOSIS — Z794 Long term (current) use of insulin: Secondary | ICD-10-CM | POA: Diagnosis not present

## 2019-04-23 DIAGNOSIS — E1140 Type 2 diabetes mellitus with diabetic neuropathy, unspecified: Secondary | ICD-10-CM | POA: Diagnosis not present

## 2019-04-23 DIAGNOSIS — E1159 Type 2 diabetes mellitus with other circulatory complications: Secondary | ICD-10-CM

## 2019-04-23 LAB — BAYER DCA HB A1C WAIVED: HB A1C (BAYER DCA - WAIVED): 8.6 % — ABNORMAL HIGH (ref <7.0)

## 2019-04-23 MED ORDER — MIRTAZAPINE 45 MG PO TABS: 45.0000 mg | ORAL_TABLET | Freq: Every day | ORAL | 1 refills | Status: DC

## 2019-04-23 MED ORDER — OXYCODONE-ACETAMINOPHEN 5-325 MG PO TABS: 1.0000 | ORAL_TABLET | Freq: Three times a day (TID) | ORAL | 0 refills | Status: DC | PRN

## 2019-04-23 MED ORDER — FREESTYLE LIBRE 14 DAY SENSOR MISC: 1.0000 | 3 refills | Status: DC

## 2019-04-23 MED ORDER — JARDIANCE 10 MG PO TABS: 10.0000 mg | ORAL_TABLET | Freq: Every day | ORAL | 3 refills | Status: DC

## 2019-04-23 MED ORDER — FREESTYLE LIBRE 14 DAY READER DEVI: 1.0000 | Freq: Four times a day (QID) | 1 refills | Status: DC

## 2019-04-23 MED ORDER — METFORMIN HCL 1000 MG PO TABS: 1000.0000 mg | ORAL_TABLET | Freq: Two times a day (BID) | ORAL | 3 refills | Status: DC

## 2019-04-23 MED ORDER — PANTOPRAZOLE SODIUM 40 MG PO TBEC: 40.0000 mg | DELAYED_RELEASE_TABLET | Freq: Two times a day (BID) | ORAL | 3 refills | Status: DC

## 2019-04-23 MED ORDER — ALPRAZOLAM 0.5 MG PO TABS: 0.5000 mg | ORAL_TABLET | Freq: Every day | ORAL | 2 refills | Status: DC | PRN

## 2019-04-23 MED ORDER — GABAPENTIN 300 MG PO CAPS: 300.0000 mg | ORAL_CAPSULE | Freq: Two times a day (BID) | ORAL | 3 refills | Status: DC

## 2019-04-23 MED ORDER — DULOXETINE HCL 60 MG PO CPEP: 120.0000 mg | ORAL_CAPSULE | Freq: Every day | ORAL | 3 refills | Status: DC

## 2019-04-23 MED ORDER — GEMFIBROZIL 600 MG PO TABS: 600.0000 mg | ORAL_TABLET | Freq: Two times a day (BID) | ORAL | 3 refills | Status: DC

## 2019-04-23 MED ORDER — ALBUTEROL SULFATE HFA 108 (90 BASE) MCG/ACT IN AERS: 2.0000 | INHALATION_SPRAY | Freq: Four times a day (QID) | RESPIRATORY_TRACT | 11 refills | Status: DC | PRN

## 2019-04-23 MED ORDER — TAMSULOSIN HCL 0.4 MG PO CAPS: 0.4000 mg | ORAL_CAPSULE | Freq: Every day | ORAL | 3 refills | Status: DC

## 2019-04-23 MED ORDER — LISINOPRIL 5 MG PO TABS: 5.0000 mg | ORAL_TABLET | Freq: Every day | ORAL | 3 refills | Status: DC

## 2019-04-23 NOTE — Progress Notes (Addendum)
BP 136/84   Pulse 81   Temp 97.7 F (36.5 C) (Temporal)   Ht 6\' 1"  (1.854 m)   Wt 178 lb 9.6 oz (81 kg)   BMI 23.56 kg/m    Subjective:   Patient ID: Jacob Bautista, male    DOB: 09-18-1960, 58 y.o.   MRN: [id number]  HPI: Jacob Bautista is a 58 y.o. male presenting on 04/23/2019 for Diabetes (3 month follow up)   HPI Pain assessment: Cause of pain-degenerative lumbar disease and anxiety Pain location-lower back Pain on scale of 1-10-7 Frequency-constant What increases pain-everything What makes pain Better-pain medication Effects on ADL -pain sometimes limits him but when he has the medication he can do just about everything he needs to do. Any change in general medical condition-none  Current opioids rx-oxycodone 5-3 25 3  times daily as needed and Xanax as needed # meds rx-90 oxycodone per month and 10 Xanax per month Effectiveness of current meds-Lantus and Humulin and Jardiance and metformin Adverse reactions form pain meds-none Morphine equivalent-32.5  Pill count performed-No Last drug screen -07/23/2018 ( high risk q78m, moderate risk q48m, low risk yearly ) Urine drug screen today- No Was the [place] reviewed-yes  If yes were their any concerning findings? -None  No flowsheet data found.   Pain contract signed on: 07/23/2018  Type 2 diabetes mellitus Patient comes in today for recheck of his diabetes. Patient has been currently taking Jardiance and Lantus and Humulin and metformin. Patient is currently on an ACE inhibitor/ARB. Patient has not seen an ophthalmologist this year. Patient denies any new issues with their feet.   Hypertension Patient is currently on lisinopril, and their blood pressure today is 136/84. Patient denies any lightheadedness or dizziness. Patient denies headaches, blurred vision, chest pains, shortness of breath, or weakness. Denies any side effects from medication and is content with current medication.   Hyperlipidemia  Patient is coming in for recheck of his hyperlipidemia. The patient is currently taking gemfibrozil. They deny any issues with myalgias or history of liver damage from it. They deny any focal numbness or weakness or chest pain.   Relevant past medical, surgical, family and social history reviewed and updated as indicated. Interim medical history since our last visit reviewed. Allergies and medications reviewed and updated.  Review of Systems  Constitutional: Negative for chills and fever.  Eyes: Negative for discharge.  Respiratory: Negative for shortness of breath and wheezing.   Cardiovascular: Negative for chest pain and leg swelling.  Musculoskeletal: Negative for back pain and gait problem.  Skin: Negative for rash.  Neurological: Negative for dizziness, weakness and numbness.  All other systems reviewed and are negative.   Per HPI unless specifically indicated above   Allergies as of 04/23/2019   No Known Allergies     Medication List       Accurate as of April 23, 2019 11:25 AM. If you have any questions, ask your nurse or doctor.        STOP taking these medications   amoxicillin-clavulanate 875-125 MG tablet Commonly known as: AUGMENTIN Stopped by: Worthy Rancher, MD   guaiFENesin 100 MG/5ML Soln Commonly known as: ROBITUSSIN Stopped by: Worthy Rancher, MD   polyethylene glycol-electrolytes 420 g solution Commonly known as: TriLyte Stopped by: Worthy Rancher, MD   predniSONE 20 MG tablet Commonly known as: DELTASONE Stopped by: Worthy Rancher, MD     TAKE these medications   albuterol 108 (90 Base) MCG/ACT inhaler Commonly known  as: VENTOLIN HFA Inhale 2 puffs into the lungs every 6 (six) hours as needed for wheezing or shortness of breath. Patient wants blue or red ventolin What changed:   how much to take  when to take this  additional instructions Changed by: Elige RadonJoshua A Dacia Capers, MD   ALPRAZolam 0.5 MG tablet Commonly known as:  Xanax Take 1 tablet (0.5 mg total) by mouth daily as needed for anxiety.   aspirin EC 81 MG tablet Take 81 mg by mouth daily.   DULoxetine 60 MG capsule Commonly known as: Cymbalta Take 2 capsules (120 mg total) by mouth daily.   FreeStyle Libre 14 Day Reader Hardie Pulleyevi 1 each by Does not apply route 4 (four) times daily. Started by: Nils PyleJoshua A Derryl Uher, MD   FreeStyle Libre 14 Day Sensor Misc 1 each by Does not apply route every 14 (fourteen) days. Started by: Elige RadonJoshua A Chiyeko Ferre, MD   gabapentin 300 MG capsule Commonly known as: NEURONTIN Take 1 capsule (300 mg total) by mouth 2 (two) times daily.   gemfibrozil 600 MG tablet Commonly known as: LOPID Take 1 tablet (600 mg total) by mouth 2 (two) times daily before a meal.   insulin glargine 100 UNIT/ML injection Commonly known as: Lantus ADMINISTER 50 UNITS UNDER THE SKIN AT BEDTIME   insulin regular 100 units/mL injection Commonly known as: HumuLIN R 6-10 units subcutaneous 2 times daily before a meal. Sliding scale   Insulin Syringe-Needle U-100 31G X 5/16" 0.5 ML Misc Commonly known as: BD Insulin Syringe U/F 1 each by Does not apply route 4 (four) times daily.   Jardiance 10 MG Tabs tablet Generic drug: empagliflozin Take 10 mg by mouth daily.   levothyroxine 50 MCG tablet Commonly known as: SYNTHROID TAKE 1 TABLET BY MOUTH ONCE DAILY BEFORE BREAKFAST What changed:   how much to take  how to take this  when to take this  additional instructions   lisinopril 5 MG tablet Commonly known as: ZESTRIL Take 1 tablet (5 mg total) by mouth daily.   metFORMIN 1000 MG tablet Commonly known as: GLUCOPHAGE Take 1 tablet (1,000 mg total) by mouth 2 (two) times daily with a meal.   mirtazapine 45 MG tablet Commonly known as: REMERON Take 1 tablet (45 mg total) by mouth at bedtime.   oxyCODONE-acetaminophen 5-325 MG tablet Commonly known as: PERCOCET/ROXICET Take 1 tablet by mouth every 8 (eight) hours as needed for  moderate pain or severe pain. Do not refill until 30 days from prescription date   oxyCODONE-acetaminophen 5-325 MG tablet Commonly known as: PERCOCET/ROXICET Take 1 tablet by mouth every 8 (eight) hours as needed for severe pain. Do not refill until 60 days from prescription date   oxyCODONE-acetaminophen 5-325 MG tablet Commonly known as: PERCOCET/ROXICET Take 1 tablet by mouth every 8 (eight) hours as needed for severe pain.   pantoprazole 40 MG tablet Commonly known as: PROTONIX Take 1 tablet (40 mg total) by mouth 2 (two) times daily before a meal.   tamsulosin 0.4 MG Caps capsule Commonly known as: FLOMAX Take 1 capsule (0.4 mg total) by mouth daily.        Objective:   BP 136/84   Pulse 81   Temp 97.7 F (36.5 C) (Temporal)   Ht 6\' 1"  (1.854 m)   Wt 178 lb 9.6 oz (81 kg)   BMI 23.56 kg/m   Wt Readings from Last 3 Encounters:  04/23/19 178 lb 9.6 oz (81 kg)  12/08/18 181 lb 6.4 oz (82.3  kg)  10/23/18 191 lb 12.8 oz (87 kg)    Physical Exam Vitals signs and nursing note reviewed.  Constitutional:      General: He is not in acute distress.    Appearance: He is well-developed. He is not diaphoretic.  Eyes:     General: No scleral icterus.    Conjunctiva/sclera: Conjunctivae normal.  Neck:     Musculoskeletal: Neck supple.     Thyroid: No thyromegaly.  Cardiovascular:     Rate and Rhythm: Normal rate and regular rhythm.     Heart sounds: Normal heart sounds. No murmur.  Pulmonary:     Effort: Pulmonary effort is normal. No respiratory distress.     Breath sounds: Normal breath sounds. No wheezing.  Musculoskeletal: Normal range of motion.  Lymphadenopathy:     Cervical: No cervical adenopathy.  Skin:    General: Skin is warm and dry.     Findings: No rash.  Neurological:     Mental Status: He is alert and oriented to person, place, and time.     Coordination: Coordination normal.  Psychiatric:        Behavior: Behavior normal.       Assessment &  Plan:   Problem List Items Addressed This Visit      Cardiovascular and Mediastinum   Hypertension associated with diabetes (HCC)   Relevant Medications   lisinopril (ZESTRIL) 5 MG tablet   gemfibrozil (LOPID) 600 MG tablet   empagliflozin (JARDIANCE) 10 MG TABS tablet   metFORMIN (GLUCOPHAGE) 1000 MG tablet     Digestive   GERD (gastroesophageal reflux disease)   Relevant Medications   pantoprazole (PROTONIX) 40 MG tablet     Endocrine   Hypothyroidism - Primary   Relevant Orders   TSH (Completed)     Nervous and Auditory   Type 2 diabetes mellitus with diabetic neuropathy, unspecified (HCC)   Relevant Medications   lisinopril (ZESTRIL) 5 MG tablet   oxyCODONE-acetaminophen (PERCOCET/ROXICET) 5-325 MG tablet   oxyCODONE-acetaminophen (PERCOCET/ROXICET) 5-325 MG tablet   oxyCODONE-acetaminophen (PERCOCET/ROXICET) 5-325 MG tablet   DULoxetine (CYMBALTA) 60 MG capsule   empagliflozin (JARDIANCE) 10 MG TABS tablet   gabapentin (NEURONTIN) 300 MG capsule   metFORMIN (GLUCOPHAGE) 1000 MG tablet   Continuous Blood Gluc Receiver (FREESTYLE LIBRE 14 DAY READER) DEVI   Continuous Blood Gluc Sensor (FREESTYLE LIBRE 14 DAY SENSOR) MISC   Other Relevant Orders   Bayer DCA Hb A1c Waived (Completed)     Other   Panic disorder   Relevant Medications   ALPRAZolam (XANAX) 0.5 MG tablet   mirtazapine (REMERON) 45 MG tablet   DULoxetine (CYMBALTA) 60 MG capsule   Other Relevant Orders   ToxASSURE Select 13 (MW), Urine    Other Visit Diagnoses    Essential hypertension, benign       Relevant Medications   lisinopril (ZESTRIL) 5 MG tablet   gemfibrozil (LOPID) 600 MG tablet   Hyperlipidemia associated with type 2 diabetes mellitus (HCC)       Relevant Medications   lisinopril (ZESTRIL) 5 MG tablet   gemfibrozil (LOPID) 600 MG tablet   empagliflozin (JARDIANCE) 10 MG TABS tablet   metFORMIN (GLUCOPHAGE) 1000 MG tablet   Other Relevant Orders   Lipid panel (Completed)   Nocturia        Relevant Medications   tamsulosin (FLOMAX) 0.4 MG CAPS capsule   Uncontrolled type 2 diabetes mellitus with hyperglycemia, with long-term current use of insulin (HCC)       Relevant  Medications   lisinopril (ZESTRIL) 5 MG tablet   DULoxetine (CYMBALTA) 60 MG capsule   empagliflozin (JARDIANCE) 10 MG TABS tablet   gabapentin (NEURONTIN) 300 MG capsule   metFORMIN (GLUCOPHAGE) 1000 MG tablet   Continuous Blood Gluc Receiver (FREESTYLE LIBRE 14 DAY READER) DEVI   Continuous Blood Gluc Sensor (FREESTYLE LIBRE 14 DAY SENSOR) MISC   Degenerative disc disease, lumbar       Relevant Medications   oxyCODONE-acetaminophen (PERCOCET/ROXICET) 5-325 MG tablet   oxyCODONE-acetaminophen (PERCOCET/ROXICET) 5-325 MG tablet   oxyCODONE-acetaminophen (PERCOCET/ROXICET) 5-325 MG tablet   Other Relevant Orders   ToxASSURE Select 13 (MW), Urine      Sounds like his diabetes is out of control, will send freestyle libre and recommended he increase his mealtime insulin and that he may have to decrease his long-acting insulin on the days that he is working in the heat because he gets the occasional lows on those days.  Patient is checking his blood sugars 4-6 times daily to try and help get control Follow up plan: Return in about 3 months (around 07/24/2019), or if symptoms worsen or fail to improve, for Diabetes and pain management.  Counseling provided for all of the vaccine components Orders Placed This Encounter  Procedures  . Bayer DCA Hb A1c Waived  . TSH  . Lipid panel  . ToxASSURE Select 13 (MW), Urine    Arville CareJoshua Rhett Mutschler, MD [facility] 04/23/2019, 11:25 AM

## 2019-04-24 LAB — LIPID PANEL
Chol/HDL Ratio: 3 ratio (ref 0.0–5.0)
Cholesterol, Total: 96 mg/dL — ABNORMAL LOW (ref 100–199)
HDL: 32 mg/dL — ABNORMAL LOW (ref >39)
LDL Calculated: 40 mg/dL (ref 0–99)
Triglycerides: 121 mg/dL (ref 0–149)
VLDL Cholesterol Cal: 24 mg/dL (ref 5–40)

## 2019-04-24 LAB — TSH: TSH: 1.54 u[IU]/mL (ref 0.450–4.500)

## 2019-04-26 ENCOUNTER — Telehealth: Payer: Self-pay | Admitting: *Deleted

## 2019-04-26 NOTE — Telephone Encounter (Signed)
Ok continue with previous meter

## 2019-04-26 NOTE — Telephone Encounter (Signed)
Pt called and LM to cont old meter.

## 2019-04-26 NOTE — Telephone Encounter (Signed)
Freestyle Libre 14 day Sensor and 14 day Reader not covered by pt insurance.  not covered at pharmacy; MD Sigmund Hazel [phone] for Dexcom and Libre;[facility] [phone] for [facility] onlyProd/Service Not CoveredPlan Exclusion

## 2019-04-27 ENCOUNTER — Ambulatory Visit (INDEPENDENT_AMBULATORY_CARE_PROVIDER_SITE_OTHER): Payer: Medicare Other | Admitting: Family

## 2019-04-27 ENCOUNTER — Encounter: Payer: Self-pay | Admitting: Family

## 2019-04-27 DIAGNOSIS — R6889 Other general symptoms and signs: Secondary | ICD-10-CM

## 2019-04-27 DIAGNOSIS — Z20822 Contact with and (suspected) exposure to covid-19: Secondary | ICD-10-CM

## 2019-04-27 DIAGNOSIS — J441 Chronic obstructive pulmonary disease with (acute) exacerbation: Secondary | ICD-10-CM

## 2019-04-27 MED ORDER — PREDNISONE 10 MG (21) PO TBPK: ORAL_TABLET | ORAL | 0 refills | Status: DC

## 2019-04-27 MED ORDER — DOXYCYCLINE HYCLATE 100 MG PO TABS: 100.0000 mg | ORAL_TABLET | Freq: Two times a day (BID) | ORAL | 0 refills | Status: DC

## 2019-04-27 NOTE — Progress Notes (Signed)
Virtual Visit via telephone Note Due to COVID-19 pandemic this visit was conducted virtually. This visit type was conducted due to national recommendations for restrictions regarding the COVID-19 Pandemic (e.g. social distancing, sheltering in place) in an effort to limit this patient's exposure and mitigate transmission in our community. All issues noted in this document were discussed and addressed.  A physical exam was not performed with this format.  I connected with Jacob Bautista on 04/27/19 at 11:13 AM by telephone and verified that I am speaking with the correct person using two identifiers. Jacob Bautista is currently located at store and no one is currently with her during visit. The provider, Jannifer Rodneyhristy Corde Antonini, FNP is located in their office at time of visit.  I discussed the limitations, risks, security and privacy concerns of performing an evaluation and management service by telephone and the availability of in person appointments. I also discussed with the patient that there may be a patient responsible charge related to this service. The patient expressed understanding and agreed to proceed.   History and Present Illness:  Cough This is a new problem. The current episode started in the past 7 days (2 days). The problem has been waxing and waning. The problem occurs every few minutes. The cough is productive of purulent sputum and productive of brown sputum. Associated symptoms include a fever, myalgias, nasal congestion, postnasal drip, shortness of breath, sweats and wheezing. Pertinent negatives include no chills, ear congestion, ear pain or headaches. Risk factors for lung disease include smoking/tobacco exposure. He has tried rest and OTC cough suppressant for the symptoms. The treatment provided mild relief. His past medical history is significant for COPD.      Review of Systems  Constitutional: Positive for fever. Negative for chills.  HENT: Positive for postnasal  drip. Negative for ear pain.   Respiratory: Positive for cough, shortness of breath and wheezing.   Musculoskeletal: Positive for myalgias.  Neurological: Negative for headaches.  All other systems reviewed and are negative.    Observations/Objective: No SOB or distress noted, coarse intermittent cough  Assessment and Plan: 1. COPD exacerbation (HCC) - doxycycline (VIBRA-TABS) 100 MG tablet; Take 1 tablet (100 mg total) by mouth 2 (two) times daily.  Dispense: 20 tablet; Refill: 0 - predniSONE (STERAPRED UNI-PAK 21 TAB) 10 MG (21) TBPK tablet; Use as directed  Dispense: 21 tablet; Refill: 0 - MyChart COVID-19 home monitoring program; Future  2. Suspected Covid-19 Virus Infection - MyChart COVID-19 home monitoring program; Future   Will go ahead and treat as COPD as we are waiting for COVID results.  Discussed importance of self-isolation until results return Red flags discussed to go to ED. He will call us if his symptoms worsen or do not improve. Rest Force fluids     I discussed the assessment and treatment plan with the patient. The patient was provided an opportunity to ask questions and all were answered. The patient agreed with the plan and demonstrated an understanding of the instructions.   The patient was advised to call back or seek an in-person evaluation if the symptoms worsen or if the condition fails to improve as anticipated.  The above assessment and management plan was discussed with the patient. The patient verbalized understanding of and has agreed to the management plan. Patient is aware to call the clinic if symptoms persist or worsen. Patient is aware when to return to the clinic for a follow-up visit. Patient educated on when it is appropriate to go  to the emergency department.   Time call ended:  11:23 AM  I provided 10 minutes of non-face-to-face time during this encounter.    Evelina Dun, FNP

## 2019-04-29 ENCOUNTER — Other Ambulatory Visit: Payer: Self-pay

## 2019-04-29 DIAGNOSIS — Z20822 Contact with and (suspected) exposure to covid-19: Secondary | ICD-10-CM

## 2019-04-29 DIAGNOSIS — R6889 Other general symptoms and signs: Secondary | ICD-10-CM | POA: Diagnosis not present

## 2019-04-30 LAB — NOVEL CORONAVIRUS, NAA: SARS-CoV-2, NAA: NOT DETECTED

## 2019-05-03 ENCOUNTER — Telehealth: Payer: Self-pay | Admitting: Family Medicine

## 2019-05-03 NOTE — Telephone Encounter (Signed)
Patient aware.

## 2019-05-03 NOTE — Telephone Encounter (Signed)
Yes his urine was not sufficient and he needs a urine drug screen, it needs to be done in person.

## 2019-05-06 ENCOUNTER — Ambulatory Visit: Payer: Medicare Other

## 2019-05-10 ENCOUNTER — Other Ambulatory Visit: Payer: Self-pay

## 2019-05-10 ENCOUNTER — Other Ambulatory Visit: Payer: Medicare Other

## 2019-05-10 ENCOUNTER — Other Ambulatory Visit: Payer: Self-pay | Admitting: Family Medicine

## 2019-05-10 DIAGNOSIS — M5136 Other intervertebral disc degeneration, lumbar region: Secondary | ICD-10-CM | POA: Diagnosis not present

## 2019-05-10 DIAGNOSIS — F410 Panic disorder [episodic paroxysmal anxiety] without agoraphobia: Secondary | ICD-10-CM

## 2019-05-11 NOTE — Telephone Encounter (Signed)
RTC to patient about RFs on this medication & his pain med Urine specimen was done on 05/10/19 Last medication was taken on 04/23/19

## 2019-05-11 NOTE — Telephone Encounter (Signed)
I do not see an order or any results from the urine specimen, it should have been a drug screen tox assure

## 2019-05-12 LAB — TOXASSURE SELECT 13 (MW), URINE

## 2019-05-14 ENCOUNTER — Other Ambulatory Visit: Payer: Self-pay | Admitting: Family Medicine

## 2019-05-14 ENCOUNTER — Ambulatory Visit (INDEPENDENT_AMBULATORY_CARE_PROVIDER_SITE_OTHER): Payer: Medicare Other | Admitting: *Deleted

## 2019-05-14 VITALS — Ht 73.0 in | Wt 178.6 lb

## 2019-05-14 DIAGNOSIS — Z0000 Encounter for general adult medical examination without abnormal findings: Secondary | ICD-10-CM
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
## 2019-05-14 NOTE — Patient Instructions (Signed)
Jacob Bautista , Thank you for taking time to come for your Medicare Wellness Visit. I appreciate your ongoing commitment to your health goals. Please review the following plan we discussed and let me know if I can assist you in the future.   These are the goals we discussed: Goals    . Exercise 3x per week (30 min per time)     Try to exercise for at least 30 minutes, 3 times weekly       This is a list of the screening recommended for you and due dates:  Health Maintenance  Topic Date Due  . Colon Cancer Screening  09/28/2011  . Eye exam for diabetics  06/25/2017  . Complete foot exam   01/13/2019  . Pneumococcal vaccine  10/24/2019*  . Flu Shot  01/28/2020*  . Hemoglobin A1C  10/24/2019  . Tetanus Vaccine  08/01/2025  .  Hepatitis C: One time screening is recommended by Center for Disease Control  (CDC) for  adults born from [birth year] through 1965.   Completed  . HIV Screening  Completed  *Topic was postponed. The date shown is not the original due date.     Preventive Care 55-16 Years Old, Male Preventive care refers to lifestyle choices and visits with your health care provider that can promote health and wellness. This includes:  A yearly physical exam. This is also called an annual well check.  Regular dental and eye exams.  Immunizations.  Screening for certain conditions.  Healthy lifestyle choices, such as eating a healthy diet, getting regular exercise, not using drugs or products that contain nicotine and tobacco, and limiting alcohol use. What can I expect for my preventive care visit? Physical exam Your health care provider will check:  Height and weight. These may be used to calculate body mass index (BMI), which is a measurement that tells if you are at a healthy weight.  Heart rate and blood pressure.  Your skin for abnormal spots. Counseling Your health care provider may ask you questions about:  Alcohol, tobacco, and drug use.  Emotional well-being.   Home and relationship well-being.  Sexual activity.  Eating habits.  Work and work Statistician. What immunizations do I need?  Influenza (flu) vaccine  This is recommended every year. Tetanus, diphtheria, and pertussis (Tdap) vaccine  You may need a Td booster every 10 years. Varicella (chickenpox) vaccine  You may need this vaccine if you have not already been vaccinated. Zoster (shingles) vaccine  You may need this after age 70. Measles, mumps, and rubella (MMR) vaccine  You may need at least one dose of MMR if you were born in 1957 or later. You may also need a second dose. Pneumococcal conjugate (PCV13) vaccine  You may need this if you have certain conditions and were not previously vaccinated. Pneumococcal polysaccharide (PPSV23) vaccine  You may need one or two doses if you smoke cigarettes or if you have certain conditions. Meningococcal conjugate (MenACWY) vaccine  You may need this if you have certain conditions. Hepatitis A vaccine  You may need this if you have certain conditions or if you travel or work in places where you may be exposed to hepatitis A. Hepatitis B vaccine  You may need this if you have certain conditions or if you travel or work in places where you may be exposed to hepatitis B. Haemophilus influenzae type b (Hib) vaccine  You may need this if you have certain risk factors. Human papillomavirus (HPV) vaccine  If  recommended by your health care provider, you may need three doses over 6 months. You may receive vaccines as individual doses or as more than one vaccine together in one shot (combination vaccines). Talk with your health care provider about the risks and benefits of combination vaccines. What tests do I need? Blood tests  Lipid and cholesterol levels. These may be checked every 5 years, or more frequently if you are over 14 years old.  Hepatitis C test.  Hepatitis B test. Screening  Lung cancer screening. You may have  this screening every year starting at age 40 if you have a 30-pack-year history of smoking and currently smoke or have quit within the past 15 years.  Prostate cancer screening. Recommendations will vary depending on your family history and other risks.  Colorectal cancer screening. All adults should have this screening starting at age 75 and continuing until age 83. Your health care provider may recommend screening at age 72 if you are at increased risk. You will have tests every 1-10 years, depending on your results and the type of screening test.  Diabetes screening. This is done by checking your blood sugar (glucose) after you have not eaten for a while (fasting). You may have this done every 1-3 years.  Sexually transmitted disease (STD) testing. Follow these instructions at home: Eating and drinking  Eat a diet that includes fresh fruits and vegetables, whole grains, lean protein, and low-fat dairy products.  Take vitamin and mineral supplements as recommended by your health care provider.  Do not drink alcohol if your health care provider tells you not to drink.  If you drink alcohol: ? Limit how much you have to 0-2 drinks a day. ? Be aware of how much alcohol is in your drink. In the [place] one drink equals one 12 oz bottle of beer (355 mL), one 5 oz glass of wine (148 mL), or one 1 oz glass of hard liquor (44 mL). Lifestyle  Take daily care of your teeth and gums.  Stay active. Exercise for at least 30 minutes on 5 or more days each week.  Do not use any products that contain nicotine or tobacco, such as cigarettes, e-cigarettes, and chewing tobacco. If you need help quitting, ask your health care provider.  If you are sexually active, practice safe sex. Use a condom or other form of protection to prevent STIs (sexually transmitted infections).  Talk with your health care provider about taking a low-dose aspirin every day starting at age 33. What's next?  Go to your health  care provider once a year for a well check visit.  Ask your health care provider how often you should have your eyes and teeth checked.  Stay up to date on all vaccines. This information is not intended to replace advice given to you by your health care provider. Make sure you discuss any questions you have with your health care provider. Document Released: 09/22/2015 Document Revised: 08/20/2018 Document Reviewed: 08/20/2018 Elsevier Patient Education  2020 [employer].

## 2019-05-25 ENCOUNTER — Other Ambulatory Visit: Payer: Self-pay | Admitting: *Deleted

## 2019-05-25 DIAGNOSIS — E1140 Type 2 diabetes mellitus with diabetic neuropathy, unspecified: Secondary | ICD-10-CM

## 2019-05-25 DIAGNOSIS — Z794 Long term (current) use of insulin: Secondary | ICD-10-CM

## 2019-05-25 MED ORDER — OXYCODONE-ACETAMINOPHEN 5-325 MG PO TABS: 1.0000 | ORAL_TABLET | Freq: Three times a day (TID) | ORAL | 0 refills | Status: DC | PRN

## 2019-05-25 NOTE — Telephone Encounter (Signed)
I had thought I ready sent these results when I saw the urine drug screen but I must not have sent these refills then, have sent them now.

## 2019-05-25 NOTE — Telephone Encounter (Signed)
Returning call to patient from VM received about pain meds needing to be called in [facility] to call back for more information on which medication, etc

## 2019-05-25 NOTE — Telephone Encounter (Signed)
Pt aware refills sent to pharmacy, pt has appt scheduled in November

## 2019-05-25 NOTE — Telephone Encounter (Signed)
Percocet Rx

## 2019-06-23 ENCOUNTER — Other Ambulatory Visit: Payer: Self-pay | Admitting: Family Medicine

## 2019-06-23 DIAGNOSIS — E1140 Type 2 diabetes mellitus with diabetic neuropathy, unspecified: Secondary | ICD-10-CM

## 2019-07-19 ENCOUNTER — Telehealth: Payer: Self-pay | Admitting: Family Medicine

## 2019-07-19 DIAGNOSIS — F410 Panic disorder [episodic paroxysmal anxiety] without agoraphobia: Secondary | ICD-10-CM

## 2019-07-19 NOTE — Telephone Encounter (Signed)
He knows that this has to be done in a visit, he needs a visit that is been 3 months since his last visit

## 2019-07-19 NOTE — Telephone Encounter (Signed)
Sorry, this will have to wait for PCP.

## 2019-07-19 NOTE — Telephone Encounter (Signed)
Covering pcp please advise. 

## 2019-07-20 MED ORDER — ALPRAZOLAM 0.5 MG PO TABS: 0.5000 mg | ORAL_TABLET | Freq: Every evening | ORAL | 0 refills | Status: DC | PRN

## 2019-07-20 NOTE — Telephone Encounter (Signed)
Tell him that I have sent in 1 to the [facility], we will need to make sure that the one at the other pharmacy [facility] gets canceled Caryl Pina, MD [place] 07/20/2019, 1:09 PM

## 2019-07-20 NOTE — Telephone Encounter (Signed)
He was not able to get the last RX of xanax from [facility]- they are out and do not know when they will come in = he has been out. He would like the last rx to be sent to [facility] instead of [facility] - the pharm can not forward controls.

## 2019-07-20 NOTE — Telephone Encounter (Signed)
Patient aware that new prescription was sent to [facility].  I contacted [facility] and cancelled the prescription they had on file.

## 2019-07-28 ENCOUNTER — Other Ambulatory Visit: Payer: Medicare Other

## 2019-07-28 ENCOUNTER — Ambulatory Visit: Payer: Medicare Other | Admitting: Family Medicine

## 2019-07-28 ENCOUNTER — Other Ambulatory Visit: Payer: Self-pay

## 2019-07-28 DIAGNOSIS — Z794 Long term (current) use of insulin: Secondary | ICD-10-CM | POA: Diagnosis not present

## 2019-07-28 DIAGNOSIS — E1169 Type 2 diabetes mellitus with other specified complication: Secondary | ICD-10-CM

## 2019-07-28 DIAGNOSIS — I10 Essential (primary) hypertension: Secondary | ICD-10-CM | POA: Diagnosis not present

## 2019-07-28 DIAGNOSIS — E1140 Type 2 diabetes mellitus with diabetic neuropathy, unspecified: Secondary | ICD-10-CM | POA: Diagnosis not present

## 2019-07-28 DIAGNOSIS — E785 Hyperlipidemia, unspecified: Secondary | ICD-10-CM | POA: Diagnosis not present

## 2019-07-28 LAB — BAYER DCA HB A1C WAIVED: HB A1C (BAYER DCA - WAIVED): 8.5 % — ABNORMAL HIGH (ref <7.0)

## 2019-07-29 LAB — CBC WITH DIFFERENTIAL/PLATELET
Basophils Absolute: 0 10*3/uL (ref 0.0–0.2)
Basos: 1 %
EOS (ABSOLUTE): 0.3 10*3/uL (ref 0.0–0.4)
Eos: 7 %
Hematocrit: 36.6 % — ABNORMAL LOW (ref 37.5–51.0)
Hemoglobin: 11.6 g/dL — ABNORMAL LOW (ref 13.0–17.7)
Immature Grans (Abs): 0 10*3/uL (ref 0.0–0.1)
Immature Granulocytes: 0 %
Lymphocytes Absolute: 1.6 10*3/uL (ref 0.7–3.1)
Lymphs: 31 %
MCH: 23.9 pg — ABNORMAL LOW (ref 26.6–33.0)
MCHC: 31.7 g/dL (ref 31.5–35.7)
MCV: 75 fL — ABNORMAL LOW (ref 79–97)
Monocytes Absolute: 0.3 10*3/uL (ref 0.1–0.9)
Monocytes: 6 %
Neutrophils Absolute: 2.9 10*3/uL (ref 1.4–7.0)
Neutrophils: 55 %
Platelets: 259 10*3/uL (ref 150–450)
RBC: 4.86 x10E6/uL (ref 4.14–5.80)
RDW: 15.3 % (ref 11.6–15.4)
WBC: 5.1 10*3/uL (ref 3.4–10.8)

## 2019-07-29 LAB — LIPID PANEL
Chol/HDL Ratio: 3.3 ratio (ref 0.0–5.0)
Cholesterol, Total: 110 mg/dL (ref 100–199)
HDL: 33 mg/dL — ABNORMAL LOW (ref >39)
LDL Chol Calc (NIH): 53 mg/dL (ref 0–99)
Triglycerides: 139 mg/dL (ref 0–149)
VLDL Cholesterol Cal: 24 mg/dL (ref 5–40)

## 2019-07-29 LAB — CMP14+EGFR
ALT: 16 IU/L (ref 0–44)
AST: 23 IU/L (ref 0–40)
Albumin/Globulin Ratio: 1.6 (ref 1.2–2.2)
Albumin: 4.4 g/dL (ref 3.8–4.9)
Alkaline Phosphatase: 101 IU/L (ref 39–117)
BUN/Creatinine Ratio: 11 (ref 9–20)
BUN: 9 mg/dL (ref 6–24)
Bilirubin Total: <0.2 mg/dL (ref 0.0–1.2)
CO2: 19 mmol/L — ABNORMAL LOW (ref 20–29)
Calcium: 9.7 mg/dL (ref 8.7–10.2)
Chloride: 100 mmol/L (ref 96–106)
Creatinine, Ser: 0.81 mg/dL (ref 0.76–1.27)
GFR calc Af Amer: 113 mL/min/{1.73_m2} (ref >59)
GFR calc non Af Amer: 98 mL/min/{1.73_m2} (ref >59)
Globulin, Total: 2.8 g/dL (ref 1.5–4.5)
Glucose: 312 mg/dL — ABNORMAL HIGH (ref 65–99)
Potassium: 4.9 mmol/L (ref 3.5–5.2)
Sodium: 138 mmol/L (ref 134–144)
Total Protein: 7.2 g/dL (ref 6.0–8.5)

## 2019-07-30 ENCOUNTER — Other Ambulatory Visit: Payer: Self-pay | Admitting: Family Medicine

## 2019-07-30 DIAGNOSIS — E1140 Type 2 diabetes mellitus with diabetic neuropathy, unspecified: Secondary | ICD-10-CM

## 2019-07-30 DIAGNOSIS — E1165 Type 2 diabetes mellitus with hyperglycemia: Secondary | ICD-10-CM

## 2019-08-02 ENCOUNTER — Ambulatory Visit (INDEPENDENT_AMBULATORY_CARE_PROVIDER_SITE_OTHER): Payer: Medicare Other | Admitting: Family Medicine

## 2019-08-02 ENCOUNTER — Encounter: Payer: Self-pay | Admitting: Family Medicine

## 2019-08-02 DIAGNOSIS — Z794 Long term (current) use of insulin: Secondary | ICD-10-CM | POA: Diagnosis not present

## 2019-08-02 DIAGNOSIS — E1159 Type 2 diabetes mellitus with other circulatory complications: Secondary | ICD-10-CM

## 2019-08-02 DIAGNOSIS — F410 Panic disorder [episodic paroxysmal anxiety] without agoraphobia: Secondary | ICD-10-CM | POA: Diagnosis not present

## 2019-08-02 DIAGNOSIS — E039 Hypothyroidism, unspecified: Secondary | ICD-10-CM

## 2019-08-02 DIAGNOSIS — E1140 Type 2 diabetes mellitus with diabetic neuropathy, unspecified: Secondary | ICD-10-CM | POA: Diagnosis not present

## 2019-08-02 DIAGNOSIS — I152 Hypertension secondary to endocrine disorders: Secondary | ICD-10-CM

## 2019-08-02 DIAGNOSIS — I10 Essential (primary) hypertension: Secondary | ICD-10-CM

## 2019-08-02 MED ORDER — ALPRAZOLAM 0.5 MG PO TABS: 0.5000 mg | ORAL_TABLET | Freq: Every evening | ORAL | 2 refills | Status: DC | PRN

## 2019-08-02 MED ORDER — OXYCODONE-ACETAMINOPHEN 5-325 MG PO TABS: 1.0000 | ORAL_TABLET | Freq: Three times a day (TID) | ORAL | 0 refills | Status: DC | PRN

## 2019-08-02 NOTE — Progress Notes (Signed)
Virtual Visit via telephone Note  I connected with Jacob Bautista on 08/02/19 at 1400 by telephone and verified that I am speaking with the correct person using two identifiers. Jacob Bautista is currently located at home and no other people are currently with her during visit. The provider, Fransisca Kaufmann Devine Klingel, MD is located in their office at time of visit.  Call ended at 1415  I discussed the limitations, risks, security and privacy concerns of performing an evaluation and management service by telephone and the availability of in person appointments. I also discussed with the patient that there may be a patient responsible charge related to this service. The patient expressed understanding and agreed to proceed.   History and Present Illness: Pain assessment: Cause of pain-chronic degenerative disc disease and anxiety Pain location-lower back Pain on scale of 1-10- 5 Frequency-Daily What increases pain-day-to-day normal stuff What makes pain Better-medication including gabapentin and Cymbalta Effects on ADL -limits ability to have a job or walk for any distances. Any change in general medical condition-uncontrolled diabetes  Current opioids rx-oxycodone 5-3 25 every 8 hours as needed and Xanax 0.5 mg nightly as needed # meds rx-90 and 30 Effectiveness of current meds-working well for the most part Adverse reactions form pain meds-none Morphine equivalent-22.5  Pill count performed-No Last drug screen -04/30/2019 ( high risk q58m, moderate risk q46m, low risk yearly ) Urine drug screen today- No, virtual, will need 1 at next appointment Was the [place] reviewed-yes  If yes were their any concerning findings? -None  No flowsheet data found.   Pain contract signed on: 04/30/2019  Type 2 diabetes mellitus Patient comes in today for recheck of his diabetes. Patient has been currently taking Jardiance and Lantus and Humulin and metformin. Patient is currently on an ACE  inhibitor/ARB. Patient has not seen an ophthalmologist this year. Patient denies any new issues with their feet.  Patient does have neuropathy  Hypothyroidism recheck Patient is coming in for thyroid recheck today as well. They deny any issues with hair changes or heat or cold problems or diarrhea or constipation. They deny any chest pain or palpitations. They are currently on levothyroxine 50 micrograms   Hypertension Patient is currently on lisinopril, and their blood pressure today is unknown because he does not have a way to check blood pressure.. Patient denies any lightheadedness or dizziness. Patient denies headaches, blurred vision, chest pains, shortness of breath, or weakness. Denies any side effects from medication and is content with current medication.   No diagnosis found.  Outpatient Encounter Medications as of 08/02/2019  Medication Sig   Accu-Chek FastClix Lancets MISC USE TO CHECK GLUCOSE TWICE DAILY AND AS NEEDED   ACCU-CHEK GUIDE test strip USE TO TEST GLUCOSE TWICE DAILY AND AS NEEDED   albuterol (VENTOLIN HFA) 108 (90 Base) MCG/ACT inhaler Inhale 2 puffs into the lungs every 6 (six) hours as needed for wheezing or shortness of breath. Patient wants blue or red ventolin   ALPRAZolam (XANAX) 0.5 MG tablet Take 1 tablet (0.5 mg total) by mouth at bedtime as needed for anxiety.   aspirin EC 81 MG tablet Take 81 mg by mouth daily.   Continuous Blood Gluc Receiver (FREESTYLE LIBRE 14 DAY READER) DEVI 1 each by Does not apply route 4 (four) times daily.   Continuous Blood Gluc Sensor (FREESTYLE LIBRE 14 DAY SENSOR) MISC 1 each by Does not apply route every 14 (fourteen) days.   doxycycline (VIBRA-TABS) 100 MG tablet Take 1 tablet (  100 mg total) by mouth 2 (two) times daily.   DULoxetine (CYMBALTA) 60 MG capsule Take 2 capsules (120 mg total) by mouth daily.   empagliflozin (JARDIANCE) 10 MG TABS tablet Take 10 mg by mouth daily.   gabapentin (NEURONTIN) 300 MG capsule  TAKE 1 CAPSULE(300 MG) BY MOUTH TWICE DAILY   gemfibrozil (LOPID) 600 MG tablet Take 1 tablet (600 mg total) by mouth 2 (two) times daily before a meal.   insulin glargine (LANTUS) 100 UNIT/ML injection ADMINISTER 50 UNITS UNDER THE SKIN AT BEDTIME   insulin regular (HUMULIN R) 100 units/mL injection 6-10 units subcutaneous 2 times daily before a meal. Sliding scale   Insulin Syringe-Needle U-100 (BD INSULIN SYRINGE U/F) 31G X 5/16" 0.5 ML MISC 1 each by Does not apply route 4 (four) times daily.   levothyroxine (SYNTHROID, LEVOTHROID) 50 MCG tablet TAKE 1 TABLET BY MOUTH ONCE DAILY BEFORE BREAKFAST (Patient taking differently: Take 50 mcg by mouth daily before breakfast. )   lisinopril (ZESTRIL) 5 MG tablet Take 1 tablet (5 mg total) by mouth daily.   metFORMIN (GLUCOPHAGE) 1000 MG tablet Take 1 tablet (1,000 mg total) by mouth 2 (two) times daily with a meal.   mirtazapine (REMERON) 45 MG tablet Take 1 tablet (45 mg total) by mouth at bedtime.   oxyCODONE-acetaminophen (PERCOCET/ROXICET) 5-325 MG tablet Take 1 tablet by mouth every 8 (eight) hours as needed for moderate pain or severe pain. Do not refill until 30 days from prescription date   oxyCODONE-acetaminophen (PERCOCET/ROXICET) 5-325 MG tablet Take 1 tablet by mouth every 8 (eight) hours as needed for severe pain. Do not refill until 60 days from prescription date   oxyCODONE-acetaminophen (PERCOCET/ROXICET) 5-325 MG tablet Take 1 tablet by mouth every 8 (eight) hours as needed for severe pain.   pantoprazole (PROTONIX) 40 MG tablet Take 1 tablet (40 mg total) by mouth 2 (two) times daily before a meal.   predniSONE (STERAPRED UNI-PAK 21 TAB) 10 MG (21) TBPK tablet Use as directed   tamsulosin (FLOMAX) 0.4 MG CAPS capsule Take 1 capsule (0.4 mg total) by mouth daily.   No facility-administered encounter medications on file as of 08/02/2019.     Review of Systems  Constitutional: Negative for chills and fever.  Eyes:  Negative for discharge.  Respiratory: Negative for shortness of breath and wheezing.   Cardiovascular: Negative for chest pain and leg swelling.  Musculoskeletal: Positive for back pain. Negative for gait problem.  Skin: Negative for rash.  Neurological: Negative for dizziness, weakness, light-headedness and numbness.  Psychiatric/Behavioral: Positive for dysphoric mood. Negative for self-injury and suicidal ideas. The patient is nervous/anxious.   All other systems reviewed and are negative.   Observations/Objective: Patient sounds comfortable and in no acute distress  Assessment and Plan: Problem List Items Addressed This Visit      Cardiovascular and Mediastinum   Hypertension associated with diabetes (HCC)     Endocrine   Type 2 diabetes mellitus with diabetic neuropathy, unspecified (HCC) - Primary   Hypothyroidism     Other   Panic disorder      Patient will increase sliding scale and pre meal slightly by 5 units and see if that improves, his A1c was 8.5 last week Follow Up Instructions: Follow up in 3 months    I discussed the assessment and treatment plan with the patient. The patient was provided an opportunity to ask questions and all were answered. The patient agreed with the plan and demonstrated an understanding of the  instructions.   The patient was advised to call back or seek an in-person evaluation if the symptoms worsen or if the condition fails to improve as anticipated.  The above assessment and management plan was discussed with the patient. The patient verbalized understanding of and has agreed to the management plan. Patient is aware to call the clinic if symptoms persist or worsen. Patient is aware when to return to the clinic for a follow-up visit. Patient educated on when it is appropriate to go to the emergency department.    I provided 15 minutes of non-face-to-face time during this encounter.    Nils Pyle, MD

## 2019-08-20 ENCOUNTER — Ambulatory Visit (INDEPENDENT_AMBULATORY_CARE_PROVIDER_SITE_OTHER): Payer: Medicare Other | Admitting: Family

## 2019-08-20 ENCOUNTER — Other Ambulatory Visit: Payer: Self-pay

## 2019-08-20 ENCOUNTER — Encounter: Payer: Self-pay | Admitting: Family

## 2019-08-20 DIAGNOSIS — J441 Chronic obstructive pulmonary disease with (acute) exacerbation: Secondary | ICD-10-CM

## 2019-08-20 MED ORDER — BENZONATATE 200 MG PO CAPS: 200.0000 mg | ORAL_CAPSULE | Freq: Three times a day (TID) | ORAL | 1 refills | Status: DC | PRN

## 2019-08-20 MED ORDER — DOXYCYCLINE HYCLATE 100 MG PO TABS: 100.0000 mg | ORAL_TABLET | Freq: Two times a day (BID) | ORAL | 0 refills | Status: DC

## 2019-08-20 NOTE — Progress Notes (Addendum)
Virtual Visit via telephone Note Due to COVID-19 pandemic this visit was conducted virtually. This visit type was conducted due to national recommendations for restrictions regarding the COVID-19 Pandemic (e.g. social distancing, sheltering in place) in an effort to limit this patient's exposure and mitigate transmission in our community. All issues noted in this document were discussed and addressed.  A physical exam was not performed with this format.  Called patient at 2:57 pm, no answer, VM left. Called patient at 3:09 pm, no answer, VM left   I connected with Jacob Bautista on 08/20/19 at 3:58 pm by telephone and verified that I am speaking with the correct person using two identifiers. Jacob Bautista is currently located at home and no one is currently with him during visit. The provider, Evelina Dun, FNP is located in their office at time of visit.  I discussed the limitations, risks, security and privacy concerns of performing an evaluation and management service by telephone and the availability of in person appointments. I also discussed with the patient that there may be a patient responsible charge related to this service. The patient expressed understanding and agreed to proceed.   History and Present Illness:  Cough This is a new problem. The current episode started 1 to 4 weeks ago. The problem has been gradually worsening. The problem occurs every few minutes. The cough is non-productive. Associated symptoms include chills, nasal congestion, shortness of breath and wheezing. Pertinent negatives include no ear congestion, ear pain, fever, headaches, myalgias, rhinorrhea or sore throat. Risk factors for lung disease include smoking/tobacco exposure. He has tried rest for the symptoms. The treatment provided no relief. His past medical history is significant for COPD.      Review of Systems  Constitutional: Positive for chills. Negative for fever.  HENT: Negative  for ear pain, rhinorrhea and sore throat.   Respiratory: Positive for cough, shortness of breath and wheezing.   Musculoskeletal: Negative for myalgias.  Neurological: Negative for headaches.  All other systems reviewed and are negative.    Observations/Objective: No SOB or distress noted, intermittent coarse cough  Assessment and Plan: 1. COPD exacerbation ([place]) Rest Can not give steroids because blood glucose is elevated, last A1C was 8.5. Smoking cessation discussed Call office if symptoms worsen or do not improve - doxycycline (VIBRA-TABS) 100 MG tablet; Take 1 tablet (100 mg total) by mouth 2 (two) times daily.  Dispense: 20 tablet; Refill: 0 - benzonatate (TESSALON) 200 MG capsule; Take 1 capsule (200 mg total) by mouth 3 (three) times daily as needed.  Dispense: 30 capsule; Refill: 1      I discussed the assessment and treatment plan with the patient. The patient was provided an opportunity to ask questions and all were answered. The patient agreed with the plan and demonstrated an understanding of the instructions.   The patient was advised to call back or seek an in-person evaluation if the symptoms worsen or if the condition fails to improve as anticipated.  The above assessment and management plan was discussed with the patient. The patient verbalized understanding of and has agreed to the management plan. Patient is aware to call the clinic if symptoms persist or worsen. Patient is aware when to return to the clinic for a follow-up visit. Patient educated on when it is appropriate to go to the emergency department.   Time call ended: 4:08 pm   I provided 10 minutes of non-face-to-face time during this encounter.    Evelina Dun, FNP

## 2019-08-20 NOTE — Addendum Note (Signed)
Addended by: Evelina Dun A on: 08/20/2019 04:07 PM   Modules accepted: Orders, Level of Service

## 2019-08-28 ENCOUNTER — Other Ambulatory Visit: Payer: Self-pay | Admitting: Family Medicine

## 2019-08-28 DIAGNOSIS — E039 Hypothyroidism, unspecified: Secondary | ICD-10-CM

## 2019-10-05 ENCOUNTER — Other Ambulatory Visit: Payer: Self-pay | Admitting: Family Medicine

## 2019-10-05 DIAGNOSIS — F410 Panic disorder [episodic paroxysmal anxiety] without agoraphobia: Secondary | ICD-10-CM

## 2019-11-15 ENCOUNTER — Other Ambulatory Visit: Payer: Self-pay

## 2019-11-15 ENCOUNTER — Ambulatory Visit (INDEPENDENT_AMBULATORY_CARE_PROVIDER_SITE_OTHER): Payer: Medicare Other | Admitting: Family Medicine

## 2019-11-15 ENCOUNTER — Encounter: Payer: Self-pay | Admitting: Family Medicine

## 2019-11-15 VITALS — BP 98/60 | HR 106 | Temp 97.8°F | Ht 72.0 in | Wt 171.4 lb

## 2019-11-15 DIAGNOSIS — G5601 Carpal tunnel syndrome, right upper limb: Secondary | ICD-10-CM

## 2019-11-15 DIAGNOSIS — E1140 Type 2 diabetes mellitus with diabetic neuropathy, unspecified: Secondary | ICD-10-CM

## 2019-11-15 DIAGNOSIS — Z794 Long term (current) use of insulin: Secondary | ICD-10-CM | POA: Diagnosis not present

## 2019-11-15 DIAGNOSIS — F410 Panic disorder [episodic paroxysmal anxiety] without agoraphobia: Secondary | ICD-10-CM

## 2019-11-15 DIAGNOSIS — M5136 Other intervertebral disc degeneration, lumbar region: Secondary | ICD-10-CM

## 2019-11-15 DIAGNOSIS — E1159 Type 2 diabetes mellitus with other circulatory complications: Secondary | ICD-10-CM

## 2019-11-15 DIAGNOSIS — I10 Essential (primary) hypertension: Secondary | ICD-10-CM

## 2019-11-15 DIAGNOSIS — K219 Gastro-esophageal reflux disease without esophagitis: Secondary | ICD-10-CM | POA: Diagnosis not present

## 2019-11-15 DIAGNOSIS — Z1211 Encounter for screening for malignant neoplasm of colon: Secondary | ICD-10-CM

## 2019-11-15 DIAGNOSIS — N401 Enlarged prostate with lower urinary tract symptoms: Secondary | ICD-10-CM

## 2019-11-15 DIAGNOSIS — E039 Hypothyroidism, unspecified: Secondary | ICD-10-CM | POA: Diagnosis not present

## 2019-11-15 DIAGNOSIS — R351 Nocturia: Secondary | ICD-10-CM

## 2019-11-15 DIAGNOSIS — I152 Hypertension secondary to endocrine disorders: Secondary | ICD-10-CM

## 2019-11-15 LAB — BAYER DCA HB A1C WAIVED: HB A1C (BAYER DCA - WAIVED): 9 % — ABNORMAL HIGH (ref <7.0)

## 2019-11-15 MED ORDER — OXYCODONE-ACETAMINOPHEN 5-325 MG PO TABS: 1.0000 | ORAL_TABLET | Freq: Three times a day (TID) | ORAL | 0 refills | Status: DC | PRN

## 2019-11-15 MED ORDER — ALPRAZOLAM 0.5 MG PO TABS: 0.5000 mg | ORAL_TABLET | Freq: Every evening | ORAL | 2 refills | Status: DC | PRN

## 2019-11-15 NOTE — Progress Notes (Signed)
BP 98/60   Pulse (!) 106   Temp 97.8 F (36.6 C)   Ht 6' (1.829 m)   Wt 171 lb 6 oz (77.7 kg)   SpO2 100%   BMI 23.24 kg/m    Subjective:   Patient ID: Jacob Bautista, male    DOB: 12/12/1960, 59 y.o.   MRN: [id number]  HPI: Jacob Bautista is a 59 y.o. male presenting on 11/15/2019 for Medical Management of Chronic Issues and Diabetes   HPI Pain assessment: Cause of pain-chronic back pain and degenerative disc disease, patient also has taken for anxiety Pain location-lower back Pain on scale of 1-10- 6 Frequency-Daily What increases pain-weather changes and seasonal changes and standing What makes pain Better-oxycodone and Goody powders Effects on ADL -limits ability to work on a consistent basis Any change in general medical condition-none  Current opioids rx-oxycodone 5-3 25 every 8 hours as needed, alprazolam 0.5 mg nightly as needed # meds rx-90, 30 Effectiveness of current meds-works well Adverse reactions form pain meds-none Morphine equivalent-22.5  Pill count performed-No Last drug screen -04/2019 ( high risk q63m, moderate risk q72m, low risk yearly ) Urine drug screen today- Yes, last drug screen he was not on it was not detected so we will do today. Was the [facility] reviewed-yes  If yes were their any concerning findings? -None   Overdose risk: Have discussed with patient, we are monitoring but he does have a slight risk, number is 150 No flowsheet data found.   Pain contract signed on: 04/2019  Type 2 diabetes mellitus Patient comes in today for recheck of his diabetes. Patient has been currently taking Metformin and Jardiance and Lantus and Humulin R. Patient is currently on an ACE inhibitor/ARB. Patient has not seen an ophthalmologist this year. Patient denies any issues with their feet.   Hypertension Patient is currently on lisinopril , and their blood pressure today is 98/60. Patient denies any lightheadedness or dizziness. Patient denies  headaches, blurred vision, chest pains, shortness of breath, or weakness. Denies any side effects from medication and is content with current medication.   COPD Patient is coming in for COPD recheck today.  He is currently on albuterol but he says is having some issues.  He has a mild chronic cough but denies any major coughing spells or wheezing spells.  He has 5nighttime symptoms per week and 7daytime symptoms per week currently.   BPH Patient is coming in for recheck on BPH Symptoms: None Medication: Flomax Last PSA: More than a year ago  Relevant past medical, surgical, family and social history reviewed and updated as indicated. Interim medical history since our last visit reviewed. Allergies and medications reviewed and updated.  Review of Systems  Constitutional: Negative for chills and fever.  Eyes: Negative for discharge and visual disturbance.  Respiratory: Negative for shortness of breath and wheezing.   Cardiovascular: Negative for chest pain and leg swelling.  Musculoskeletal: Positive for back pain. Negative for gait problem.  Skin: Negative for rash.  Neurological: Negative for dizziness, weakness and light-headedness.  All other systems reviewed and are negative.   Per HPI unless specifically indicated above   Allergies as of 11/15/2019   No Known Allergies     Medication List       Accurate as of November 15, 2019  1:10 PM. If you have any questions, ask your nurse or doctor.        STOP taking these medications   benzonatate 200 MG capsule Commonly  known as: TESSALON Stopped by: Nils Pyle, MD   doxycycline 100 MG tablet Commonly known as: VIBRA-TABS Stopped by: Nils Pyle, MD   FreeStyle Libre 14 Day Reader Hardie Pulley Stopped by: Elige Radon Dettinger, MD   FreeStyle Libre 14 Day Sensor Misc Stopped by: Elige Radon Dettinger, MD     TAKE these medications   Accu-Chek FastClix Lancets Misc USE TO CHECK GLUCOSE TWICE DAILY AND AS NEEDED    Accu-Chek Guide test strip Generic drug: glucose blood USE TO TEST GLUCOSE TWICE DAILY AND AS NEEDED   albuterol 108 (90 Base) MCG/ACT inhaler Commonly known as: VENTOLIN HFA Inhale 2 puffs into the lungs every 6 (six) hours as needed for wheezing or shortness of breath. Patient wants blue or red ventolin   ALPRAZolam 0.5 MG tablet Commonly known as: XANAX Take 1 tablet (0.5 mg total) by mouth at bedtime as needed for anxiety.   aspirin EC 81 MG tablet Take 81 mg by mouth daily.   DULoxetine 60 MG capsule Commonly known as: Cymbalta Take 2 capsules (120 mg total) by mouth daily.   gabapentin 300 MG capsule Commonly known as: NEURONTIN TAKE 1 CAPSULE(300 MG) BY MOUTH TWICE DAILY   gemfibrozil 600 MG tablet Commonly known as: LOPID Take 1 tablet (600 mg total) by mouth 2 (two) times daily before a meal.   insulin glargine 100 UNIT/ML injection Commonly known as: Lantus ADMINISTER 50 UNITS UNDER THE SKIN AT BEDTIME   insulin regular 100 units/mL injection Commonly known as: HumuLIN R 6-10 units subcutaneous 2 times daily before a meal. Sliding scale   Insulin Syringe-Needle U-100 31G X 5/16" 0.5 ML Misc Commonly known as: BD Insulin Syringe U/F 1 each by Does not apply route 4 (four) times daily.   Jardiance 10 MG Tabs tablet Generic drug: empagliflozin Take 10 mg by mouth daily.   levothyroxine 50 MCG tablet Commonly known as: SYNTHROID TAKE 1 TABLET BY MOUTH EVERY DAY BEFORE BREAKFAST   lisinopril 5 MG tablet Commonly known as: ZESTRIL Take 1 tablet (5 mg total) by mouth daily.   metFORMIN 1000 MG tablet Commonly known as: GLUCOPHAGE Take 1 tablet (1,000 mg total) by mouth 2 (two) times daily with a meal.   mirtazapine 45 MG tablet Commonly known as: REMERON TAKE 1 TABLET(45 MG) BY MOUTH AT BEDTIME   oxyCODONE-acetaminophen 5-325 MG tablet Commonly known as: PERCOCET/ROXICET Take 1 tablet by mouth every 8 (eight) hours as needed for severe pain.    oxyCODONE-acetaminophen 5-325 MG tablet Commonly known as: PERCOCET/ROXICET Take 1 tablet by mouth every 8 (eight) hours as needed for severe pain.   oxyCODONE-acetaminophen 5-325 MG tablet Commonly known as: PERCOCET/ROXICET Take 1 tablet by mouth every 8 (eight) hours as needed for moderate pain or severe pain.   pantoprazole 40 MG tablet Commonly known as: PROTONIX Take 1 tablet (40 mg total) by mouth 2 (two) times daily before a meal.   tamsulosin 0.4 MG Caps capsule Commonly known as: FLOMAX Take 1 capsule (0.4 mg total) by mouth daily.        Objective:   BP 98/60   Pulse (!) 106   Temp 97.8 F (36.6 C)   Ht 6' (1.829 m)   Wt 171 lb 6 oz (77.7 kg)   SpO2 100%   BMI 23.24 kg/m   Wt Readings from Last 3 Encounters:  11/15/19 171 lb 6 oz (77.7 kg)  05/14/19 178 lb 9.2 oz (81 kg)  04/23/19 178 lb 9.6 oz (81 kg)  Physical Exam Vitals and nursing note reviewed.  Constitutional:      General: He is not in acute distress.    Appearance: He is well-developed. He is not diaphoretic.  Eyes:     General: No scleral icterus.    Conjunctiva/sclera: Conjunctivae normal.  Neck:     Thyroid: No thyromegaly.  Cardiovascular:     Rate and Rhythm: Normal rate and regular rhythm.     Heart sounds: Normal heart sounds. No murmur.  Pulmonary:     Effort: Pulmonary effort is normal. No respiratory distress.     Breath sounds: Normal breath sounds. No wheezing.  Musculoskeletal:        General: Normal range of motion.     Cervical back: Neck supple.  Lymphadenopathy:     Cervical: No cervical adenopathy.  Skin:    General: Skin is warm and dry.     Findings: No rash.  Neurological:     Mental Status: He is alert and oriented to person, place, and time.     Coordination: Coordination normal.  Psychiatric:        Behavior: Behavior normal.      Assessment & Plan:   Problem List Items Addressed This Visit      Cardiovascular and Mediastinum   Hypertension  associated with diabetes (HCC)     Digestive   GERD (gastroesophageal reflux disease)     Endocrine   Type 2 diabetes mellitus with diabetic neuropathy, unspecified (HCC) - Primary   Relevant Medications   oxyCODONE-acetaminophen (PERCOCET/ROXICET) 5-325 MG tablet (Start on 01/20/2020)   oxyCODONE-acetaminophen (PERCOCET/ROXICET) 5-325 MG tablet (Start on 12/21/2019)   oxyCODONE-acetaminophen (PERCOCET/ROXICET) 5-325 MG tablet (Start on 11/20/2019)   Other Relevant Orders   Bayer DCA Hb A1c Waived (Completed)   TSH   Hypothyroidism     Genitourinary   BPH (benign prostatic hyperplasia)   Relevant Orders   PSA, total and free     Other   Panic disorder   Relevant Medications   ALPRAZolam (XANAX) 0.5 MG tablet (Start on 12/10/2019)   Other Relevant Orders   ToxASSURE Select 13 (MW), Urine    Other Visit Diagnoses    Colon cancer screening       Relevant Orders   Ambulatory referral to Gastroenterology   Degenerative disc disease, lumbar       Relevant Medications   oxyCODONE-acetaminophen (PERCOCET/ROXICET) 5-325 MG tablet (Start on 01/20/2020)   oxyCODONE-acetaminophen (PERCOCET/ROXICET) 5-325 MG tablet (Start on 12/21/2019)   oxyCODONE-acetaminophen (PERCOCET/ROXICET) 5-325 MG tablet (Start on 11/20/2019)   Other Relevant Orders   ToxASSURE Select 13 (MW), Urine   Right carpal tunnel syndrome       Relevant Medications   ALPRAZolam (XANAX) 0.5 MG tablet (Start on 12/10/2019)    Gave samples for Trelegy  Continue alprazolam and oxycodone as they are currently, last drug screen was failed but he was off of it for a period of time because he had run out, will do another drug screen today.  Continue current medication for diabetes, recommended possibly reducing his insulin long-acting and increasing slightly his short acting but he will work with it and if it is running high or low and he will call us back.  Follow up plan: Return in about 3 months (around 02/15/2020), or if  symptoms worsen or fail to improve, for Diabetes and pain management.  Counseling provided for all of the vaccine components Orders Placed This Encounter  Procedures  . Bayer DCA Hb A1c Waived  Caryl Pina, MD [facility] 11/15/2019, 1:10 PM

## 2019-11-16 ENCOUNTER — Encounter: Payer: Self-pay | Admitting: Internal Medicine

## 2019-11-16 LAB — PSA, TOTAL AND FREE
PSA, Free Pct: 33.3 %
PSA, Free: 0.1 ng/mL
Prostate Specific Ag, Serum: 0.3 ng/mL (ref 0.0–4.0)

## 2019-11-16 LAB — TSH: TSH: 0.811 u[IU]/mL (ref 0.450–4.500)

## 2019-11-19 LAB — TOXASSURE SELECT 13 (MW), URINE

## 2019-11-30 ENCOUNTER — Telehealth: Payer: Self-pay | Admitting: Family Medicine

## 2019-11-30 NOTE — Telephone Encounter (Signed)
  Medication Request  11/30/2019  What is the name of the medication? Pt said his pain and xanax were not called in the other day  Have you contacted your pharmacy to request a refill? eys   Which pharmacy would you like this sent to? [facility]   Patient notified that their request is being sent to the clinical staff for review and that they should receive a call once it is complete. If they do not receive a call within 24 hours they can check with their pharmacy or our office.

## 2019-11-30 NOTE — Telephone Encounter (Signed)
TC to [facility] on [address] They did not receive the 3/13 Rx's for the Oxycodone-Acetaminophen's x 3

## 2019-12-01 NOTE — Telephone Encounter (Signed)
Yes they were called and canceled because patient contracts call including cocaine and fentanyl, I can no longer prescribe these medicines.  Discuss at patient's next visit

## 2019-12-01 NOTE — Telephone Encounter (Signed)
Lmtcb.

## 2019-12-23 ENCOUNTER — Telehealth: Payer: Self-pay | Admitting: Family Medicine

## 2019-12-23 NOTE — Chronic Care Management (AMB) (Signed)
  Chronic Care Management   Outreach Note  12/23/2019 Name: Jacob Bautista MRN: [id number] DOB: 12/03/1960  Jacob Bautista is a 59 y.o. year old male who is a primary care patient of Dettinger, Elige Radon, MD. I reached out to Jacob Bautista by phone today in response to a referral sent by Jacob Bautista's health plan.     An unsuccessful telephone outreach was attempted today. The patient was referred to the case management team for assistance with care management and care coordination.   Follow Up Plan: The care management team will reach out to the patient again over the next 7 days. If patient returns call to provider office, please advise to call Embedded Care Management Care Guide Jacob Bautista at [phone].  Jacob Bautista  Care Guide, Embedded Care Coordination [facility]  [place], [place] [address] Direct Dial: [phone] [email or web address] Website: [email or web address]

## 2019-12-27 NOTE — Chronic Care Management (AMB) (Signed)
  Chronic Care Management   Outreach Note  12/27/2019 Name: Jacob Bautista MRN: [id number] DOB: 17-Oct-1960  [name] is a 59 y.o. year old male who is a primary care patient of Dettinger, Elige Radon, MD. I reached out to Binnie Kand by phone today in response to a referral sent by [name]'s health plan.     A second unsuccessful telephone outreach was attempted today. The patient was referred to the case management team for assistance with care management and care coordination.   Follow Up Plan: A HIPPA compliant phone message was left for the patient providing contact information and requesting a return call. The care management team will reach out to the patient again over the next 7 days. If patient returns call to provider office, please advise to call Embedded Care Management Care Guide Gwenevere Ghazi at [phone].  Gwenevere Ghazi  Care Guide, Embedded Care Coordination [facility]  [place], [place] [address] Direct Dial: [phone] [email or web address] Website: [email or web address]

## 2019-12-28 ENCOUNTER — Telehealth: Payer: Self-pay | Admitting: Family Medicine

## 2019-12-29 NOTE — Chronic Care Management (AMB) (Signed)
  Chronic Care Management   Note  12/29/2019 Name: Christin Mccreedy MRN: [id number] DOB: 1960-11-20  Peter Minium Phifer is a 59 y.o. year old male who is a primary care patient of Dettinger, Fransisca Kaufmann, MD. I reached out to Park Pope by phone today in response to a referral sent by Mr. Peter Minium Olafson's health plan.     Mr. Seider was given information about Chronic Care Management services today including:  1. CCM service includes personalized support from designated clinical staff supervised by his physician, including individualized plan of care and coordination with other care providers 2. 24/7 contact phone numbers for assistance for urgent and routine care needs. 3. Service will only be billed when office clinical staff spend 20 minutes or more in a month to coordinate care. 4. Only one practitioner may furnish and bill the service in a calendar month. 5. The patient may stop CCM services at any time (effective at the end of the month) by phone call to the office staff. 6. The patient will be responsible for cost sharing (co-pay) of up to 20% of the service fee (after annual deductible is met).  Patient agreed to services and verbal consent obtained.   Follow up plan: Telephone appointment with care management team member scheduled for:02/18/2020.  [place], [place] [address] Direct Dial: [phone] [email or web address] Website: [email or web address]

## 2020-01-03 ENCOUNTER — Other Ambulatory Visit: Payer: Self-pay | Admitting: Family Medicine

## 2020-01-03 DIAGNOSIS — F410 Panic disorder [episodic paroxysmal anxiety] without agoraphobia: Secondary | ICD-10-CM

## 2020-01-05 ENCOUNTER — Encounter: Payer: Self-pay | Admitting: Gastroenterology

## 2020-01-06 ENCOUNTER — Ambulatory Visit: Payer: Medicare Other | Admitting: Gastroenterology

## 2020-01-14 ENCOUNTER — Other Ambulatory Visit: Payer: Self-pay | Admitting: Family Medicine

## 2020-01-14 ENCOUNTER — Other Ambulatory Visit: Payer: Self-pay | Admitting: Gastroenterology

## 2020-01-14 DIAGNOSIS — Z794 Long term (current) use of insulin: Secondary | ICD-10-CM

## 2020-01-14 DIAGNOSIS — E1140 Type 2 diabetes mellitus with diabetic neuropathy, unspecified: Secondary | ICD-10-CM

## 2020-01-14 NOTE — Telephone Encounter (Signed)
Stacey, refilling X 1. Needs appt thereafter.

## 2020-01-17 ENCOUNTER — Encounter: Payer: Self-pay | Admitting: Internal Medicine

## 2020-01-17 NOTE — Telephone Encounter (Signed)
Sent patient letter to schedule appointment  °

## 2020-02-03 ENCOUNTER — Other Ambulatory Visit: Payer: Self-pay | Admitting: Family Medicine

## 2020-02-08 ENCOUNTER — Other Ambulatory Visit: Payer: Self-pay

## 2020-02-08 MED ORDER — ACCU-CHEK GUIDE VI STRP: ORAL_STRIP | 11 refills | Status: AC

## 2020-02-10 ENCOUNTER — Encounter: Payer: Self-pay | Admitting: Family Medicine

## 2020-02-10 ENCOUNTER — Ambulatory Visit: Payer: Medicare Other | Admitting: Family Medicine

## 2020-02-15 ENCOUNTER — Other Ambulatory Visit: Payer: Self-pay | Admitting: Family Medicine

## 2020-02-18 ENCOUNTER — Ambulatory Visit: Payer: Medicare Other | Admitting: Licensed Clinical Social Worker

## 2020-02-18 DIAGNOSIS — F3177 Bipolar disorder, in partial remission, most recent episode mixed: Secondary | ICD-10-CM

## 2020-02-18 DIAGNOSIS — F410 Panic disorder [episodic paroxysmal anxiety] without agoraphobia: Secondary | ICD-10-CM

## 2020-02-18 DIAGNOSIS — I152 Hypertension secondary to endocrine disorders: Secondary | ICD-10-CM

## 2020-02-18 DIAGNOSIS — K219 Gastro-esophageal reflux disease without esophagitis: Secondary | ICD-10-CM

## 2020-02-18 DIAGNOSIS — J441 Chronic obstructive pulmonary disease with (acute) exacerbation: Secondary | ICD-10-CM

## 2020-02-18 DIAGNOSIS — E039 Hypothyroidism, unspecified: Secondary | ICD-10-CM

## 2020-02-18 NOTE — Chronic Care Management (AMB) (Signed)
Chronic Care Management    Clinical Social Work Follow Up Note  02/18/2020 Name: Adler Chartrand MRN: [id number] DOB: 07-31-1961  Kem Boroughs Manolis is a 59 y.o. year old male who is a primary care patient of Dettinger, Elige Radon, MD. The CCM team was consulted for assistance with [name]   Review of patient status, including review of consultants reports, other relevant assessments, and collaboration with appropriate care team members and the patient's provider was performed as part of comprehensive patient evaluation and provision of chronic care management services.    SDOH (Social Determinants of Health) assessments performed: No   Outpatient Encounter Medications as of 02/18/2020  Medication Sig  . Accu-Chek FastClix Lancets MISC CHECH GLUCOSE TWICE DAILY AS NEEDED  . albuterol (VENTOLIN HFA) 108 (90 Base) MCG/ACT inhaler Inhale 2 puffs into the lungs every 6 (six) hours as needed for wheezing or shortness of breath. Patient wants blue or red ventolin  . ALPRAZolam (XANAX) 0.5 MG tablet Take 1 tablet (0.5 mg total) by mouth at bedtime as needed for anxiety.  [name] aspirin EC 81 MG tablet Take 81 mg by mouth daily.  . DULoxetine (CYMBALTA) 60 MG capsule Take 2 capsules (120 mg total) by mouth daily.  . empagliflozin (JARDIANCE) 10 MG TABS tablet Take 10 mg by mouth daily.  [name] gabapentin (NEURONTIN) 300 MG capsule TAKE 1 CAPSULE(300 MG) BY MOUTH TWICE DAILY  . gemfibrozil (LOPID) 600 MG tablet Take 1 tablet (600 mg total) by mouth 2 (two) times daily before a meal.  . glucose blood (ACCU-CHEK GUIDE) test strip USE TO TEST GLUCOSE TWICE DAILY AND AS NEEDED  . insulin glargine (LANTUS) 100 UNIT/ML injection ADMINISTER 50 UNITS UNDER THE SKIN AT BEDTIME  . insulin regular (HUMULIN R) 100 units/mL injection ADMINISTER 6 TO 10 UNITS UNDER THE SKIN TWICE DAILY BEFORE A MEAL PER SLIDING SCALE  . Insulin Syringe-Needle U-100 (BD INSULIN SYRINGE U/F) 31G X 5/16" 0.5 ML MISC 1 each by  Does not apply route 4 (four) times daily.  [name] levothyroxine (SYNTHROID) 50 MCG tablet TAKE 1 TABLET BY MOUTH EVERY DAY BEFORE BREAKFAST  . lisinopril (ZESTRIL) 5 MG tablet Take 1 tablet (5 mg total) by mouth daily.  . metFORMIN (GLUCOPHAGE) 1000 MG tablet Take 1 tablet (1,000 mg total) by mouth 2 (two) times daily with a meal.  . mirtazapine (REMERON) 45 MG tablet TAKE 1 TABLET(45 MG) BY MOUTH AT BEDTIME  . oxyCODONE-acetaminophen (PERCOCET/ROXICET) 5-325 MG tablet Take 1 tablet by mouth every 8 (eight) hours as needed for moderate pain or severe pain.  [name] oxyCODONE-acetaminophen (PERCOCET/ROXICET) 5-325 MG tablet Take 1 tablet by mouth every 8 (eight) hours as needed for severe pain.  [name] oxyCODONE-acetaminophen (PERCOCET/ROXICET) 5-325 MG tablet Take 1 tablet by mouth every 8 (eight) hours as needed for severe pain.  . pantoprazole (PROTONIX) 40 MG tablet TAKE 1 TABLET BY MOUTH TWICE DAILY BEFORE A MEAL  . tamsulosin (FLOMAX) 0.4 MG CAPS capsule Take 1 capsule (0.4 mg total) by mouth daily.   No facility-administered encounter medications on file as of 02/18/2020.    LCSW called client phone number several times today but LCSW was not able to speak via phone with client today; however, LCSW did leave phone message for client today asking client to return call to LCSW at [phone]   Follow Up Plan: LCSW to call client in next 4 weeks to talk with client about CCM program resources  [name].Mikeyla Music MSW, [employer] Licensed Visual merchandiser  [facility] Care Management [phone]

## 2020-02-18 NOTE — Patient Instructions (Addendum)
Licensed Clinical Social Worker Visit Information  Materials Provided: No  02/18/2020  Name: Jacob Bautista            MRN: [id number]       DOB: June 23, 1961  Jacob Bautista is a 59 y.o. year old male who is a primary care patient of Dettinger, Elige Radon, MD. The CCM team was consulted for assistance with [name]   Review of patient status, including review of consultants reports, other relevant assessments, and collaboration with appropriate care team members and the patient's provider was performed as part of comprehensive patient evaluation and provision of chronic care management services.    SDOH (Social Determinants of Health) assessments performed: No    LCSW called client phone number several times today but LCSW was not able to speak via phone with client today; however, LCSW did leave phone message for client today asking client to return call to LCSW at [phone]   Follow Up Plan: LCSW to call client in next 4 weeks to talk with client about CCM program resources  LCSW was not able to speak via phone with client today; thus, the patient was not able to verbalize understanding of instructions provided today and was not able to accept or decline a print copy of patient instruction materials.  [name].Joushua Dugar MSW, LCSW Licensed Clinical Social Worker [facility] Care Management [phone]

## 2020-03-10 ENCOUNTER — Emergency Department (HOSPITAL_COMMUNITY): Payer: Medicare Other

## 2020-03-10 ENCOUNTER — Other Ambulatory Visit: Payer: Self-pay

## 2020-03-10 ENCOUNTER — Emergency Department (HOSPITAL_COMMUNITY)
Admission: EM | Admit: 2020-03-10 | Discharge: 2020-03-10 | Disposition: A | Discharge status: Home / Self Care | Payer: Medicare Other | Attending: Emergency Medicine | Admitting: Emergency Medicine

## 2020-03-10 ENCOUNTER — Encounter (HOSPITAL_COMMUNITY): Payer: Self-pay | Admitting: *Deleted

## 2020-03-10 DIAGNOSIS — R358 Other polyuria: Secondary | ICD-10-CM | POA: Diagnosis not present

## 2020-03-10 DIAGNOSIS — M25512 Pain in left shoulder: Secondary | ICD-10-CM | POA: Diagnosis not present

## 2020-03-10 DIAGNOSIS — R000 Tachycardia, unspecified: Secondary | ICD-10-CM | POA: Insufficient documentation

## 2020-03-10 DIAGNOSIS — E039 Hypothyroidism, unspecified: Secondary | ICD-10-CM | POA: Insufficient documentation

## 2020-03-10 DIAGNOSIS — I10 Essential (primary) hypertension: Secondary | ICD-10-CM | POA: Diagnosis not present

## 2020-03-10 DIAGNOSIS — M2550 Pain in unspecified joint: Secondary | ICD-10-CM | POA: Diagnosis not present

## 2020-03-10 DIAGNOSIS — Z7984 Long term (current) use of oral hypoglycemic drugs: Secondary | ICD-10-CM | POA: Insufficient documentation

## 2020-03-10 DIAGNOSIS — Z79899 Other long term (current) drug therapy: Secondary | ICD-10-CM | POA: Diagnosis not present

## 2020-03-10 DIAGNOSIS — R631 Polydipsia: Secondary | ICD-10-CM | POA: Diagnosis not present

## 2020-03-10 DIAGNOSIS — F17210 Nicotine dependence, cigarettes, uncomplicated: Secondary | ICD-10-CM | POA: Insufficient documentation

## 2020-03-10 DIAGNOSIS — S299XXA Unspecified injury of thorax, initial encounter: Secondary | ICD-10-CM | POA: Diagnosis not present

## 2020-03-10 DIAGNOSIS — E1140 Type 2 diabetes mellitus with diabetic neuropathy, unspecified: Secondary | ICD-10-CM | POA: Insufficient documentation

## 2020-03-10 DIAGNOSIS — J441 Chronic obstructive pulmonary disease with (acute) exacerbation: Secondary | ICD-10-CM | POA: Diagnosis not present

## 2020-03-10 DIAGNOSIS — R0602 Shortness of breath: Secondary | ICD-10-CM | POA: Insufficient documentation

## 2020-03-10 DIAGNOSIS — J439 Emphysema, unspecified: Secondary | ICD-10-CM | POA: Diagnosis not present

## 2020-03-10 DIAGNOSIS — R7989 Other specified abnormal findings of blood chemistry: Secondary | ICD-10-CM | POA: Diagnosis not present

## 2020-03-10 DIAGNOSIS — I2584 Coronary atherosclerosis due to calcified coronary lesion: Secondary | ICD-10-CM | POA: Diagnosis not present

## 2020-03-10 DIAGNOSIS — R0789 Other chest pain: Secondary | ICD-10-CM | POA: Diagnosis not present

## 2020-03-10 DIAGNOSIS — I2510 Atherosclerotic heart disease of native coronary artery without angina pectoris: Secondary | ICD-10-CM | POA: Diagnosis not present

## 2020-03-10 DIAGNOSIS — Z7951 Long term (current) use of inhaled steroids: Secondary | ICD-10-CM | POA: Insufficient documentation

## 2020-03-10 LAB — BASIC METABOLIC PANEL
Anion gap: 13 (ref 5–15)
BUN: 16 mg/dL (ref 6–20)
CO2: 18 mmol/L — ABNORMAL LOW (ref 22–32)
Calcium: 9.1 mg/dL (ref 8.9–10.3)
Chloride: 102 mmol/L (ref 98–111)
Creatinine, Ser: 0.81 mg/dL (ref 0.61–1.24)
GFR calc Af Amer: >60 mL/min (ref >60)
GFR calc non Af Amer: >60 mL/min (ref >60)
Glucose, Bld: 447 mg/dL — ABNORMAL HIGH (ref 70–99)
Potassium: 3.5 mmol/L (ref 3.5–5.1)
Sodium: 133 mmol/L — ABNORMAL LOW (ref 135–145)

## 2020-03-10 LAB — URINALYSIS, ROUTINE W REFLEX MICROSCOPIC
Bacteria, UA: NONE SEEN
Bilirubin Urine: NEGATIVE
Glucose, UA: >=500 mg/dL — AB
Hgb urine dipstick: NEGATIVE
Ketones, ur: NEGATIVE mg/dL
Leukocytes,Ua: NEGATIVE
Nitrite: NEGATIVE
Protein, ur: NEGATIVE mg/dL
Specific Gravity, Urine: 1.024 (ref 1.005–1.030)
pH: 6 (ref 5.0–8.0)

## 2020-03-10 LAB — D-DIMER, QUANTITATIVE: D-Dimer, Quant: 1.47 ug/mL-FEU — ABNORMAL HIGH (ref 0.00–0.50)

## 2020-03-10 LAB — CBC WITH DIFFERENTIAL/PLATELET
Abs Immature Granulocytes: 0.02 10*3/uL (ref 0.00–0.07)
Basophils Absolute: 0 10*3/uL (ref 0.0–0.1)
Basophils Relative: 0 %
Eosinophils Absolute: 0.1 10*3/uL (ref 0.0–0.5)
Eosinophils Relative: 1 %
HCT: 31.4 % — ABNORMAL LOW (ref 39.0–52.0)
Hemoglobin: 9.7 g/dL — ABNORMAL LOW (ref 13.0–17.0)
Immature Granulocytes: 0 %
Lymphocytes Relative: 12 %
Lymphs Abs: 1.1 10*3/uL (ref 0.7–4.0)
MCH: 24.2 pg — ABNORMAL LOW (ref 26.0–34.0)
MCHC: 30.9 g/dL (ref 30.0–36.0)
MCV: 78.3 fL — ABNORMAL LOW (ref 80.0–100.0)
Monocytes Absolute: 0.7 10*3/uL (ref 0.1–1.0)
Monocytes Relative: 7 %
Neutro Abs: 7.7 10*3/uL (ref 1.7–7.7)
Neutrophils Relative %: 80 %
Platelets: 233 10*3/uL (ref 150–400)
RBC: 4.01 MIL/uL — ABNORMAL LOW (ref 4.22–5.81)
RDW: 18 % — ABNORMAL HIGH (ref 11.5–15.5)
WBC: 9.5 10*3/uL (ref 4.0–10.5)
nRBC: 0 % (ref 0.0–0.2)

## 2020-03-10 LAB — LACTIC ACID, PLASMA
Lactic Acid, Venous: 3 mmol/L (ref 0.5–1.9)
Lactic Acid, Venous: 1.6 mmol/L (ref 0.5–1.9)

## 2020-03-10 LAB — TROPONIN I (HIGH SENSITIVITY)
Troponin I (High Sensitivity): 4 ng/L (ref <18)
Troponin I (High Sensitivity): 3 ng/L (ref <18)

## 2020-03-10 LAB — CBG MONITORING, ED
Glucose-Capillary: 452 mg/dL — ABNORMAL HIGH (ref 70–99)
Glucose-Capillary: 154 mg/dL — ABNORMAL HIGH (ref 70–99)

## 2020-03-10 MED ORDER — MORPHINE SULFATE (PF) 4 MG/ML IV SOLN
4.0000 mg | Freq: Once | INTRAVENOUS | Status: AC
  Administered 2020-03-10: 4 mg via INTRAVENOUS
  Filled 2020-03-10: qty 1

## 2020-03-10 MED ORDER — METHOCARBAMOL 500 MG PO TABS
500.0000 mg | ORAL_TABLET | Freq: Two times a day (BID) | ORAL | 0 refills | Status: DC
Start: 2020-03-10 — End: 2024-04-06

## 2020-03-10 MED ORDER — LIDOCAINE 5 % EX PTCH
1.0000 | MEDICATED_PATCH | CUTANEOUS | Status: DC
  Administered 2020-03-10: 1 via TRANSDERMAL
  Filled 2020-03-10: qty 1

## 2020-03-10 MED ORDER — INSULIN ASPART 100 UNIT/ML ~~LOC~~ SOLN
6.0000 [IU] | Freq: Once | SUBCUTANEOUS | Status: AC
  Administered 2020-03-10: 6 [IU] via SUBCUTANEOUS
  Filled 2020-03-10: qty 1

## 2020-03-10 MED ORDER — LIDOCAINE 5 % EX PTCH: 1.0000 | MEDICATED_PATCH | CUTANEOUS | 0 refills | Status: DC

## 2020-03-10 MED ORDER — SODIUM CHLORIDE 0.9 % IV BOLUS
1000.0000 mL | Freq: Once | INTRAVENOUS | Status: AC
  Administered 2020-03-10: 1000 mL via INTRAVENOUS

## 2020-03-10 MED ORDER — ONDANSETRON HCL 4 MG/2ML IJ SOLN
4.0000 mg | Freq: Once | INTRAMUSCULAR | Status: AC
  Administered 2020-03-10: 4 mg via INTRAVENOUS
  Filled 2020-03-10: qty 2

## 2020-03-10 MED ORDER — IOHEXOL 350 MG/ML SOLN
100.0000 mL | Freq: Once | INTRAVENOUS | Status: AC | PRN
  Administered 2020-03-10: 100 mL via INTRAVENOUS

## 2020-03-10 MED ORDER — HYDROCODONE-ACETAMINOPHEN 5-325 MG PO TABS: 1.0000 | ORAL_TABLET | ORAL | 0 refills | Status: DC | PRN

## 2020-03-10 NOTE — ED Notes (Signed)
CRITICAL VALUE ALERT  Critical Value:  Lactic Acid 3.0  Date & Time Notied:  03/10/20 1237  Provider Notified: Burgess Amor, PA  Orders Received/Actions taken: EDP notified
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
## 2020-03-10 NOTE — ED Notes (Signed)
Radiology at bedside

## 2020-03-10 NOTE — Discharge Instructions (Addendum)
Your lab tests and imaging are reassuring today with no signs of heart or lung disease as the source of todays pain symptoms.  You have been prescribed pain medicine and a muscle relaxer - use caution as these medicines can cause drowsiness - do not drive within 4 hours of taking hydrocodone.  The pain patch may also be helpful.  Call your MD for a recheck of your symptoms if not improved over the next week. You may also try a heating pad to the shoulder for 20 minutes several times daily. Call Dr. Dion Saucier for further evaluation of your shoulder pain.  Wear the sling for comfort.   Of note, your chest Ct scan shows that you have a calcification in a blood vessel of your heart - this is not the source of your symptoms today and not an emergent finding but it will be important for you to discuss this with your primary MD who can review this and decide if you need further evaluation by a cardiologist. In the interim, make sure you are continuing to take your baby aspirin as this is protective for your heart.

## 2020-03-10 NOTE — ED Notes (Signed)
Patient transported to CT 

## 2020-03-10 NOTE — ED Triage Notes (Signed)
Left shoulder pain for 5 days, states he was working on a car and afterward he began to hurt, has not seen a doctor for same, states movement is limited

## 2020-03-20 DIAGNOSIS — E1010 Type 1 diabetes mellitus with ketoacidosis without coma: Secondary | ICD-10-CM | POA: Diagnosis not present

## 2020-03-20 DIAGNOSIS — L02419 Cutaneous abscess of limb, unspecified: Secondary | ICD-10-CM | POA: Diagnosis not present

## 2020-03-20 DIAGNOSIS — S43102A Unspecified dislocation of left acromioclavicular joint, initial encounter: Secondary | ICD-10-CM | POA: Diagnosis not present

## 2020-03-20 DIAGNOSIS — D62 Acute posthemorrhagic anemia: Secondary | ICD-10-CM | POA: Diagnosis not present

## 2020-03-20 DIAGNOSIS — R351 Nocturia: Secondary | ICD-10-CM | POA: Diagnosis not present

## 2020-03-20 DIAGNOSIS — R0789 Other chest pain: Secondary | ICD-10-CM | POA: Diagnosis not present

## 2020-03-20 DIAGNOSIS — R7881 Bacteremia: Secondary | ICD-10-CM | POA: Diagnosis not present

## 2020-03-20 DIAGNOSIS — J441 Chronic obstructive pulmonary disease with (acute) exacerbation: Secondary | ICD-10-CM | POA: Diagnosis not present

## 2020-03-20 DIAGNOSIS — M25512 Pain in left shoulder: Secondary | ICD-10-CM | POA: Diagnosis not present

## 2020-03-20 DIAGNOSIS — S40012A Contusion of left shoulder, initial encounter: Secondary | ICD-10-CM | POA: Diagnosis not present

## 2020-03-20 DIAGNOSIS — R222 Localized swelling, mass and lump, trunk: Secondary | ICD-10-CM | POA: Diagnosis not present

## 2020-03-20 DIAGNOSIS — R52 Pain, unspecified: Secondary | ICD-10-CM | POA: Diagnosis not present

## 2020-03-20 DIAGNOSIS — Z20822 Contact with and (suspected) exposure to covid-19: Secondary | ICD-10-CM | POA: Diagnosis not present

## 2020-03-20 DIAGNOSIS — E1159 Type 2 diabetes mellitus with other circulatory complications: Secondary | ICD-10-CM | POA: Diagnosis not present

## 2020-03-20 DIAGNOSIS — S46012A Strain of muscle(s) and tendon(s) of the rotator cuff of left shoulder, initial encounter: Secondary | ICD-10-CM | POA: Diagnosis not present

## 2020-03-20 DIAGNOSIS — E039 Hypothyroidism, unspecified: Secondary | ICD-10-CM | POA: Diagnosis not present

## 2020-03-20 DIAGNOSIS — Z9111 Patient's noncompliance with dietary regimen: Secondary | ICD-10-CM | POA: Diagnosis not present

## 2020-03-20 DIAGNOSIS — S199XXA Unspecified injury of neck, initial encounter: Secondary | ICD-10-CM | POA: Diagnosis not present

## 2020-03-20 DIAGNOSIS — I10 Essential (primary) hypertension: Secondary | ICD-10-CM | POA: Diagnosis not present

## 2020-03-20 DIAGNOSIS — M25412 Effusion, left shoulder: Secondary | ICD-10-CM | POA: Diagnosis not present

## 2020-03-20 DIAGNOSIS — Z794 Long term (current) use of insulin: Secondary | ICD-10-CM | POA: Diagnosis not present

## 2020-03-20 DIAGNOSIS — B958 Unspecified staphylococcus as the cause of diseases classified elsewhere: Secondary | ICD-10-CM | POA: Diagnosis not present

## 2020-03-20 DIAGNOSIS — L02414 Cutaneous abscess of left upper limb: Secondary | ICD-10-CM | POA: Diagnosis not present

## 2020-03-20 DIAGNOSIS — D500 Iron deficiency anemia secondary to blood loss (chronic): Secondary | ICD-10-CM | POA: Diagnosis not present

## 2020-03-20 DIAGNOSIS — E8809 Other disorders of plasma-protein metabolism, not elsewhere classified: Secondary | ICD-10-CM | POA: Diagnosis not present

## 2020-03-20 DIAGNOSIS — R609 Edema, unspecified: Secondary | ICD-10-CM | POA: Diagnosis not present

## 2020-03-20 DIAGNOSIS — E119 Type 2 diabetes mellitus without complications: Secondary | ICD-10-CM | POA: Diagnosis not present

## 2020-03-20 DIAGNOSIS — M7989 Other specified soft tissue disorders: Secondary | ICD-10-CM | POA: Diagnosis not present

## 2020-03-20 DIAGNOSIS — R600 Localized edema: Secondary | ICD-10-CM | POA: Diagnosis not present

## 2020-03-20 DIAGNOSIS — M25519 Pain in unspecified shoulder: Secondary | ICD-10-CM | POA: Diagnosis not present

## 2020-03-20 DIAGNOSIS — E109 Type 1 diabetes mellitus without complications: Secondary | ICD-10-CM | POA: Diagnosis not present

## 2020-03-20 DIAGNOSIS — E1065 Type 1 diabetes mellitus with hyperglycemia: Secondary | ICD-10-CM | POA: Diagnosis not present

## 2020-03-20 DIAGNOSIS — E876 Hypokalemia: Secondary | ICD-10-CM | POA: Diagnosis not present

## 2020-03-20 DIAGNOSIS — S299XXA Unspecified injury of thorax, initial encounter: Secondary | ICD-10-CM | POA: Diagnosis not present

## 2020-03-20 DIAGNOSIS — B9561 Methicillin susceptible Staphylococcus aureus infection as the cause of diseases classified elsewhere: Secondary | ICD-10-CM | POA: Diagnosis not present

## 2020-03-20 DIAGNOSIS — Z9181 History of falling: Secondary | ICD-10-CM | POA: Diagnosis not present

## 2020-03-20 DIAGNOSIS — Z5329 Procedure and treatment not carried out because of patient's decision for other reasons: Secondary | ICD-10-CM | POA: Diagnosis not present

## 2020-03-20 DIAGNOSIS — S0990XA Unspecified injury of head, initial encounter: Secondary | ICD-10-CM | POA: Diagnosis not present

## 2020-03-20 DIAGNOSIS — S40012D Contusion of left shoulder, subsequent encounter: Secondary | ICD-10-CM | POA: Diagnosis not present

## 2020-03-20 DIAGNOSIS — B9562 Methicillin resistant Staphylococcus aureus infection as the cause of diseases classified elsewhere: Secondary | ICD-10-CM | POA: Diagnosis not present

## 2020-03-20 DIAGNOSIS — S4992XA Unspecified injury of left shoulder and upper arm, initial encounter: Secondary | ICD-10-CM | POA: Diagnosis not present

## 2020-03-20 DIAGNOSIS — R000 Tachycardia, unspecified: Secondary | ICD-10-CM | POA: Diagnosis not present

## 2020-03-20 DIAGNOSIS — M71012 Abscess of bursa, left shoulder: Secondary | ICD-10-CM | POA: Diagnosis not present

## 2020-03-20 DIAGNOSIS — E871 Hypo-osmolality and hyponatremia: Secondary | ICD-10-CM | POA: Diagnosis not present

## 2020-03-21 ENCOUNTER — Telehealth: Payer: Self-pay | Admitting: Family Medicine

## 2020-03-21 NOTE — Telephone Encounter (Signed)
Advised that if he is in the hospital before he was discharged he would be evaluated for OT/PT and home health if needed prior to discharge. Pt's daughter is on the DPR in chart.

## 2020-03-23 DIAGNOSIS — R7881 Bacteremia: Secondary | ICD-10-CM | POA: Insufficient documentation

## 2020-03-24 ENCOUNTER — Ambulatory Visit (INDEPENDENT_AMBULATORY_CARE_PROVIDER_SITE_OTHER): Payer: Medicare Other | Admitting: Licensed Clinical Social Worker

## 2020-03-24 DIAGNOSIS — J441 Chronic obstructive pulmonary disease with (acute) exacerbation: Secondary | ICD-10-CM | POA: Diagnosis not present

## 2020-03-24 DIAGNOSIS — K219 Gastro-esophageal reflux disease without esophagitis: Secondary | ICD-10-CM

## 2020-03-24 DIAGNOSIS — E1159 Type 2 diabetes mellitus with other circulatory complications: Secondary | ICD-10-CM

## 2020-03-24 DIAGNOSIS — R351 Nocturia: Secondary | ICD-10-CM | POA: Diagnosis not present

## 2020-03-24 DIAGNOSIS — E1140 Type 2 diabetes mellitus with diabetic neuropathy, unspecified: Secondary | ICD-10-CM

## 2020-03-24 DIAGNOSIS — D500 Iron deficiency anemia secondary to blood loss (chronic): Secondary | ICD-10-CM

## 2020-03-24 DIAGNOSIS — I10 Essential (primary) hypertension: Secondary | ICD-10-CM

## 2020-03-24 DIAGNOSIS — F3177 Bipolar disorder, in partial remission, most recent episode mixed: Secondary | ICD-10-CM

## 2020-03-24 DIAGNOSIS — F410 Panic disorder [episodic paroxysmal anxiety] without agoraphobia: Secondary | ICD-10-CM

## 2020-03-24 DIAGNOSIS — I152 Hypertension secondary to endocrine disorders: Secondary | ICD-10-CM

## 2020-03-24 DIAGNOSIS — Z794 Long term (current) use of insulin: Secondary | ICD-10-CM

## 2020-03-24 DIAGNOSIS — N401 Enlarged prostate with lower urinary tract symptoms: Secondary | ICD-10-CM

## 2020-03-24 NOTE — Chronic Care Management (AMB) (Signed)
Chronic Care Management    Clinical Social Work Follow Up Note  03/24/2020 Name: Petar Mucci MRN: [id number] DOB: Jan 16, 1961  Breanna Mcdaniel is a 59 y.o. year old male who is a primary care patient of Dettinger, Elige Radon, MD. The CCM team was consulted for assistance with [name]   Review of patient status, including review of consultants reports, other relevant assessments, and collaboration with appropriate care team members and the patient's provider was performed as part of comprehensive patient evaluation and provision of chronic care management services.    SDOH (Social Determinants of Health) assessments performed: Yes;risk for social isolation ; risk for tobacco use; risk for stress; risk for food insecurity; risk for physical inactivity   SDOH Interventions     Most Recent Value  SDOH Interventions  Depression Interventions/Treatment  Medication        Chronic Care Management from 03/24/2020 in [facility]  PHQ-9 Total Score 4     GAD 7 : Generalized Anxiety Score 03/24/2020  Nervous, Anxious, on Edge 1  Control/stop worrying 1  Worry too much - different things 1  Trouble relaxing 0  Restless 1  Easily annoyed or irritable 0  Afraid - awful might happen 0  Total GAD 7 Score 4  Anxiety Difficulty Somewhat difficult    Outpatient Encounter Medications as of 03/24/2020  Medication Sig  . Accu-Chek FastClix Lancets MISC CHECH GLUCOSE TWICE DAILY AS NEEDED  . albuterol (VENTOLIN HFA) 108 (90 Base) MCG/ACT inhaler Inhale 2 puffs into the lungs every 6 (six) hours as needed for wheezing or shortness of breath. Patient wants blue or red ventolin  . ALPRAZolam (XANAX) 0.5 MG tablet Take 1 tablet (0.5 mg total) by mouth at bedtime as needed for anxiety.  [name] aspirin EC 81 MG tablet Take 81 mg by mouth daily.  . DULoxetine (CYMBALTA) 60 MG capsule Take 2 capsules (120 mg total) by mouth daily.  . empagliflozin (JARDIANCE) 10 MG TABS tablet Take  10 mg by mouth daily.  [name] gabapentin (NEURONTIN) 300 MG capsule TAKE 1 CAPSULE(300 MG) BY MOUTH TWICE DAILY  . gemfibrozil (LOPID) 600 MG tablet Take 1 tablet (600 mg total) by mouth 2 (two) times daily before a meal.  . glucose blood (ACCU-CHEK GUIDE) test strip USE TO TEST GLUCOSE TWICE DAILY AND AS NEEDED  . HYDROcodone-acetaminophen (NORCO/VICODIN) 5-325 MG tablet Take 1 tablet by mouth every 4 (four) hours as needed for moderate pain.  [name] insulin glargine (LANTUS) 100 UNIT/ML injection ADMINISTER 50 UNITS UNDER THE SKIN AT BEDTIME  . insulin regular (HUMULIN R) 100 units/mL injection ADMINISTER 6 TO 10 UNITS UNDER THE SKIN TWICE DAILY BEFORE A MEAL PER SLIDING SCALE  . Insulin Syringe-Needle U-100 (BD INSULIN SYRINGE U/F) 31G X 5/16" 0.5 ML MISC 1 each by Does not apply route 4 (four) times daily.  [name] levothyroxine (SYNTHROID) 50 MCG tablet TAKE 1 TABLET BY MOUTH EVERY DAY BEFORE BREAKFAST  . lidocaine (LIDODERM) 5 % Place 1 patch onto the skin daily. Remove & Discard patch within 12 hours or as directed by MD  . lisinopril (ZESTRIL) 5 MG tablet Take 1 tablet (5 mg total) by mouth daily.  . metFORMIN (GLUCOPHAGE) 1000 MG tablet Take 1 tablet (1,000 mg total) by mouth 2 (two) times daily with a meal.  . methocarbamol (ROBAXIN) 500 MG tablet Take 1 tablet (500 mg total) by mouth 2 (two) times daily.  . mirtazapine (REMERON) 45 MG tablet TAKE 1 TABLET(45 MG) BY MOUTH  AT BEDTIME  . oxyCODONE-acetaminophen (PERCOCET/ROXICET) 5-325 MG tablet Take 1 tablet by mouth every 8 (eight) hours as needed for moderate pain or severe pain.  [name] oxyCODONE-acetaminophen (PERCOCET/ROXICET) 5-325 MG tablet Take 1 tablet by mouth every 8 (eight) hours as needed for severe pain.  [name] oxyCODONE-acetaminophen (PERCOCET/ROXICET) 5-325 MG tablet Take 1 tablet by mouth every 8 (eight) hours as needed for severe pain.  . pantoprazole (PROTONIX) 40 MG tablet TAKE 1 TABLET BY MOUTH TWICE DAILY BEFORE A MEAL  . tamsulosin (FLOMAX)  0.4 MG CAPS capsule Take 1 capsule (0.4 mg total) by mouth daily.   No facility-administered encounter medications on file as of 03/24/2020.     Goals Addressed              This Visit's Progress   .  Client will talk with LCSW in next 30 days about health issues of client and about client completion of ADLs (pt-stated)        CARE PLAN ENTRY     Current Barriers:  . Patient with Chronic Diagnoses of Type 2 DM, HTN, Bipolar Disorder, BPH, Panic disorder, GERD, Anemia  Clinical Social Work Clinical Goal(s):  [name] LCSW will call client in next 30 days to discuss health issues of client and to discuss client completion of ADLs  Interventions: . Talked with [name] of client, about CCM program services . Talked with daughter about health needs of client . Talked with daughter about pain issues of client . Talked with daughter about ambulation of client . Talked with daughter about recent falls of client . Talked with daughter about vision challenges of client (wears glasses) . Talked with daughter about sleeping issues of client . Talked with daughter about insurance coverage of client. She said he has recenlty switched over to [employer]  . Talked with daughter about transport needs of client . Talked with daughter about client scheduled medical procedure at hospital today  [facility]) . Collaborated with [facility] Triage Nurse , Mariam Dollar, about nursing needs of client  Patient Self Care Activities:   Eats with set up assistance  Patient Self Care Deficits:   . Pain issues . Mobility challenges  Initial goal documentation        Follow Up Plan: LCSW to call client or Jibran Crookshanks in next 4 weeks to discuss needs of client at that time  [name].Klarissa Mcilvain MSW, LCSW Licensed Clinical Social Worker [facility] Care Management [phone]

## 2020-03-24 NOTE — Patient Instructions (Addendum)
Licensed Clinical Social Worker Visit Information  Goals we discussed today:  Goals Addressed              This Visit's Progress   .  Client will talk with LCSW in next 30 days about health issues of client and about client completion of ADLs (pt-stated)        CARE PLAN ENTRY     Current Barriers:  . Patient with Chronic Diagnoses of Type 2 DM, HTN, Bipolar Disorder, BPH, Panic disorder, GERD, Anemia  Clinical Social Work Clinical Goal(s):  [name] LCSW will call client in next 30 days to discuss health issues of client and to discuss client completion of ADLs  Interventions: . Talked with [name] of client, about CCM program services . Talked with daughter about health needs of client . Talked with daughter about pain issues of client . Talked with daughter about ambulation of client . Talked with daughter about recent falls of client . Talked with daughter about vision challenges of client (wears glasses) . Talked with daughter about sleeping issues of client . Talked with daughter about insurance coverage of client. She said he has recenlty switched over to [employer]  . Talked with daughter about transport needs of client . Talked with daughter about client scheduled medical procedure at hospital today  [facility]) . Collaborated with [facility] Triage Nurse , Mariam Dollar, about nursing needs of client  Patient Self Care Activities:   Eats with set up assistance  Patient Self Care Deficits:   . Pain issues . Mobility challenges  Initial goal documentation         Materials Provided: No   Follow Up Plan: LCSW to call client or Jacob Bautista in next 4 weeks to discuss needs of client at that time  The patient Jacob Bautista, daughter,verbalized understanding of instructions provided today and declined a print copy of patient instruction materials.   [name].[name] MSW, LCSW Licensed Clinical Social Worker [facility] Care Management [phone]

## 2020-03-25 DIAGNOSIS — L02414 Cutaneous abscess of left upper limb: Secondary | ICD-10-CM | POA: Insufficient documentation

## 2020-03-28 DIAGNOSIS — B9561 Methicillin susceptible Staphylococcus aureus infection as the cause of diseases classified elsewhere: Secondary | ICD-10-CM | POA: Diagnosis not present

## 2020-03-28 DIAGNOSIS — R7881 Bacteremia: Secondary | ICD-10-CM | POA: Diagnosis not present

## 2020-03-28 DIAGNOSIS — L02414 Cutaneous abscess of left upper limb: Secondary | ICD-10-CM | POA: Diagnosis not present

## 2020-03-29 DIAGNOSIS — R7881 Bacteremia: Secondary | ICD-10-CM | POA: Diagnosis not present

## 2020-03-29 DIAGNOSIS — L02414 Cutaneous abscess of left upper limb: Secondary | ICD-10-CM | POA: Diagnosis not present

## 2020-03-29 DIAGNOSIS — B9561 Methicillin susceptible Staphylococcus aureus infection as the cause of diseases classified elsewhere: Secondary | ICD-10-CM | POA: Diagnosis not present

## 2020-03-30 ENCOUNTER — Encounter: Payer: Self-pay | Admitting: Family Medicine

## 2020-03-30 ENCOUNTER — Ambulatory Visit (INDEPENDENT_AMBULATORY_CARE_PROVIDER_SITE_OTHER): Payer: Medicare Other | Admitting: Family Medicine

## 2020-03-30 ENCOUNTER — Other Ambulatory Visit: Payer: Self-pay

## 2020-03-30 VITALS — BP 121/88 | HR 111 | Temp 96.4°F | Ht 72.0 in | Wt 148.0 lb

## 2020-03-30 DIAGNOSIS — Z794 Long term (current) use of insulin: Secondary | ICD-10-CM

## 2020-03-30 DIAGNOSIS — B9561 Methicillin susceptible Staphylococcus aureus infection as the cause of diseases classified elsewhere: Secondary | ICD-10-CM | POA: Diagnosis not present

## 2020-03-30 DIAGNOSIS — L02414 Cutaneous abscess of left upper limb: Secondary | ICD-10-CM | POA: Diagnosis not present

## 2020-03-30 DIAGNOSIS — L0291 Cutaneous abscess, unspecified: Secondary | ICD-10-CM

## 2020-03-30 DIAGNOSIS — B182 Chronic viral hepatitis C: Secondary | ICD-10-CM | POA: Diagnosis not present

## 2020-03-30 DIAGNOSIS — E1140 Type 2 diabetes mellitus with diabetic neuropathy, unspecified: Secondary | ICD-10-CM | POA: Diagnosis not present

## 2020-03-30 DIAGNOSIS — R7881 Bacteremia: Secondary | ICD-10-CM | POA: Diagnosis not present

## 2020-03-30 DIAGNOSIS — E1110 Type 2 diabetes mellitus with ketoacidosis without coma: Secondary | ICD-10-CM

## 2020-03-30 LAB — BAYER DCA HB A1C WAIVED: HB A1C (BAYER DCA - WAIVED): 10.3 % — ABNORMAL HIGH (ref <7.0)

## 2020-03-30 NOTE — Progress Notes (Signed)
BP (!) 121/88   Pulse (!) 111   Temp (!) 96.4 F (35.8 C)   Ht 6' (1.829 m)   Wt 148 lb (67.1 kg)   SpO2 100%   BMI 20.07 kg/m    Subjective:   Patient ID: Jacob Bautista, male    DOB: 09/10/1960, 59 y.o.   MRN: [id number]  HPI: Jacob Bautista is a 59 y.o. male presenting on 03/30/2020 for Hospitalization Follow-up   HPI Large complicated abscess and DKA hospital follow-up Patient was admitted on 03/10/2020 and discharged on 03/20/2020.  He went in with shoulder pain and abscess and was also found to be DKA.  His blood sugars were elevated.  They open up the wound and surgically drained it but said it was very deep and they packed it and it is draining still an open.  Patient is still having some acute confusion off and on, is his daughter here with him and he is in the changes.  Patient denies any fevers or chills.  His mental state comes off and on.  His blood sugars are still running instructed him to increase blood sugars.  Relevant past medical, surgical, family and social history reviewed and updated as indicated. Interim medical history since our last visit reviewed. Allergies and medications reviewed and updated.  Review of Systems  Constitutional: Negative for chills and fever.  Respiratory: Negative for shortness of breath and wheezing.   Cardiovascular: Negative for chest pain and leg swelling.  Musculoskeletal: Negative for back pain and gait problem.  Skin: Positive for color change and wound. Negative for rash.  All other systems reviewed and are negative.   Per HPI unless specifically indicated above   Allergies as of 03/30/2020   No Known Allergies     Medication List       Accurate as of March 30, 2020 11:07 AM. If you have any questions, ask your nurse or doctor.        STOP taking these medications   ALPRAZolam 0.5 MG tablet Commonly known as: Duanne Moron Stopped by: Fransisca Kaufmann Shaye Lagace, MD   HYDROcodone-acetaminophen 5-325 MG tablet Commonly known as:  NORCO/VICODIN Stopped by: Worthy Rancher, MD   oxyCODONE-acetaminophen 5-325 MG tablet Commonly known as: PERCOCET/ROXICET Stopped by: Fransisca Kaufmann Manasseh Pittsley, MD     TAKE these medications   Accu-Chek FastClix Lancets Misc CHECH GLUCOSE TWICE DAILY AS NEEDED   Accu-Chek Guide test strip Generic drug: glucose blood USE TO TEST GLUCOSE TWICE DAILY AND AS NEEDED   albuterol 108 (90 Base) MCG/ACT inhaler Commonly known as: VENTOLIN HFA Inhale 2 puffs into the lungs every 6 (six) hours as needed for wheezing or shortness of breath. Patient wants blue or red ventolin   aspirin EC 81 MG tablet Take 81 mg by mouth daily.   DULoxetine 60 MG capsule Commonly known as: Cymbalta Take 2 capsules (120 mg total) by mouth daily.   gabapentin 300 MG capsule Commonly known as: NEURONTIN TAKE 1 CAPSULE(300 MG) BY MOUTH TWICE DAILY   gemfibrozil 600 MG tablet Commonly known as: LOPID Take 1 tablet (600 mg total) by mouth 2 (two) times daily before a meal.   insulin glargine 100 UNIT/ML injection Commonly known as: Lantus ADMINISTER 50 UNITS UNDER THE SKIN AT BEDTIME   insulin regular 100 units/mL injection Commonly known as: HumuLIN R ADMINISTER 6 TO 10 UNITS UNDER THE SKIN TWICE DAILY BEFORE A MEAL PER SLIDING SCALE   Insulin Syringe-Needle U-100 31G X 5/16" 0.5 ML Misc Commonly known  as: BD Insulin Syringe U/F 1 each by Does not apply route 4 (four) times daily.   Jardiance 10 MG Tabs tablet Generic drug: empagliflozin Take 10 mg by mouth daily.   levothyroxine 50 MCG tablet Commonly known as: SYNTHROID TAKE 1 TABLET BY MOUTH EVERY DAY BEFORE BREAKFAST   lidocaine 5 % Commonly known as: Lidoderm Place 1 patch onto the skin daily. Remove & Discard patch within 12 hours or as directed by MD   lisinopril 5 MG tablet Commonly known as: ZESTRIL Take 1 tablet (5 mg total) by mouth daily.   metFORMIN 1000 MG tablet Commonly known as: GLUCOPHAGE Take 1 tablet (1,000 mg total)  by mouth 2 (two) times daily with a meal.   methocarbamol 500 MG tablet Commonly known as: ROBAXIN Take 1 tablet (500 mg total) by mouth 2 (two) times daily.   mirtazapine 45 MG tablet Commonly known as: REMERON TAKE 1 TABLET(45 MG) BY MOUTH AT BEDTIME   pantoprazole 40 MG tablet Commonly known as: PROTONIX TAKE 1 TABLET BY MOUTH TWICE DAILY BEFORE A MEAL   tamsulosin 0.4 MG Caps capsule Commonly known as: FLOMAX Take 1 capsule (0.4 mg total) by mouth daily.            Durable Medical Equipment  (From admission, onward)         Start     Ordered   03/30/20 0000  For home use only DME Other see comment       Comments: Patient needs enough supplies for twice a day change of three 4 inch square gauze +2 Tegaderm per change plus clean gloves.  He needs enough supplies to change twice a day, give a 1 month supply of this. Diagnosis large complicated abscess  Question:  Length of Need  Answer:  6 Months   03/30/20 1106           Objective:   BP (!) 121/88   Pulse (!) 111   Temp (!) 96.4 F (35.8 C)   Ht 6' (1.829 m)   Wt 148 lb (67.1 kg)   SpO2 100%   BMI 20.07 kg/m   Wt Readings from Last 3 Encounters:  03/30/20 148 lb (67.1 kg)  03/10/20 160 lb (72.6 kg)  11/15/19 171 lb 6 oz (77.7 kg)    Physical Exam Vitals and nursing note reviewed.  Constitutional:      General: He is not in acute distress.    Appearance: He is well-developed. He is not diaphoretic.  Eyes:     General: No scleral icterus.    Conjunctiva/sclera: Conjunctivae normal.  Neck:     Thyroid: No thyromegaly.  Musculoskeletal:     Cervical back: Neck supple.  Lymphadenopathy:     Cervical: No cervical adenopathy.  Skin:    General: Skin is warm and dry.     Findings: Wound (Patient has large draining wound, really did the packing and it is at least 6 inches deep that I can get the packing into) present. No rash.  Neurological:     Mental Status: He is alert and oriented to person,  place, and time.     Coordination: Coordination normal.  Psychiatric:        Behavior: Behavior normal.     Repacked wound with 10 inches of quarter inch iodoform gauze, open and draining, purulent drainage, patient has PICC line intact with getting home IV antibiotics.  Assessment & Plan:   Problem List Items Addressed This Visit  Digestive   Chronic hepatitis C without hepatic coma ([place])     Endocrine   Type 2 diabetes mellitus with diabetic neuropathy, unspecified ([place])   Relevant Orders   Bayer DCA Hb A1c Waived    Other Visit Diagnoses    Diabetic ketoacidosis without coma associated with type 2 diabetes mellitus ([place])    -  Primary   Relevant Orders   CBC with Differential/Platelet   CMP14+EGFR   Ambulatory referral to [place]   Abscess       Relevant Orders   CBC with Differential/Platelet   CMP14+EGFR   Ambulatory referral to [place]   For home use only DME Other see comment      Repacked the wound, will follow up in 1 to 2 weeks, will try for home health care. Follow up plan: Return in about 1 week (around 04/06/2020), or if symptoms worsen or fail to improve, for Patient needs 1 week wound care check if he does not get home health.  Counseling provided for all of the vaccine components Orders Placed This Encounter  Procedures  . For home use only DME Other see comment  . Bayer DCA Hb A1c Waived  . CBC with Differential/Platelet  . CMP14+EGFR  . Ambulatory referral to [place], MD [facility] 03/30/2020, 11:07 AM

## 2020-03-31 ENCOUNTER — Telehealth: Payer: Self-pay | Admitting: *Deleted

## 2020-03-31 DIAGNOSIS — R7881 Bacteremia: Secondary | ICD-10-CM | POA: Diagnosis not present

## 2020-03-31 DIAGNOSIS — L02414 Cutaneous abscess of left upper limb: Secondary | ICD-10-CM | POA: Diagnosis not present

## 2020-03-31 DIAGNOSIS — B9561 Methicillin susceptible Staphylococcus aureus infection as the cause of diseases classified elsewhere: Secondary | ICD-10-CM | POA: Diagnosis not present

## 2020-03-31 LAB — CMP14+EGFR
ALT: 15 IU/L (ref 0–44)
AST: 24 IU/L (ref 0–40)
Albumin/Globulin Ratio: 0.7 — ABNORMAL LOW (ref 1.2–2.2)
Albumin: 3.3 g/dL — ABNORMAL LOW (ref 3.8–4.9)
Alkaline Phosphatase: 259 IU/L — ABNORMAL HIGH (ref 48–121)
BUN/Creatinine Ratio: 22 — ABNORMAL HIGH (ref 9–20)
BUN: 15 mg/dL (ref 6–24)
Bilirubin Total: <0.2 mg/dL (ref 0.0–1.2)
CO2: 23 mmol/L (ref 20–29)
Calcium: 9.8 mg/dL (ref 8.7–10.2)
Chloride: 100 mmol/L (ref 96–106)
Creatinine, Ser: 0.68 mg/dL — ABNORMAL LOW (ref 0.76–1.27)
GFR calc Af Amer: 121 mL/min/{1.73_m2} (ref >59)
GFR calc non Af Amer: 105 mL/min/{1.73_m2} (ref >59)
Globulin, Total: 4.6 g/dL — ABNORMAL HIGH (ref 1.5–4.5)
Glucose: 272 mg/dL — ABNORMAL HIGH (ref 65–99)
Potassium: 4.7 mmol/L (ref 3.5–5.2)
Sodium: 138 mmol/L (ref 134–144)
Total Protein: 7.9 g/dL (ref 6.0–8.5)

## 2020-03-31 LAB — CBC WITH DIFFERENTIAL/PLATELET
Basophils Absolute: 0 10*3/uL (ref 0.0–0.2)
Basos: 1 %
EOS (ABSOLUTE): 0.1 10*3/uL (ref 0.0–0.4)
Eos: 1 %
Hematocrit: 37.9 % (ref 37.5–51.0)
Hemoglobin: 12 g/dL — ABNORMAL LOW (ref 13.0–17.7)
Immature Grans (Abs): 0 10*3/uL (ref 0.0–0.1)
Immature Granulocytes: 0 %
Lymphocytes Absolute: 1.9 10*3/uL (ref 0.7–3.1)
Lymphs: 29 %
MCH: 23.7 pg — ABNORMAL LOW (ref 26.6–33.0)
MCHC: 31.7 g/dL (ref 31.5–35.7)
MCV: 75 fL — ABNORMAL LOW (ref 79–97)
Monocytes Absolute: 0.6 10*3/uL (ref 0.1–0.9)
Monocytes: 10 %
Neutrophils Absolute: 3.9 10*3/uL (ref 1.4–7.0)
Neutrophils: 59 %
Platelets: 521 10*3/uL — ABNORMAL HIGH (ref 150–450)
RBC: 5.07 x10E6/uL (ref 4.14–5.80)
RDW: 18.4 % — ABNORMAL HIGH (ref 11.6–15.4)
WBC: 6.6 10*3/uL (ref 3.4–10.8)

## 2020-04-01 DIAGNOSIS — L02414 Cutaneous abscess of left upper limb: Secondary | ICD-10-CM | POA: Diagnosis not present

## 2020-04-01 DIAGNOSIS — R7881 Bacteremia: Secondary | ICD-10-CM | POA: Diagnosis not present

## 2020-04-01 DIAGNOSIS — B9561 Methicillin susceptible Staphylococcus aureus infection as the cause of diseases classified elsewhere: Secondary | ICD-10-CM | POA: Diagnosis not present

## 2020-04-02 ENCOUNTER — Other Ambulatory Visit: Payer: Self-pay | Admitting: Family Medicine

## 2020-04-02 DIAGNOSIS — Z794 Long term (current) use of insulin: Secondary | ICD-10-CM

## 2020-04-02 DIAGNOSIS — R351 Nocturia: Secondary | ICD-10-CM

## 2020-04-02 DIAGNOSIS — I10 Essential (primary) hypertension: Secondary | ICD-10-CM

## 2020-04-02 DIAGNOSIS — E1140 Type 2 diabetes mellitus with diabetic neuropathy, unspecified: Secondary | ICD-10-CM

## 2020-04-02 DIAGNOSIS — F410 Panic disorder [episodic paroxysmal anxiety] without agoraphobia: Secondary | ICD-10-CM

## 2020-04-02 DIAGNOSIS — R7881 Bacteremia: Secondary | ICD-10-CM | POA: Diagnosis not present

## 2020-04-02 DIAGNOSIS — L02414 Cutaneous abscess of left upper limb: Secondary | ICD-10-CM | POA: Diagnosis not present

## 2020-04-02 DIAGNOSIS — E785 Hyperlipidemia, unspecified: Secondary | ICD-10-CM

## 2020-04-02 DIAGNOSIS — B9561 Methicillin susceptible Staphylococcus aureus infection as the cause of diseases classified elsewhere: Secondary | ICD-10-CM | POA: Diagnosis not present

## 2020-04-03 DIAGNOSIS — L02414 Cutaneous abscess of left upper limb: Secondary | ICD-10-CM | POA: Diagnosis not present

## 2020-04-03 DIAGNOSIS — R7881 Bacteremia: Secondary | ICD-10-CM | POA: Diagnosis not present

## 2020-04-03 DIAGNOSIS — B9561 Methicillin susceptible Staphylococcus aureus infection as the cause of diseases classified elsewhere: Secondary | ICD-10-CM | POA: Diagnosis not present

## 2020-04-03 NOTE — Telephone Encounter (Signed)
VO given for CBC w/ Diff & BMP weekly until infusions are done

## 2020-04-03 NOTE — Telephone Encounter (Signed)
Pt getting home infusions, need to get lab orders weekly while having infusions done. Would like to get CBC & BMP Can fax order to [facility] at [phone]

## 2020-04-03 NOTE — Telephone Encounter (Signed)
Okay to give this order

## 2020-04-04 DIAGNOSIS — L02414 Cutaneous abscess of left upper limb: Secondary | ICD-10-CM | POA: Diagnosis not present

## 2020-04-04 DIAGNOSIS — B9561 Methicillin susceptible Staphylococcus aureus infection as the cause of diseases classified elsewhere: Secondary | ICD-10-CM | POA: Diagnosis not present

## 2020-04-04 DIAGNOSIS — R7881 Bacteremia: Secondary | ICD-10-CM | POA: Diagnosis not present

## 2020-04-05 DIAGNOSIS — L02414 Cutaneous abscess of left upper limb: Secondary | ICD-10-CM | POA: Diagnosis not present

## 2020-04-05 DIAGNOSIS — R7881 Bacteremia: Secondary | ICD-10-CM | POA: Diagnosis not present

## 2020-04-05 DIAGNOSIS — B9561 Methicillin susceptible Staphylococcus aureus infection as the cause of diseases classified elsewhere: Secondary | ICD-10-CM | POA: Diagnosis not present

## 2020-04-06 DIAGNOSIS — R7881 Bacteremia: Secondary | ICD-10-CM | POA: Diagnosis not present

## 2020-04-06 DIAGNOSIS — B9561 Methicillin susceptible Staphylococcus aureus infection as the cause of diseases classified elsewhere: Secondary | ICD-10-CM | POA: Diagnosis not present

## 2020-04-06 DIAGNOSIS — L02414 Cutaneous abscess of left upper limb: Secondary | ICD-10-CM | POA: Diagnosis not present

## 2020-04-07 DIAGNOSIS — L02414 Cutaneous abscess of left upper limb: Secondary | ICD-10-CM | POA: Diagnosis not present

## 2020-04-07 DIAGNOSIS — R7881 Bacteremia: Secondary | ICD-10-CM | POA: Diagnosis not present

## 2020-04-07 DIAGNOSIS — B9561 Methicillin susceptible Staphylococcus aureus infection as the cause of diseases classified elsewhere: Secondary | ICD-10-CM | POA: Diagnosis not present

## 2020-04-08 DIAGNOSIS — B9561 Methicillin susceptible Staphylococcus aureus infection as the cause of diseases classified elsewhere: Secondary | ICD-10-CM | POA: Diagnosis not present

## 2020-04-08 DIAGNOSIS — R7881 Bacteremia: Secondary | ICD-10-CM | POA: Diagnosis not present

## 2020-04-08 DIAGNOSIS — L02414 Cutaneous abscess of left upper limb: Secondary | ICD-10-CM | POA: Diagnosis not present

## 2020-04-09 DIAGNOSIS — R7881 Bacteremia: Secondary | ICD-10-CM | POA: Diagnosis not present

## 2020-04-09 DIAGNOSIS — B9561 Methicillin susceptible Staphylococcus aureus infection as the cause of diseases classified elsewhere: Secondary | ICD-10-CM | POA: Diagnosis not present

## 2020-04-09 DIAGNOSIS — L02414 Cutaneous abscess of left upper limb: Secondary | ICD-10-CM | POA: Diagnosis not present

## 2020-04-10 DIAGNOSIS — B9561 Methicillin susceptible Staphylococcus aureus infection as the cause of diseases classified elsewhere: Secondary | ICD-10-CM | POA: Diagnosis not present

## 2020-04-10 DIAGNOSIS — L02414 Cutaneous abscess of left upper limb: Secondary | ICD-10-CM | POA: Diagnosis not present

## 2020-04-10 DIAGNOSIS — R7881 Bacteremia: Secondary | ICD-10-CM | POA: Diagnosis not present

## 2020-04-12 DIAGNOSIS — Z7982 Long term (current) use of aspirin: Secondary | ICD-10-CM | POA: Diagnosis not present

## 2020-04-12 DIAGNOSIS — B958 Unspecified staphylococcus as the cause of diseases classified elsewhere: Secondary | ICD-10-CM | POA: Diagnosis not present

## 2020-04-12 DIAGNOSIS — E119 Type 2 diabetes mellitus without complications: Secondary | ICD-10-CM | POA: Diagnosis not present

## 2020-04-12 DIAGNOSIS — M00012 Staphylococcal arthritis, left shoulder: Secondary | ICD-10-CM | POA: Diagnosis not present

## 2020-04-12 DIAGNOSIS — S41002D Unspecified open wound of left shoulder, subsequent encounter: Secondary | ICD-10-CM | POA: Diagnosis not present

## 2020-04-12 DIAGNOSIS — B9561 Methicillin susceptible Staphylococcus aureus infection as the cause of diseases classified elsewhere: Secondary | ICD-10-CM | POA: Diagnosis not present

## 2020-04-12 DIAGNOSIS — I10 Essential (primary) hypertension: Secondary | ICD-10-CM | POA: Diagnosis not present

## 2020-04-12 DIAGNOSIS — S41002A Unspecified open wound of left shoulder, initial encounter: Secondary | ICD-10-CM | POA: Diagnosis not present

## 2020-04-12 DIAGNOSIS — Z79899 Other long term (current) drug therapy: Secondary | ICD-10-CM | POA: Diagnosis not present

## 2020-04-12 DIAGNOSIS — R7881 Bacteremia: Secondary | ICD-10-CM | POA: Diagnosis not present

## 2020-04-12 DIAGNOSIS — Z794 Long term (current) use of insulin: Secondary | ICD-10-CM | POA: Diagnosis not present

## 2020-04-14 ENCOUNTER — Ambulatory Visit (INDEPENDENT_AMBULATORY_CARE_PROVIDER_SITE_OTHER): Payer: Medicare Other | Admitting: Family Medicine

## 2020-04-14 ENCOUNTER — Ambulatory Visit: Payer: Medicare Other | Admitting: Family Medicine

## 2020-04-14 ENCOUNTER — Encounter: Payer: Self-pay | Admitting: Family Medicine

## 2020-04-14 ENCOUNTER — Telehealth: Payer: Self-pay | Admitting: *Deleted

## 2020-04-14 ENCOUNTER — Other Ambulatory Visit: Payer: Self-pay

## 2020-04-14 ENCOUNTER — Telehealth: Payer: Self-pay | Admitting: Family Medicine

## 2020-04-14 VITALS — BP 126/84 | HR 112 | Temp 97.8°F | Ht 73.0 in | Wt 153.2 lb

## 2020-04-14 DIAGNOSIS — S41002D Unspecified open wound of left shoulder, subsequent encounter: Secondary | ICD-10-CM | POA: Diagnosis not present

## 2020-04-14 DIAGNOSIS — L0291 Cutaneous abscess, unspecified: Secondary | ICD-10-CM

## 2020-04-14 NOTE — Telephone Encounter (Signed)
Corrie Dandy called from [facility] stating that patient finished his IV ancef on Monday 04/10/20 and is needing order with approval to pull it.

## 2020-04-14 NOTE — Chronic Care Management (AMB) (Signed)
°  Chronic Care Management   Note  04/14/2020 Name: [name] MRN: [id number] DOB: 1960/11/22  Jacob Bautista is a 59 y.o. year old male who is a primary care patient of Dettinger, Elige Radon, MD and is actively engaged with the care management team. I reached out to Candiss Norse by phone today to assist with re-scheduling a follow up visit with the Licensed Clinical Child psychotherapist.  Follow up plan: Telephone appointment with care management team member scheduled for: 05/23/2020  [facility] Guide, Embedded Care Coordination [facility]  [place], [place] [address] Direct Dial: [phone] [email or web address] Website: [email or web address]

## 2020-04-14 NOTE — Telephone Encounter (Signed)
Spoke with Corrie Dandy and gave VO to remove PICC line.

## 2020-04-14 NOTE — Telephone Encounter (Signed)
Okay to go ahead and remove the PICC line

## 2020-04-14 NOTE — Progress Notes (Signed)
   BP 126/84   Pulse (!) 112   Temp 97.8 F (36.6 C) (Temporal)   Ht 6\' 1"  (1.854 m)   Wt 153 lb 3.2 oz (69.5 kg)   SpO2 97%   BMI 20.21 kg/m    Subjective:   Patient ID: Easter Schinke, male    DOB: [birth year], 59 y.o.   MRN: [id number]  HPI: Jarreau Callanan is a 59 y.o. male presenting on 04/14/2020 for Wound Check (left shoulder )   HPI Patient is coming in today for left shoulder wound recheck.  He is still has the packing in there and has been living solely.  He did go see the surgeon and they did remove and replace the packing.  They have been monitoring it at home closely as well.  The surgeon did recommend a CT scan of his shoulder to help look to make sure it looks depth.  Relevant past medical, surgical, family and social history reviewed and updated as indicated. Interim medical history since our last visit reviewed. Allergies and medications reviewed and updated.  Review of Systems  Constitutional: Negative for chills and fever.  Respiratory: Negative for shortness of breath and wheezing.   Cardiovascular: Negative for chest pain and leg swelling.  Skin: Positive for wound.  All other systems reviewed and are negative.   Per HPI unless specifically indicated above      Objective:   BP 126/84   Pulse (!) 112   Temp 97.8 F (36.6 C) (Temporal)   Ht 6\' 1"  (1.854 m)   Wt 153 lb 3.2 oz (69.5 kg)   SpO2 97%   BMI 20.21 kg/m   Wt Readings from Last 3 Encounters:  04/14/20 153 lb 3.2 oz (69.5 kg)  03/30/20 148 lb (67.1 kg)  03/10/20 160 lb (72.6 kg)    Physical Exam Vitals and nursing note reviewed.  Constitutional:      General: He is not in acute distress.    Appearance: He is well-developed. He is not diaphoretic.  Eyes:     General: No scleral icterus.    Conjunctiva/sclera: Conjunctivae normal.  Neck:     Thyroid: No thyromegaly.  Skin:    General: Skin is warm and dry.     Findings: Wound present. No rash.       Neurological:     Mental Status:  He is alert.       Assessment & Plan:   Problem List Items Addressed This Visit    None    Visit Diagnoses    Abscess    -  Primary   Relevant Orders   CT SHOULDER LEFT W CONTRAST   Open wound of left shoulder, subsequent encounter       Relevant Orders   CT SHOULDER LEFT W CONTRAST      Will order CT his shoulder, finished antibiotics, will get PICC line removed today. Follow up plan: Return if symptoms worsen or fail to improve, for Wound recheck, in 1 to 2 weeks.  Counseling provided for all of the vaccine components No orders of the defined types were placed in this encounter.   04/01/20, MD 05/11/20 Family Medicine 04/14/2020, 4:20 PM

## 2020-04-15 DIAGNOSIS — R7881 Bacteremia: Secondary | ICD-10-CM | POA: Diagnosis not present

## 2020-04-15 DIAGNOSIS — L02414 Cutaneous abscess of left upper limb: Secondary | ICD-10-CM | POA: Diagnosis not present

## 2020-04-15 DIAGNOSIS — B9561 Methicillin susceptible Staphylococcus aureus infection as the cause of diseases classified elsewhere: Secondary | ICD-10-CM | POA: Diagnosis not present

## 2020-04-25 ENCOUNTER — Ambulatory Visit: Payer: Medicare Other | Admitting: Nurse Practitioner

## 2020-04-27 ENCOUNTER — Encounter: Payer: Self-pay | Admitting: Family Medicine

## 2020-04-27 ENCOUNTER — Telehealth: Payer: Medicare Other

## 2020-04-28 ENCOUNTER — Other Ambulatory Visit: Payer: Self-pay | Admitting: Family Medicine

## 2020-05-03 ENCOUNTER — Ambulatory Visit: Payer: Medicare Other | Admitting: Family Medicine

## 2020-05-04 ENCOUNTER — Ambulatory Visit (INDEPENDENT_AMBULATORY_CARE_PROVIDER_SITE_OTHER): Payer: Medicare Other | Admitting: Nurse Practitioner

## 2020-05-04 ENCOUNTER — Other Ambulatory Visit: Payer: Self-pay

## 2020-05-04 ENCOUNTER — Encounter: Payer: Self-pay | Admitting: Nurse Practitioner

## 2020-05-04 VITALS — BP 95/64 | HR 105 | Temp 98.0°F | Ht 73.0 in | Wt 160.0 lb

## 2020-05-04 DIAGNOSIS — L0291 Cutaneous abscess, unspecified: Secondary | ICD-10-CM | POA: Insufficient documentation
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
## 2020-05-04 NOTE — Patient Instructions (Addendum)
Wound Infection A wound infection happens when germs start to grow in a wound. Germs that cause wound infections are most often bacteria. Other types of infections can occur as well. An infection can cause the wound to break open. Wound infections need treatment. If a wound infection is not treated, problems can happen. What are the causes?  Most often caused by germs (bacteria) that grow in a wound.  Other germs, such as yeast and funguses, can also cause wound infections. What increases the risk?  Having a weak body defense system (immune system).  Having diabetes.  Taking certain medicines (steroids) for a long time.  Smoking.  Being an older person.  Being overweight.  Taking certain medicines for cancer treatment. What are the signs or symptoms?  Having more redness, swelling, or pain at the wound site.  Having more blood or fluid at the wound site.  A bad smell coming from a wound or bandage (dressing).  Having a fever.  Feeling very tired.  Having warmth at or around the wound.  Having pus at the wound site. How is this treated?  This condition is most often treated with an antibiotic medicine. ? The infection should improve 24-48 hours after you start antibiotics. ? After 24-48 hours, redness around the wound should stop spreading. The wound should also be less painful. Follow these instructions at home: Medicines  Take or apply over-the-counter and prescription medicines only as told by your doctor.  If you were prescribed an antibiotic medicine, take or apply it as told by your doctor. Do not stop using the antibiotic even if you start to feel better. Wound care   Clean the wound each day, or as told by your doctor. ? Wash the wound with mild soap and water. ? Rinse the wound with water to remove all soap. ? Pat the wound dry with a clean towel. Do not rub it.  Follow instructions from your doctor about how to take care of your wound. Make sure  you: ? Wash your hands with soap and water before and after you change your bandage. If you cannot use soap and water, use hand sanitizer. ? Change your bandage as told by your doctor. ? Leave stitches (sutures), skin glue, or skin tape (adhesive) strips in place if your wound has been closed. They may need to stay in place for 2 weeks or longer. If tape strips get loose and curl up, you may trim the loose edges. Do not remove tape strips completely unless your doctor says it is okay. Some wounds are left open to heal on their own.  Check your wound every day for signs of infection. Watch for: ? More redness, swelling, or pain. ? More fluid or blood. ? Warmth. ? Pus or a bad smell. General instructions  Keep the bandage dry until your doctor says it can be removed.  Do not take baths, swim, or use a hot tub until your doctor approves. Ask your doctor if you may take showers. You may only be allowed to take sponge baths.  Raise (elevate) the injured area above the level of your heart while you are sitting or lying down.  Do not scratch or pick at the wound.  Keep all follow-up visits as told by your doctor. This is important. Contact a doctor if:  Medicine does not help your pain.  You have more redness, swelling, or pain around your wound.  You have more fluid or blood coming from your wound.    Your wound feels warm to the touch.  You have pus coming from your wound.  You notice a bad smell coming from your wound or your bandage.  Your wound that was closed breaks open. Get help right away if:  You have a red streak going away from your wound.  You have a fever. Summary  A wound infection happens when germs start to grow in a wound.  This condition is usually treated with an antibiotic medicine.  Follow instructions from your doctor about how to take care of your wound.  Contact a doctor if your wound infection does not start to get better in 24-48 hours, or your  symptoms get worse.  Keep all follow-up visits as told by your doctor. This is important. This information is not intended to replace advice given to you by your health care provider. Make sure you discuss any questions you have with your health care provider. Document Revised: 04/07/2018 Document Reviewed: 04/07/2018 Elsevier Patient Education  2020 [employer]  Skin Abscess  A skin abscess is an infected area on or under your skin that contains a collection of pus and other material. An abscess may also be called a furuncle, carbuncle, or boil. An abscess can occur in or on almost any part of your body. Some abscesses break open (rupture) on their own. Most continue to get worse unless they are treated. The infection can spread deeper into the body and eventually into your blood, which can make you feel ill. Treatment usually involves draining the abscess. What are the causes? An abscess occurs when germs, like bacteria, pass through your skin and cause an infection. This may be caused by:  A scrape or cut on your skin.  A puncture wound through your skin, including a needle injection or insect bite.  Blocked oil or sweat glands.  Blocked and infected hair follicles.  A cyst that forms beneath your skin (sebaceous cyst) and becomes infected. What increases the risk? This condition is more likely to develop in people who:  Have a weak body defense system (immune system).  Have diabetes.  Have dry and irritated skin.  Get frequent injections or use illegal IV drugs.  Have a foreign body in a wound, such as a splinter.  Have problems with their lymph system or veins. What are the signs or symptoms? Symptoms of this condition include:  A painful, firm bump under the skin.  A bump with pus at the top. This may break through the skin and drain. Other symptoms include:  Redness surrounding the abscess site.  Warmth.  Swelling of the lymph nodes (glands) near the  abscess.  Tenderness.  A sore on the skin. How is this diagnosed? This condition may be diagnosed based on:  A physical exam.  Your medical history.  A sample of pus. This may be used to find out what is causing the infection.  Blood tests.  Imaging tests, such as an ultrasound, CT scan, or MRI. How is this treated? A small abscess that drains on its own may not need treatment. Treatment for larger abscesses may include:  Moist heat or heat pack applied to the area several times a day.  A procedure to drain the abscess (incision and drainage).  Antibiotic medicines. For a severe abscess, you may first get antibiotics through an IV and then change to antibiotics by mouth. Follow these instructions at home: Medicines   Take over-the-counter and prescription medicines only as told by your health care  provider.  If you were prescribed an antibiotic medicine, take it as told by your health care provider. Do not stop taking the antibiotic even if you start to feel better. Abscess care   If you have an abscess that has not drained, apply heat to the affected area. Use the heat source that your health care provider recommends, such as a moist heat pack or a heating pad. ? Place a towel between your skin and the heat source. ? Leave the heat on for 20-30 minutes. ? Remove the heat if your skin turns bright red. This is especially important if you are unable to feel pain, heat, or cold. You may have a greater risk of getting burned.  Follow instructions from your health care provider about how to take care of your abscess. Make sure you: ? Cover the abscess with a bandage (dressing). ? Change your dressing or gauze as told by your health care provider. ? Wash your hands with soap and water before you change the dressing or gauze. If soap and water are not available, use hand sanitizer.  Check your abscess every day for signs of a worsening infection. Check for: ? More redness,  swelling, or pain. ? More fluid or blood. ? Warmth. ? More pus or a bad smell. General instructions  To avoid spreading the infection: ? Do not share personal care items, towels, or hot tubs with others. ? Avoid making skin contact with other people.  Keep all follow-up visits as told by your health care provider. This is important. Contact a health care provider if you have:  More redness, swelling, or pain around your abscess.  More fluid or blood coming from your abscess.  Warm skin around your abscess.  More pus or a bad smell coming from your abscess.  A fever.  Muscle aches.  Chills or a general ill feeling. Get help right away if you:  Have severe pain.  See red streaks on your skin spreading away from the abscess. Summary  A skin abscess is an infected area on or under your skin that contains a collection of pus and other material.  A small abscess that drains on its own may not need treatment.  Treatment for larger abscesses may include having a procedure to drain the abscess and taking an antibiotic. This information is not intended to replace advice given to you by your health care provider. Make sure you discuss any questions you have with your health care provider. Document Revised: 12/17/2018 Document Reviewed: 10/09/2017 Elsevier Patient Education  2020 [employer].

## 2020-05-04 NOTE — Assessment & Plan Note (Signed)
Patient is a 59 year old male who presents to clinic for wound check.  Patient was treated few months ago.  For wound abscess previous treatment included oral antibiotics and IV antibiotic.  Patient's wound has been gradually improving.  Patient has no fever, there has been no drainage from the wound, there is no redness present.  There is no swelling present and no pain.  Patient is able to move all with no pain.  Cleaned wound applied 2 x 2 and Tegaderm for protection.   Patient follow-up as needed.

## 2020-05-11 ENCOUNTER — Other Ambulatory Visit: Payer: Self-pay

## 2020-05-11 ENCOUNTER — Ambulatory Visit (HOSPITAL_COMMUNITY)
Admission: RE | Admit: 2020-05-11 | Discharge: 2020-05-11 | Disposition: A | Discharge status: Home / Self Care | Payer: Medicare Other | Source: Ambulatory Visit | Attending: Family Medicine | Admitting: Family Medicine

## 2020-05-11 DIAGNOSIS — S41002A Unspecified open wound of left shoulder, initial encounter: Secondary | ICD-10-CM | POA: Diagnosis not present

## 2020-05-11 DIAGNOSIS — S41002D Unspecified open wound of left shoulder, subsequent encounter: Secondary | ICD-10-CM | POA: Diagnosis not present

## 2020-05-11 DIAGNOSIS — M609 Myositis, unspecified: Secondary | ICD-10-CM | POA: Diagnosis not present

## 2020-05-11 DIAGNOSIS — L0291 Cutaneous abscess, unspecified: Secondary | ICD-10-CM | POA: Insufficient documentation

## 2020-05-11 LAB — POCT I-STAT CREATININE: Creatinine, Ser: 0.7 mg/dL (ref 0.61–1.24)

## 2020-05-11 MED ORDER — IOHEXOL 300 MG/ML  SOLN
75.0000 mL | Freq: Once | INTRAMUSCULAR | Status: AC | PRN
  Administered 2020-05-11: 75 mL via INTRAVENOUS

## 2020-05-13 ENCOUNTER — Other Ambulatory Visit: Payer: Self-pay | Admitting: Family Medicine

## 2020-05-13 DIAGNOSIS — Z794 Long term (current) use of insulin: Secondary | ICD-10-CM

## 2020-05-23 ENCOUNTER — Ambulatory Visit (INDEPENDENT_AMBULATORY_CARE_PROVIDER_SITE_OTHER): Payer: Medicare Other | Admitting: Licensed Clinical Social Worker

## 2020-05-23 DIAGNOSIS — E1159 Type 2 diabetes mellitus with other circulatory complications: Secondary | ICD-10-CM | POA: Diagnosis not present

## 2020-05-23 DIAGNOSIS — D500 Iron deficiency anemia secondary to blood loss (chronic): Secondary | ICD-10-CM | POA: Diagnosis not present

## 2020-05-23 DIAGNOSIS — N401 Enlarged prostate with lower urinary tract symptoms: Secondary | ICD-10-CM

## 2020-05-23 DIAGNOSIS — I10 Essential (primary) hypertension: Secondary | ICD-10-CM | POA: Diagnosis not present

## 2020-05-23 DIAGNOSIS — I152 Hypertension secondary to endocrine disorders: Secondary | ICD-10-CM

## 2020-05-23 DIAGNOSIS — R351 Nocturia: Secondary | ICD-10-CM | POA: Diagnosis not present

## 2020-05-23 DIAGNOSIS — K219 Gastro-esophageal reflux disease without esophagitis: Secondary | ICD-10-CM

## 2020-05-23 DIAGNOSIS — Z794 Long term (current) use of insulin: Secondary | ICD-10-CM

## 2020-05-23 DIAGNOSIS — F3177 Bipolar disorder, in partial remission, most recent episode mixed: Secondary | ICD-10-CM | POA: Diagnosis not present

## 2020-05-23 DIAGNOSIS — F410 Panic disorder [episodic paroxysmal anxiety] without agoraphobia: Secondary | ICD-10-CM

## 2020-05-23 DIAGNOSIS — E1140 Type 2 diabetes mellitus with diabetic neuropathy, unspecified: Secondary | ICD-10-CM

## 2020-05-23 NOTE — Chronic Care Management (AMB) (Signed)
Chronic Care Management    Clinical Social Work Follow Up Note  05/23/2020 Name: Jacob Bautista MRN: [id number] DOB: 1960-12-02  Jacob Bautista is a 59 y.o. year old male who is a primary care patient of Dettinger, Elige Radon, MD. The CCM team was consulted for assistance with [name]   Review of patient status, including review of consultants reports, other relevant assessments, and collaboration with appropriate care team members and the patient's provider was performed as part of comprehensive patient evaluation and provision of chronic care management services.    SDOH (Social Determinants of Health) assessments performed: No;risk for tobacco use; risk for depression; risk for physical inactivity; risk for stress    Office Visit from 05/04/2020 in [facility]  PHQ-9 Total Score 13     GAD 7 : Generalized Anxiety Score 03/30/2020 03/24/2020  Nervous, Anxious, on Edge 1 1  Control/stop worrying 1 1  Worry too much - different things 1 1  Trouble relaxing 0 0  Restless 1 1  Easily annoyed or irritable 0 0  Afraid - awful might happen 0 0  Total GAD 7 Score 4 4  Anxiety Difficulty - Somewhat difficult    Outpatient Encounter Medications as of 05/23/2020  Medication Sig  . Accu-Chek FastClix Lancets MISC CHECH GLUCOSE TWICE DAILY AS NEEDED  . albuterol (VENTOLIN HFA) 108 (90 Base) MCG/ACT inhaler USE 1-2 PUFFS EVERY 4 HOURS AS NEEDED FOR WHEEZING OR SHORTNESS OF BREATH  . albuterol (VENTOLIN HFA) 108 (90 Base) MCG/ACT inhaler USE 1-2 PUFFS EVERY 4 HOURS AS NEEDED FOR WHEEZING OR SHORTNESS OF BREATH  . aspirin EC 81 MG tablet Take 81 mg by mouth daily.  . DULoxetine (CYMBALTA) 60 MG capsule TAKE 2 CAPSULES BY MOUTH EVERY DAY  . gabapentin (NEURONTIN) 300 MG capsule TAKE 1 CAPSULE(300 MG) BY MOUTH TWICE DAILY  . gemfibrozil (LOPID) 600 MG tablet TAKE 1 TABLET(600 MG) BY MOUTH TWICE DAILY BEFORE A MEAL  . glucose blood (ACCU-CHEK GUIDE) test strip USE TO  TEST GLUCOSE TWICE DAILY AND AS NEEDED  . insulin glargine (LANTUS) 100 UNIT/ML injection ADMINISTER 50 UNITS UNDER THE SKIN AT BEDTIME  . insulin regular (HUMULIN R) 100 units/mL injection ADMINISTER 6 TO 10 UNITS UNDER THE SKIN TWICE DAILY BEFORE A MEAL PER SLIDING SCALE  . Insulin Syringe-Needle U-100 (B-D INS SYR ULTRAFINE 1CC/30G) 30G X 1/2" 1 ML MISC Use up to 4 syringes daily as needed Dx E11.40  . JARDIANCE 10 MG TABS tablet TAKE 1 TABLET BY MOUTH DAILY  . levothyroxine (SYNTHROID) 50 MCG tablet TAKE 1 TABLET BY MOUTH EVERY DAY BEFORE BREAKFAST  . lidocaine (LIDODERM) 5 % Place 1 patch onto the skin daily. Remove & Discard patch within 12 hours or as directed by MD  . lisinopril (ZESTRIL) 5 MG tablet TAKE 1 TABLET(5 MG) BY MOUTH DAILY  . metFORMIN (GLUCOPHAGE) 1000 MG tablet TAKE 1 TABLET(1000 MG) BY MOUTH TWICE DAILY WITH A MEAL  . methocarbamol (ROBAXIN) 500 MG tablet Take 1 tablet (500 mg total) by mouth 2 (two) times daily.  . mirtazapine (REMERON) 45 MG tablet TAKE 1 TABLET(45 MG) BY MOUTH AT BEDTIME  . pantoprazole (PROTONIX) 40 MG tablet TAKE 1 TABLET BY MOUTH TWICE DAILY BEFORE A MEAL  . tamsulosin (FLOMAX) 0.4 MG CAPS capsule TAKE 1 CAPSULE(0.4 MG) BY MOUTH DAILY   No facility-administered encounter medications on file as of 05/23/2020.    Goals    .  Client will talk with LCSW in next  30 days about health issues of client and about client completion of ADLs (pt-stated)      CARE PLAN ENTRY     Current Barriers:  . Patient with Chronic Diagnoses of Type 2 DM, HTN, Bipolar Disorder, BPH, Panic disorder, GERD, Anemia  Clinical Social Work Clinical Goal(s):  [name] LCSW will call client in next 30 days to discuss health issues of client and to discuss client completion of ADLs  Interventions: . Talked with Jacob Bautista of client, about CCM program services . Talked with daughter about health needs of client . Talked with daughter about pain issues of client . Talked  with daughter about ambulation of client . Talked with daughter about recent falls of client . Talked with daughter about vision challenges of client (wears glasses) . Talked with daughter about sleeping issues of client . Talked with daughter about insurance coverage of client. She said he has recenlty switched over to [employer]  . Talked with daughter about transport needs of client . Talked with daughter of client about Diabetes management of client . Encouraged daughter of client to call RNCM as needed for nursing support . Talked with daughter about skin integrity of client  Patient Self Care Activities:   Eats with set up assistance  Patient Self Care Deficits:   . Pain issues . Mobility challenges  Initial goal documentation    Follow Up Plan: LCSW to call client or Jacob Bautista in next 4 weeks to discuss needs of client at that time  [name].Nola Botkins MSW, LCSW Licensed Clinical Social Worker [facility] Care Management [phone]

## 2020-05-23 NOTE — Patient Instructions (Addendum)
Licensed Clinical Child psychotherapist Visit Information  Goals we discussed today:     Client will talk with LCSW in next 30 days about health issues of client and about client completion of ADLs (pt-stated)         CARE PLAN ENTRY     Current Barriers:   Patient with Chronic Diagnoses of Type 2 DM, HTN, Bipolar Disorder, BPH, Panic disorder, GERD, Anemia  Clinical Social Work Clinical Goal(s):   LCSW will call client in next 30 days to discuss health issues of client and to discuss client completion of ADLs  Interventions:  Talked with [name] of client, about CCM program services  Talked with daughter about health needs of client  Talked with daughter about pain issues of client  Talked with daughter about ambulation of client  Talked with daughter about recent falls of client  Talked with daughter about vision challenges of client (wears glasses)  Talked with daughter about sleeping issues of client  Talked with daughter about insurance coverage of client. She said he has recenlty switched over to [employer]   Talked with daughter about transport needs of client  Talked with daughter of client about Diabetes management of client  Encouraged daughter of client to call RNCM as needed for nursing support  Talked with daughter about skin integrity of client  Patient Self Care Activities:   Eats with set up assistance  Patient Self Care Deficits:    Pain issues  Mobility challenges  Initial goal documentation    Follow Up Plan: LCSW to call client or Jacob Bautista in next 4 weeks to discuss needs of client at that time  Materials Provided: No  The patient Jacob Bautista, daughter of patient, verbalized understanding of instructions provided today and declined a print copy of patient instruction materials.   [name].Jaidy Cottam MSW, LCSW Licensed Clinical Social Worker [facility] [phone]

## 2020-05-27 ENCOUNTER — Other Ambulatory Visit: Payer: Self-pay | Admitting: Family Medicine

## 2020-05-27 DIAGNOSIS — Z794 Long term (current) use of insulin: Secondary | ICD-10-CM

## 2020-06-23 ENCOUNTER — Ambulatory Visit (INDEPENDENT_AMBULATORY_CARE_PROVIDER_SITE_OTHER): Payer: Medicare Other | Admitting: Licensed Clinical Social Worker

## 2020-06-23 DIAGNOSIS — F410 Panic disorder [episodic paroxysmal anxiety] without agoraphobia: Secondary | ICD-10-CM

## 2020-06-23 DIAGNOSIS — D500 Iron deficiency anemia secondary to blood loss (chronic): Secondary | ICD-10-CM

## 2020-06-23 DIAGNOSIS — E1140 Type 2 diabetes mellitus with diabetic neuropathy, unspecified: Secondary | ICD-10-CM

## 2020-06-23 DIAGNOSIS — F3177 Bipolar disorder, in partial remission, most recent episode mixed: Secondary | ICD-10-CM

## 2020-06-23 DIAGNOSIS — N401 Enlarged prostate with lower urinary tract symptoms: Secondary | ICD-10-CM

## 2020-06-23 DIAGNOSIS — R351 Nocturia: Secondary | ICD-10-CM | POA: Diagnosis not present

## 2020-06-23 DIAGNOSIS — Z794 Long term (current) use of insulin: Secondary | ICD-10-CM | POA: Diagnosis not present

## 2020-06-23 DIAGNOSIS — K219 Gastro-esophageal reflux disease without esophagitis: Secondary | ICD-10-CM

## 2020-06-23 NOTE — Chronic Care Management (AMB) (Signed)
Chronic Care Management    Clinical Social Work Follow Up Note  06/23/2020 Name: Camarion Weier MRN: [id number] DOB: 06/23/1961  Jovaun Levene is a 59 y.o. year old male who is a primary care patient of Dettinger, Elige Radon, MD. The CCM team was consulted for assistance with [name]   Review of patient status, including review of consultants reports, other relevant assessments, and collaboration with appropriate care team members and the patient's provider was performed as part of comprehensive patient evaluation and provision of chronic care management services.    SDOH (Social Determinants of Health) assessments performed: No; risk for tobacco use; risk for depression; risk for stress; risk for financial strain; risk for physical inactivity    Office Visit from 05/04/2020 in [facility]  PHQ-9 Total Score 13     GAD 7 : Generalized Anxiety Score 03/30/2020 03/24/2020  Nervous, Anxious, on Edge 1 1  Control/stop worrying 1 1  Worry too much - different things 1 1  Trouble relaxing 0 0  Restless 1 1  Easily annoyed or irritable 0 0  Afraid - awful might happen 0 0  Total GAD 7 Score 4 4  Anxiety Difficulty - Somewhat difficult    Outpatient Encounter Medications as of 06/23/2020  Medication Sig  . Accu-Chek FastClix Lancets MISC CHECH GLUCOSE TWICE DAILY AS NEEDED  . albuterol (VENTOLIN HFA) 108 (90 Base) MCG/ACT inhaler USE 1-2 PUFFS EVERY 4 HOURS AS NEEDED FOR WHEEZING OR SHORTNESS OF BREATH  . albuterol (VENTOLIN HFA) 108 (90 Base) MCG/ACT inhaler USE 1-2 PUFFS EVERY 4 HOURS AS NEEDED FOR WHEEZING OR SHORTNESS OF BREATH  . aspirin EC 81 MG tablet Take 81 mg by mouth daily.  . DULoxetine (CYMBALTA) 60 MG capsule TAKE 2 CAPSULES BY MOUTH EVERY DAY  . gabapentin (NEURONTIN) 300 MG capsule TAKE 1 CAPSULE(300 MG) BY MOUTH TWICE DAILY  . gemfibrozil (LOPID) 600 MG tablet TAKE 1 TABLET(600 MG) BY MOUTH TWICE DAILY BEFORE A MEAL  . glucose blood  (ACCU-CHEK GUIDE) test strip USE TO TEST GLUCOSE TWICE DAILY AND AS NEEDED  . insulin glargine (LANTUS) 100 UNIT/ML injection ADMINISTER 50 UNITS UNDER THE SKIN AT BEDTIME  . insulin regular (HUMULIN R) 100 units/mL injection ADMINISTER 6 TO 10 UNITS UNDER THE SKIN TWICE DAILY BEFORE A MEAL PER SLIDING SCALE  . Insulin Syringe-Needle U-100 (B-D INS SYR ULTRAFINE 1CC/30G) 30G X 1/2" 1 ML MISC Use up to 4 syringes daily as needed Dx E11.40  . JARDIANCE 10 MG TABS tablet TAKE 1 TABLET BY MOUTH DAILY  . levothyroxine (SYNTHROID) 50 MCG tablet TAKE 1 TABLET BY MOUTH EVERY DAY BEFORE BREAKFAST  . lidocaine (LIDODERM) 5 % Place 1 patch onto the skin daily. Remove & Discard patch within 12 hours or as directed by MD  . lisinopril (ZESTRIL) 5 MG tablet TAKE 1 TABLET(5 MG) BY MOUTH DAILY  . metFORMIN (GLUCOPHAGE) 1000 MG tablet TAKE 1 TABLET(1000 MG) BY MOUTH TWICE DAILY WITH A MEAL  . methocarbamol (ROBAXIN) 500 MG tablet Take 1 tablet (500 mg total) by mouth 2 (two) times daily.  . mirtazapine (REMERON) 45 MG tablet TAKE 1 TABLET(45 MG) BY MOUTH AT BEDTIME  . pantoprazole (PROTONIX) 40 MG tablet TAKE 1 TABLET BY MOUTH TWICE DAILY BEFORE A MEAL  . tamsulosin (FLOMAX) 0.4 MG CAPS capsule TAKE 1 CAPSULE(0.4 MG) BY MOUTH DAILY   No facility-administered encounter medications on file as of 06/23/2020.    Goals    .  Client will  talk with LCSW in next 30 days about health issues of client and about client completion of ADLs (pt-stated)      CARE PLAN ENTRY     Current Barriers:  . Patient with Chronic Diagnoses of Type 2 DM, HTN, Bipolar Disorder, BPH, Panic disorder, GERD, Anemia  Clinical Social Work Clinical Goal(s):  [name] LCSW will call client in next 30 days to discuss health issues of client and to discuss client completion of ADLs  Interventions: . Talked with [name] of client, about CCM program services . Talked with daughter about health needs of client . Talked with daughter  about pain issues of client . Talked with daughter about ambulation of client . Talked with daughter about vision challenges of client (wears glasses) . Talked with daughter about sleeping issues of client . Talked with daughter about insurance coverage of client. She said he has recenlty switched over to [employer]  . Talked with daughter about transport needs of client . Talked with daughter about client skin integrity (she said that wound area on his shoulder is now .  healed) . Talked with daughter about upcoming client medical appointments . Encouraged client or daughter to call RNCM as needed for nursing support for client  Patient Self Care Activities:   Eats with set up assistance  Patient Self Care Deficits:   . Pain issues . Mobility challenges  Initial goal documentation     Follow Up Plan: LCSW to call client or Savva Beamer in next 4 weeks to discuss needs of client at that time  [name].Kamariah Fruchter MSW, LCSW Licensed Clinical Social Worker [facility] [phone]

## 2020-06-23 NOTE — Patient Instructions (Addendum)
Licensed Clinical Child psychotherapist Visit Information  Goals we discussed today:   Client will talk with LCSW in next 30 days about health issues of client and about client completion of ADLs (pt-stated)         CARE PLAN ENTRY     Current Barriers:   Patient with Chronic Diagnoses of Type 2 DM, HTN, Bipolar Disorder, BPH, Panic disorder, GERD, Anemia  Clinical Social Work Clinical Goal(s):   LCSW will call client in next 30 days to discuss health issues of client and to discuss client completion of ADLs  Interventions:  Talked with [name] of client, about CCM program services  Talked with daughter about health needs of client  Talked with daughter about pain issues of client  Talked with daughter about ambulation of client  Talked with daughter about vision challenges of client (wears glasses)  Talked with daughter about sleeping issues of client  Talked with daughter about insurance coverage of client. She said he has recenlty switched over to [employer]   Talked with daughter about transport needs of client  Talked with daughter about client skin integrity (she said that wound area on his shoulder is now   healed)  Talked with daughter about upcoming client medical appointments  Encouraged client or daughter to call RNCM as needed for nursing support for client  Patient Self Care Activities:   Eats with set up assistance  Patient Self Care Deficits:    Pain issues  Mobility challenges  Initial goal documentation     Follow Up Plan: LCSW to call client or Jacob Bautista in next 4 weeks to discuss needs of client at that time  Materials Provided: No  The patient/Jacob Bautista, daughter of patient verbalized understanding of instructions provided today and declined a print copy of patient instruction materials.   [name].Marites Nath MSW, LCSW Licensed Clinical Social Worker [facility] [phone]

## 2020-07-01 ENCOUNTER — Other Ambulatory Visit: Payer: Self-pay | Admitting: Family Medicine

## 2020-07-01 DIAGNOSIS — E1140 Type 2 diabetes mellitus with diabetic neuropathy, unspecified: Secondary | ICD-10-CM

## 2020-07-01 DIAGNOSIS — E1165 Type 2 diabetes mellitus with hyperglycemia: Secondary | ICD-10-CM

## 2020-07-01 DIAGNOSIS — F410 Panic disorder [episodic paroxysmal anxiety] without agoraphobia: Secondary | ICD-10-CM

## 2020-07-01 DIAGNOSIS — I10 Essential (primary) hypertension: Secondary | ICD-10-CM

## 2020-07-01 DIAGNOSIS — Z794 Long term (current) use of insulin: Secondary | ICD-10-CM

## 2020-07-01 DIAGNOSIS — R351 Nocturia: Secondary | ICD-10-CM

## 2020-07-01 DIAGNOSIS — E039 Hypothyroidism, unspecified: Secondary | ICD-10-CM

## 2020-07-14 ENCOUNTER — Other Ambulatory Visit: Payer: Self-pay | Admitting: Family Medicine

## 2020-07-14 DIAGNOSIS — E1140 Type 2 diabetes mellitus with diabetic neuropathy, unspecified: Secondary | ICD-10-CM

## 2020-07-19 ENCOUNTER — Telehealth: Payer: Self-pay | Admitting: Family Medicine

## 2020-07-19 NOTE — Telephone Encounter (Signed)
  Incoming Patient Call  07/19/2020  What symptoms do you have? Congestion, cough and sinus issues   How long have you been sick? 2 weeks  Have you been seen for this problem? No  If your provider decides to give you a prescription, which pharmacy would you like for it to be sent to? [facility] - [address]   Patient informed that this information will be sent to the clinical staff for review and that they should receive a follow up call.

## 2020-07-19 NOTE — Telephone Encounter (Signed)
Called patient to inform he would need to be seen in either COVID clinic, televisit or MyChart video visit

## 2020-07-27 ENCOUNTER — Encounter: Payer: Self-pay | Admitting: Family Medicine

## 2020-07-27 ENCOUNTER — Ambulatory Visit (INDEPENDENT_AMBULATORY_CARE_PROVIDER_SITE_OTHER): Payer: Medicare Other | Admitting: Family Medicine

## 2020-07-27 ENCOUNTER — Ambulatory Visit: Payer: Medicare Other | Admitting: Licensed Clinical Social Worker

## 2020-07-27 DIAGNOSIS — J40 Bronchitis, not specified as acute or chronic: Secondary | ICD-10-CM | POA: Diagnosis not present

## 2020-07-27 DIAGNOSIS — I152 Hypertension secondary to endocrine disorders: Secondary | ICD-10-CM

## 2020-07-27 DIAGNOSIS — E1140 Type 2 diabetes mellitus with diabetic neuropathy, unspecified: Secondary | ICD-10-CM

## 2020-07-27 DIAGNOSIS — E1159 Type 2 diabetes mellitus with other circulatory complications: Secondary | ICD-10-CM | POA: Diagnosis not present

## 2020-07-27 DIAGNOSIS — Z794 Long term (current) use of insulin: Secondary | ICD-10-CM | POA: Diagnosis not present

## 2020-07-27 DIAGNOSIS — E039 Hypothyroidism, unspecified: Secondary | ICD-10-CM | POA: Diagnosis not present

## 2020-07-27 DIAGNOSIS — F410 Panic disorder [episodic paroxysmal anxiety] without agoraphobia: Secondary | ICD-10-CM

## 2020-07-27 DIAGNOSIS — F3177 Bipolar disorder, in partial remission, most recent episode mixed: Secondary | ICD-10-CM

## 2020-07-27 DIAGNOSIS — K219 Gastro-esophageal reflux disease without esophagitis: Secondary | ICD-10-CM

## 2020-07-27 DIAGNOSIS — D500 Iron deficiency anemia secondary to blood loss (chronic): Secondary | ICD-10-CM

## 2020-07-27 MED ORDER — AMOXICILLIN 500 MG PO CAPS: 500.0000 mg | ORAL_CAPSULE | Freq: Two times a day (BID) | ORAL | 0 refills | Status: DC

## 2020-07-27 NOTE — Chronic Care Management (AMB) (Signed)
Chronic Care Management    Clinical Social Work Follow Up Note  07/27/2020 Name: Jacob Bautista MRN: [id number] DOB: 1961-03-06  Jacob Bautista is a 59 y.o. year old male who is a primary care patient of Jacob Bautista, Jacob Radon, MD. The CCM team was consulted for assistance with Jacob Bautista .   Review of patient status, including review of consultants reports, other relevant assessments, and collaboration with appropriate care team members and the patient's provider was performed as part of comprehensive patient evaluation and provision of chronic care management services.    SDOH (Social Determinants of Health) assessments performed: No; risk for depression; risk for tobacco use; risk for financial strain; risk for stress    Office Visit from 05/04/2020 in [facility]  PHQ-9 Total Score 13       GAD 7 : Generalized Anxiety Score 03/30/2020 03/24/2020  Nervous, Anxious, on Edge 1 1  Control/stop worrying 1 1  Worry too much - different things 1 1  Trouble relaxing 0 0  Restless 1 1  Easily annoyed or irritable 0 0  Afraid - awful might happen 0 0  Total GAD 7 Score 4 4  Anxiety Difficulty - Somewhat difficult    Outpatient Encounter Medications as of 07/27/2020  Medication Sig  . Accu-Chek FastClix Lancets MISC CHECH GLUCOSE TWICE DAILY AS NEEDED  . albuterol (VENTOLIN HFA) 108 (90 Base) MCG/ACT inhaler USE 1-2 PUFFS EVERY 4 HOURS AS NEEDED FOR WHEEZING OR SHORTNESS OF BREATH  . albuterol (VENTOLIN HFA) 108 (90 Base) MCG/ACT inhaler INHALE 1 TO 2 PUFFS BY MOUTH EVERY 4 HOURS AS NEEDED FOR WHEEZING OR SHORTNESS OF BREATH  . amoxicillin (AMOXIL) 500 MG capsule Take 1 capsule (500 mg total) by mouth 2 (two) times daily.  [name] aspirin EC 81 MG tablet Take 81 mg by mouth daily.  . DULoxetine (CYMBALTA) 60 MG capsule TAKE 2 CAPSULES BY MOUTH EVERY DAY  . gabapentin (NEURONTIN) 300 MG capsule TAKE 1 CAPSULE(300 MG) BY MOUTH TWICE DAILY  . gemfibrozil (LOPID) 600 MG  tablet TAKE 1 TABLET(600 MG) BY MOUTH TWICE DAILY BEFORE A MEAL  . glucose blood (ACCU-CHEK GUIDE) test strip USE TO TEST GLUCOSE TWICE DAILY AND AS NEEDED  . insulin glargine (LANTUS) 100 UNIT/ML injection ADMINISTER 50 UNITS UNDER THE SKIN AT BEDTIME  . insulin regular (HUMULIN R) 100 units/mL injection ADMINISTER 6 TO 10 UNITS UNDER THE SKIN TWICE DAILY BEFORE A MEAL PER SLIDING SCALE  . Insulin Syringe-Needle U-100 (B-D INS SYR ULTRAFINE 1CC/30G) 30G X 1/2" 1 ML MISC Use up to 4 syringes daily as needed Dx E11.40  . JARDIANCE 10 MG TABS tablet TAKE 1 TABLET BY MOUTH DAILY  . levothyroxine (SYNTHROID) 50 MCG tablet TAKE 1 TABLET BY MOUTH EVERY DAY BEFORE BREAKFAST  . lidocaine (LIDODERM) 5 % Place 1 patch onto the skin daily. Remove & Discard patch within 12 hours or as directed by MD  . lisinopril (ZESTRIL) 5 MG tablet TAKE 1 TABLET(5 MG) BY MOUTH DAILY  . metFORMIN (GLUCOPHAGE) 1000 MG tablet TAKE 1 TABLET(1000 MG) BY MOUTH TWICE DAILY WITH A MEAL  . methocarbamol (ROBAXIN) 500 MG tablet Take 1 tablet (500 mg total) by mouth 2 (two) times daily.  . mirtazapine (REMERON) 45 MG tablet TAKE 1 TABLET(45 MG) BY MOUTH AT BEDTIME  . pantoprazole (PROTONIX) 40 MG tablet TAKE 1 TABLET BY MOUTH TWICE DAILY BEFORE A MEAL  . tamsulosin (FLOMAX) 0.4 MG CAPS capsule TAKE 1 CAPSULE(0.4 MG) BY MOUTH DAILY  No facility-administered encounter medications on file as of 07/27/2020.    LCSW called client phone number today and called phone number of daughter of client today but LCSW was not able to speak with client or her daughter via phone today. LCSW did leave a phone message requesting that client or his daughter please return call to LCSW at [phone].   Follow Up Plan:  LCSW to call client or Jacob Bautista in next 4 weeks to discuss needs of client at that time  Jacob Bautista.Jacob Bautista MSW, LCSW Licensed Clinical Social Worker [facility] [phone]

## 2020-07-27 NOTE — Patient Instructions (Addendum)
Licensed Clinical Social Worker Visit Information  Materials Provided: No  07/27/2020  Name: Hayes Czaja  MRN: [id number]       DOB: Mar 30, 1961  Mickael Mcnutt is a 59 y.o. year old male who is a primary care patient of Dettinger, Elige Radon, MD. The CCM team was consulted for assistance with [name]   Review of patient status, including review of consultants reports, other relevant assessments, and collaboration with appropriate care team members and the patient's provider was performed as part of comprehensive patient evaluation and provision of chronic care management services.    SDOH (Social Determinants of Health) assessments performed: No; risk for depression; risk for tobacco use; risk for financial strain; risk for stress  LCSW called client phone number today and called phone number of daughter of client today but LCSW was not able to speak with client or her daughter via phone today. LCSW did leave a phone message requesting that client or his daughter please return call to LCSW at [phone].   Follow Up Plan: LCSW to call client or Vaishnav Demartin in next 4 weeks to discuss needs of client at that time  LCSW was not able to speak with client or his daughter via phone today; thus, the patient or his daughter were not able to verbalize understanding of instructions provided today and were not able to accept or decline a print copy of patient instruction materials.   [name].Gabrielle Wakeland MSW, LCSW Licensed Clinical Social Worker [facility] [phone]

## 2020-07-27 NOTE — Progress Notes (Signed)
Virtual Visit via telephone Note  I connected with Jacob Bautista on 07/27/20 at 1105 by telephone and verified that I am speaking with the correct person using two identifiers. Jacob Bautista is currently located at home and patient are currently with her during visit. The provider, [name], MD is located in their office at time of visit.  Call ended at 1116  I discussed the limitations, risks, security and privacy concerns of performing an evaluation and management service by telephone and the availability of in person appointments. I also discussed with the patient that there may be a patient responsible charge related to this service. The patient expressed understanding and agreed to proceed.   History and Present Illness: Type 2 diabetes mellitus Patient comes in today for recheck of his diabetes. Patient has been currently taking metformin and jardiance and lantus 50, and humilin 10-15 typically. Patient is currently on an ACE inhibitor/ARB. Patient has not seen an ophthalmologist this year. Patient denies any new issues with their feet. The symptom started onset as an adult htn and thyroid and neuropathy ARE RELATED TO DM   Hypertension Patient is currently on lisinopril, and their blood pressure today is 120/70. Patient denies any lightheadedness or dizziness. Patient denies headaches, blurred vision, chest pains, shortness of breath, or weakness. Denies any side effects from medication and is content with current medication.   Hypothyroidism recheck Patient is coming in for thyroid recheck today as well. They deny any issues with hair changes or heat or cold problems or diarrhea or constipation. They deny any chest pain or palpitations. They are currently on levothyroxine 77mcrograms   1. Type 2 diabetes mellitus with diabetic neuropathy, with long-term current use of insulin ([name]   2. Acquired hypothyroidism   3. Hypertension associated with diabetes ([name]   4.  Bronchitis     Outpatient Encounter Medications as of 07/27/2020  Medication Sig  . Accu-Chek FastClix Lancets MISC CHECH GLUCOSE TWICE DAILY AS NEEDED  . albuterol (VENTOLIN HFA) 108 (90 Base) MCG/ACT inhaler USE 1-2 PUFFS EVERY 4 HOURS AS NEEDED FOR WHEEZING OR SHORTNESS OF BREATH  . albuterol (VENTOLIN HFA) 108 (90 Base) MCG/ACT inhaler INHALE 1 TO 2 PUFFS BY MOUTH EVERY 4 HOURS AS NEEDED FOR WHEEZING OR SHORTNESS OF BREATH  . amoxicillin (AMOXIL) 500 MG capsule Take 1 capsule (500 mg total) by mouth 2 (two) times daily.  .Marland Kitchenaspirin EC 81 MG tablet Take 81 mg by mouth daily.  . DULoxetine (CYMBALTA) 60 MG capsule TAKE 2 CAPSULES BY MOUTH EVERY DAY  . gabapentin (NEURONTIN) 300 MG capsule TAKE 1 CAPSULE(300 MG) BY MOUTH TWICE DAILY  . gemfibrozil (LOPID) 600 MG tablet TAKE 1 TABLET(600 MG) BY MOUTH TWICE DAILY BEFORE A MEAL  . glucose blood (ACCU-CHEK GUIDE) test strip USE TO TEST GLUCOSE TWICE DAILY AND AS NEEDED  . insulin glargine (LANTUS) 100 UNIT/ML injection ADMINISTER 50 UNITS UNDER THE SKIN AT BEDTIME  . insulin regular (HUMULIN R) 100 units/mL injection ADMINISTER 6 TO 10 UNITS UNDER THE SKIN TWICE DAILY BEFORE A MEAL PER SLIDING SCALE  . Insulin Syringe-Needle U-100 (B-D INS SYR ULTRAFINE 1CC/30G) 30G X 1/2" 1 ML MISC Use up to 4 syringes daily as needed Dx E11.40  . JARDIANCE 10 MG TABS tablet TAKE 1 TABLET BY MOUTH DAILY  . levothyroxine (SYNTHROID) 50 MCG tablet TAKE 1 TABLET BY MOUTH EVERY DAY BEFORE BREAKFAST  . lidocaine (LIDODERM) 5 % Place 1 patch onto the skin daily. Remove & Discard patch  within 12 hours or as directed by MD  . lisinopril (ZESTRIL) 5 MG tablet TAKE 1 TABLET(5 MG) BY MOUTH DAILY  . metFORMIN (GLUCOPHAGE) 1000 MG tablet TAKE 1 TABLET(1000 MG) BY MOUTH TWICE DAILY WITH A MEAL  . methocarbamol (ROBAXIN) 500 MG tablet Take 1 tablet (500 mg total) by mouth 2 (two) times daily.  . mirtazapine (REMERON) 45 MG tablet TAKE 1 TABLET(45 MG) BY MOUTH AT BEDTIME  .  pantoprazole (PROTONIX) 40 MG tablet TAKE 1 TABLET BY MOUTH TWICE DAILY BEFORE A MEAL  . tamsulosin (FLOMAX) 0.4 MG CAPS capsule TAKE 1 CAPSULE(0.4 MG) BY MOUTH DAILY   No facility-administered encounter medications on file as of 07/27/2020.    Review of Systems  Constitutional: Negative for chills and fever.  HENT: Positive for congestion. Negative for sinus pressure, sinus pain and sneezing.   Respiratory: Negative for cough, shortness of breath and wheezing.   Cardiovascular: Negative for chest pain and leg swelling.  Musculoskeletal: Negative for back pain and gait problem.  Skin: Negative for rash.  Neurological: Negative for dizziness, weakness and numbness.  All other systems reviewed and are negative.   Observations/Objective: Patient sounds comfortable and in no acute distress  Assessment and Plan: Problem List Items Addressed This Visit      Cardiovascular and Mediastinum   Hypertension associated with diabetes ([place])   Relevant Orders   Lipid panel     Endocrine   Type 2 diabetes mellitus with diabetic neuropathy, unspecified ([name]) - Primary   Relevant Orders   Bayer DCA Hb A1c Waived   CMP14+EGFR   Lipid panel   Hypothyroidism   Relevant Orders   TSH    Other Visit Diagnoses    Bronchitis       Relevant Medications   amoxicillin (AMOXIL) 500 MG capsule      Increase humilin R to 20 units with meals.  Follow up plan: Return in about 3 months (around 10/27/2020), or if symptoms worsen or fail to improve, for diabetes and htn.     I discussed the assessment and treatment plan with the patient. The patient was provided an opportunity to ask questions and all were answered. The patient agreed with the plan and demonstrated an understanding of the instructions.   The patient was advised to call back or seek an in-person evaluation if the symptoms worsen or if the condition fails to improve as anticipated.  The above assessment and management plan was  discussed with the patient. The patient verbalized understanding of and has agreed to the management plan. Patient is aware to call the clinic if symptoms persist or worsen. Patient is aware when to return to the clinic for a follow-up visit. Patient educated on when it is appropriate to go to the emergency department.    I provided 11 minutes of non-face-to-face time during this encounter.    Worthy Rancher, MD

## 2020-08-24 ENCOUNTER — Ambulatory Visit: Payer: Medicare Other | Admitting: Licensed Clinical Social Worker

## 2020-08-24 DIAGNOSIS — F410 Panic disorder [episodic paroxysmal anxiety] without agoraphobia: Secondary | ICD-10-CM

## 2020-08-24 DIAGNOSIS — K219 Gastro-esophageal reflux disease without esophagitis: Secondary | ICD-10-CM

## 2020-08-24 DIAGNOSIS — I152 Hypertension secondary to endocrine disorders: Secondary | ICD-10-CM

## 2020-08-24 DIAGNOSIS — F3177 Bipolar disorder, in partial remission, most recent episode mixed: Secondary | ICD-10-CM

## 2020-08-24 DIAGNOSIS — D500 Iron deficiency anemia secondary to blood loss (chronic): Secondary | ICD-10-CM

## 2020-08-24 DIAGNOSIS — R351 Nocturia: Secondary | ICD-10-CM

## 2020-08-24 DIAGNOSIS — Z794 Long term (current) use of insulin: Secondary | ICD-10-CM

## 2020-08-24 NOTE — Patient Instructions (Addendum)
Licensed Clinical Social Worker Visit Information  Materials Provided: No  08/24/2020  Name: Jacob Bautista  MRN: [id number]       DOB: 1960-10-20  Denilson Salminen is a 59 y.o. year old male who is a primary care patient of Dettinger, Elige Radon, MD. The CCM team was consulted for assistance with [name]   Review of patient status, including review of consultants reports, other relevant assessments, and collaboration with appropriate care team members and the patient's provider was performed as part of comprehensive patient evaluation and provision of chronic care management services.    SDOH (Social Determinants of Health) assessments performed: No; risk for depression; risk for tobacco use; risk for financial strain; risk for stress; risk for physical inactivity  LCSW called home phone number of client today but LCSW was not able to speak via phone with client. LCSW left phone message for client today asking him to call LCSW at [phone]. LCSW also called number for [name], daughter of client today; but, LCSW was not able to speak via phone with Marcelle Smiling today. LCSW left phone message requesting Marcelle Smiling please call LCSW at [phone]  Follow Up Plan:LCSW to call client or Justine Cossin in next 4 weeks to discuss social work needs of client at that time :  LCSW was not able to speak today via phone with client or daughter of client; thus, the  client or his daughter were not able to verbalize understanding of instructions provided today and were not able to accept or decline a print copy of patient instruction materials.   [name].Suhaas Agena MSW, LCSW Licensed Clinical Social Worker [facility] [phone]

## 2020-08-24 NOTE — Chronic Care Management (AMB) (Signed)
Chronic Care Management    Clinical Social Work Follow Up Note  08/24/2020 Name: Jacob Bautista MRN: [id number] DOB: 03-Oct-1960  Jacob Bautista is a 59 y.o. year old male who is a primary care patient of Dettinger, Jacob Radon, MD. The CCM team was consulted for assistance with [name]   Review of patient status, including review of consultants reports, other relevant assessments, and collaboration with appropriate care team members and the patient's provider was performed as part of comprehensive patient evaluation and provision of chronic care management services.    SDOH (Social Determinants of Health) assessments performed: No; risk for depression; risk for tobacco use; risk for financial strain; risk for stress; risk for physical inactivity  Flowsheet Row Office Visit from 05/04/2020 in [facility]  PHQ-9 Total Score 13       GAD 7 : Generalized Anxiety Score 03/30/2020 03/24/2020  Nervous, Anxious, on Edge 1 1  Control/stop worrying 1 1  Worry too much - different things 1 1  Trouble relaxing 0 0  Restless 1 1  Easily annoyed or irritable 0 0  Afraid - awful might happen 0 0  Total GAD 7 Score 4 4  Anxiety Difficulty - Somewhat difficult    Outpatient Encounter Medications as of 08/24/2020  Medication Sig  . Accu-Chek FastClix Lancets MISC CHECH GLUCOSE TWICE DAILY AS NEEDED  . albuterol (VENTOLIN HFA) 108 (90 Base) MCG/ACT inhaler USE 1-2 PUFFS EVERY 4 HOURS AS NEEDED FOR WHEEZING OR SHORTNESS OF BREATH  . albuterol (VENTOLIN HFA) 108 (90 Base) MCG/ACT inhaler INHALE 1 TO 2 PUFFS BY MOUTH EVERY 4 HOURS AS NEEDED FOR WHEEZING OR SHORTNESS OF BREATH  . amoxicillin (AMOXIL) 500 MG capsule Take 1 capsule (500 mg total) by mouth 2 (two) times daily.  [name] aspirin EC 81 MG tablet Take 81 mg by mouth daily.  . DULoxetine (CYMBALTA) 60 MG capsule TAKE 2 CAPSULES BY MOUTH EVERY DAY  . gabapentin (NEURONTIN) 300 MG capsule TAKE 1 CAPSULE(300 MG) BY MOUTH  TWICE DAILY  . gemfibrozil (LOPID) 600 MG tablet TAKE 1 TABLET(600 MG) BY MOUTH TWICE DAILY BEFORE A MEAL  . glucose blood (ACCU-CHEK GUIDE) test strip USE TO TEST GLUCOSE TWICE DAILY AND AS NEEDED  . insulin glargine (LANTUS) 100 UNIT/ML injection ADMINISTER 50 UNITS UNDER THE SKIN AT BEDTIME  . insulin regular (HUMULIN R) 100 units/mL injection ADMINISTER 6 TO 10 UNITS UNDER THE SKIN TWICE DAILY BEFORE A MEAL PER SLIDING SCALE  . Insulin Syringe-Needle U-100 (B-D INS SYR ULTRAFINE 1CC/30G) 30G X 1/2" 1 ML MISC Use up to 4 syringes daily as needed Dx E11.40  . JARDIANCE 10 MG TABS tablet TAKE 1 TABLET BY MOUTH DAILY  . levothyroxine (SYNTHROID) 50 MCG tablet TAKE 1 TABLET BY MOUTH EVERY DAY BEFORE BREAKFAST  . lidocaine (LIDODERM) 5 % Place 1 patch onto the skin daily. Remove & Discard patch within 12 hours or as directed by MD  . lisinopril (ZESTRIL) 5 MG tablet TAKE 1 TABLET(5 MG) BY MOUTH DAILY  . metFORMIN (GLUCOPHAGE) 1000 MG tablet TAKE 1 TABLET(1000 MG) BY MOUTH TWICE DAILY WITH A MEAL  . methocarbamol (ROBAXIN) 500 MG tablet Take 1 tablet (500 mg total) by mouth 2 (two) times daily.  . mirtazapine (REMERON) 45 MG tablet TAKE 1 TABLET(45 MG) BY MOUTH AT BEDTIME  . pantoprazole (PROTONIX) 40 MG tablet TAKE 1 TABLET BY MOUTH TWICE DAILY BEFORE A MEAL  . tamsulosin (FLOMAX) 0.4 MG CAPS capsule TAKE 1 CAPSULE(0.4 MG)  BY MOUTH DAILY   No facility-administered encounter medications on file as of 08/24/2020.    LCSW called home phone number of client today but LCSW was not able to speak via phone with client. LCSW left phone message for client today asking him to call LCSW at [phone]. LCSW also called number for Jacob Bautista, daughter of client today; but, LCSW was not able to speak via phone with Jacob Bautista today. LCSW left phone message requesting Jacob Bautista please call LCSW at [phone]  Follow Up Plan: LCSW to call client or Jacob Bautista in next 4 weeks to discuss social work needs  of client at that time  [name].Cleland Simkins MSW, LCSW Licensed Clinical Social Worker [facility] [phone]

## 2020-08-29 ENCOUNTER — Other Ambulatory Visit: Payer: Self-pay | Admitting: Family Medicine

## 2020-09-29 ENCOUNTER — Ambulatory Visit: Payer: Medicare Other | Admitting: Licensed Clinical Social Worker

## 2020-09-29 DIAGNOSIS — K219 Gastro-esophageal reflux disease without esophagitis: Secondary | ICD-10-CM

## 2020-09-29 DIAGNOSIS — N401 Enlarged prostate with lower urinary tract symptoms: Secondary | ICD-10-CM

## 2020-09-29 DIAGNOSIS — J441 Chronic obstructive pulmonary disease with (acute) exacerbation: Secondary | ICD-10-CM

## 2020-09-29 DIAGNOSIS — F410 Panic disorder [episodic paroxysmal anxiety] without agoraphobia: Secondary | ICD-10-CM

## 2020-09-29 DIAGNOSIS — Z794 Long term (current) use of insulin: Secondary | ICD-10-CM

## 2020-09-29 DIAGNOSIS — E1159 Type 2 diabetes mellitus with other circulatory complications: Secondary | ICD-10-CM

## 2020-09-29 DIAGNOSIS — F3177 Bipolar disorder, in partial remission, most recent episode mixed: Secondary | ICD-10-CM

## 2020-09-29 NOTE — Chronic Care Management (AMB) (Signed)
Chronic Care Management    Clinical Social Work Follow Up Note  09/29/2020 Name: Jacob Bautista MRN: [id number] DOB: 1961-01-23  Jacob Bautista is a 59 y.o. year old male who is a primary care patient of Dettinger, Elige Radon, MD. The CCM team was consulted for assistance with Jacob Bautista .   Review of patient status, including review of consultants reports, other relevant assessments, and collaboration with appropriate care team members and the patient's provider was performed as part of comprehensive patient evaluation and provision of chronic care management services.    SDOH (Social Determinants of Health) assessments performed: No; risk for depression; risk for tobacco use; risk for stress; risk for financial strain; risk for physical inactivity  Flowsheet Row Office Visit from 05/04/2020 in [facility]  PHQ-9 Total Score 13     GAD 7 : Generalized Anxiety Score 03/30/2020 03/24/2020  Nervous, Anxious, on Edge 1 1  Control/stop worrying 1 1  Worry too much - different things 1 1  Trouble relaxing 0 0  Restless 1 1  Easily annoyed or irritable 0 0  Afraid - awful might happen 0 0  Total GAD 7 Score 4 4  Anxiety Difficulty - Somewhat difficult    Outpatient Encounter Medications as of 09/29/2020  Medication Sig  . Accu-Chek FastClix Lancets MISC CHECH GLUCOSE TWICE DAILY AS NEEDED  . albuterol (VENTOLIN HFA) 108 (90 Base) MCG/ACT inhaler USE 1-2 PUFFS EVERY 4 HOURS AS NEEDED FOR WHEEZING OR SHORTNESS OF BREATH  . albuterol (VENTOLIN HFA) 108 (90 Base) MCG/ACT inhaler INHALE 1 TO 2 PUFFS BY MOUTH EVERY 4 HOURS AS NEEDED FOR WHEEZING OR SHORTNESS OF BREATH  . amoxicillin (AMOXIL) 500 MG capsule Take 1 capsule (500 mg total) by mouth 2 (two) times daily.  [name] aspirin EC 81 MG tablet Take 81 mg by mouth daily.  . DULoxetine (CYMBALTA) 60 MG capsule TAKE 2 CAPSULES BY MOUTH EVERY DAY  . gabapentin (NEURONTIN) 300 MG capsule TAKE 1 CAPSULE(300 MG) BY MOUTH TWICE  DAILY  . gemfibrozil (LOPID) 600 MG tablet TAKE 1 TABLET(600 MG) BY MOUTH TWICE DAILY BEFORE A MEAL  . glucose blood (ACCU-CHEK GUIDE) test strip USE TO TEST GLUCOSE TWICE DAILY AND AS NEEDED  . insulin glargine (LANTUS) 100 UNIT/ML injection ADMINISTER 50 UNITS UNDER THE SKIN AT BEDTIME  . insulin regular (HUMULIN R) 100 units/mL injection ADMINISTER 6 TO 10 UNITS UNDER THE SKIN TWICE DAILY BEFORE A MEAL PER SLIDING SCALE  . Insulin Syringe-Needle U-100 (B-D INS SYR ULTRAFINE 1CC/30G) 30G X 1/2" 1 ML MISC Use up to 4 syringes daily as needed Dx E11.40  . JARDIANCE 10 MG TABS tablet TAKE 1 TABLET BY MOUTH DAILY  . levothyroxine (SYNTHROID) 50 MCG tablet TAKE 1 TABLET BY MOUTH EVERY DAY BEFORE BREAKFAST  . lidocaine (LIDODERM) 5 % Place 1 patch onto the skin daily. Remove & Discard patch within 12 hours or as directed by MD  . lisinopril (ZESTRIL) 5 MG tablet TAKE 1 TABLET(5 MG) BY MOUTH DAILY  . metFORMIN (GLUCOPHAGE) 1000 MG tablet TAKE 1 TABLET(1000 MG) BY MOUTH TWICE DAILY WITH A MEAL  . methocarbamol (ROBAXIN) 500 MG tablet Take 1 tablet (500 mg total) by mouth 2 (two) times daily.  . mirtazapine (REMERON) 45 MG tablet TAKE 1 TABLET(45 MG) BY MOUTH AT BEDTIME  . pantoprazole (PROTONIX) 40 MG tablet TAKE 1 TABLET BY MOUTH TWICE DAILY BEFORE A MEAL  . tamsulosin (FLOMAX) 0.4 MG CAPS capsule TAKE 1 CAPSULE(0.4 MG) BY MOUTH  DAILY   No facility-administered encounter medications on file as of 09/29/2020.    LCSW called phone number for Jacob Bautista, daughter of client, today but LCSW was not able to speak via phone today with Jacob Bautista. However,LCSW did leave phone message for Jacob Bautista today asking her to please call LCSW at [phone] to discuss needs of Jacob Bautista  Follow Up Plan:LCSW to call client or Jacob Bautista in next 4 weeks to discuss social work needs of client at that time  Jacob Bautista MSW, LCSW Licensed Clinical Social Worker [facility] [phone]

## 2020-09-29 NOTE — Patient Instructions (Addendum)
Licensed Clinical Social Worker Visit Information  Materials Provided: No  09/29/2020  Name: Jacob Bautista  MRN: [id number]       DOB: November 16, 1960  Jacob Bautista is a 60 y.o. year old male who is a primary care patient of Dettinger, Elige Radon, MD. The CCM team was consulted for assistance with [name]   Review of patient status, including review of consultants reports, other relevant assessments, and collaboration with appropriate care team members and the patient's provider was performed as part of comprehensive patient evaluation and provision of chronic care management services.    SDOH (Social Determinants of Health) assessments performed: No; risk for depression; risk for tobacco use; risk for stress; risk for financial strain; risk for physical inactivity  LCSW called phone number for Jacob Bautista, daughter of client, today but LCSW was not able to speak via phone today with Jacob Bautista. However,LCSW did leave phone message for Juanmiguel Defelice today asking her to please call LCSW at [phone] to discuss needs of Jacob Bautista  Follow Up Plan:LCSW to call client or Ford Peddie in next 4 weeks to discusssocial workneeds of client at that time  LCSW was not able to speak via phone today with patient or with daughter of patient; thus, patient or his daughter were not able to verbalize understanding of instructions provided today and were not able to accept or decline a print copy of patient instruction materials.   [name].Anshul Meddings MSW, LCSW Licensed Clinical Social Worker [facility] [phone]

## 2020-11-02 ENCOUNTER — Ambulatory Visit: Payer: Medicare Other | Admitting: Licensed Clinical Social Worker

## 2020-11-02 DIAGNOSIS — N401 Enlarged prostate with lower urinary tract symptoms: Secondary | ICD-10-CM

## 2020-11-02 DIAGNOSIS — Z794 Long term (current) use of insulin: Secondary | ICD-10-CM

## 2020-11-02 DIAGNOSIS — R351 Nocturia: Secondary | ICD-10-CM

## 2020-11-02 DIAGNOSIS — F410 Panic disorder [episodic paroxysmal anxiety] without agoraphobia: Secondary | ICD-10-CM

## 2020-11-02 DIAGNOSIS — K219 Gastro-esophageal reflux disease without esophagitis: Secondary | ICD-10-CM

## 2020-11-02 DIAGNOSIS — E1159 Type 2 diabetes mellitus with other circulatory complications: Secondary | ICD-10-CM

## 2020-11-02 DIAGNOSIS — D500 Iron deficiency anemia secondary to blood loss (chronic): Secondary | ICD-10-CM

## 2020-11-02 DIAGNOSIS — F3177 Bipolar disorder, in partial remission, most recent episode mixed: Secondary | ICD-10-CM

## 2020-11-02 DIAGNOSIS — E1140 Type 2 diabetes mellitus with diabetic neuropathy, unspecified: Secondary | ICD-10-CM

## 2020-11-02 NOTE — Chronic Care Management (AMB) (Signed)
Chronic Care Management    Clinical Social Work Follow Up Note  11/02/2020 Name: Jacob Bautista MRN: [id number] DOB: 1961-08-16  Jacob Bautista is a 60 y.o. year old male who is a primary care patient of Dettinger, Elige Radon, MD. The CCM team was consulted for assistance with Jacob Bautista .   Review of patient status, including review of consultants reports, other relevant assessments, and collaboration with appropriate care team members and the patient's provider was performed as part of comprehensive patient evaluation and provision of chronic care management services.    SDOH (Social Determinants of Health) assessments performed: No; risk for depression; risk for tobacco use; risk for stress; risk for physical inactivity  Flowsheet Row Office Visit from 05/04/2020 in [facility]  PHQ-9 Total Score 13     GAD 7 : Generalized Anxiety Score 03/30/2020 03/24/2020  Nervous, Anxious, on Edge 1 1  Control/stop worrying 1 1  Worry too much - different things 1 1  Trouble relaxing 0 0  Restless 1 1  Easily annoyed or irritable 0 0  Afraid - awful might happen 0 0  Total GAD 7 Score 4 4  Anxiety Difficulty - Somewhat difficult    Outpatient Encounter Medications as of 11/02/2020  Medication Sig  . Accu-Chek FastClix Lancets MISC CHECH GLUCOSE TWICE DAILY AS NEEDED  . albuterol (VENTOLIN HFA) 108 (90 Base) MCG/ACT inhaler USE 1-2 PUFFS EVERY 4 HOURS AS NEEDED FOR WHEEZING OR SHORTNESS OF BREATH  . albuterol (VENTOLIN HFA) 108 (90 Base) MCG/ACT inhaler INHALE 1 TO 2 PUFFS BY MOUTH EVERY 4 HOURS AS NEEDED FOR WHEEZING OR SHORTNESS OF BREATH  . amoxicillin (AMOXIL) 500 MG capsule Take 1 capsule (500 mg total) by mouth 2 (two) times daily.  [name] aspirin EC 81 MG tablet Take 81 mg by mouth daily.  . DULoxetine (CYMBALTA) 60 MG capsule TAKE 2 CAPSULES BY MOUTH EVERY DAY  . gabapentin (NEURONTIN) 300 MG capsule TAKE 1 CAPSULE(300 MG) BY MOUTH TWICE DAILY  . gemfibrozil  (LOPID) 600 MG tablet TAKE 1 TABLET(600 MG) BY MOUTH TWICE DAILY BEFORE A MEAL  . glucose blood (ACCU-CHEK GUIDE) test strip USE TO TEST GLUCOSE TWICE DAILY AND AS NEEDED  . insulin glargine (LANTUS) 100 UNIT/ML injection ADMINISTER 50 UNITS UNDER THE SKIN AT BEDTIME  . insulin regular (HUMULIN R) 100 units/mL injection ADMINISTER 6 TO 10 UNITS UNDER THE SKIN TWICE DAILY BEFORE A MEAL PER SLIDING SCALE  . Insulin Syringe-Needle U-100 (B-D INS SYR ULTRAFINE 1CC/30G) 30G X 1/2" 1 ML MISC Use up to 4 syringes daily as needed Dx E11.40  . JARDIANCE 10 MG TABS tablet TAKE 1 TABLET BY MOUTH DAILY  . levothyroxine (SYNTHROID) 50 MCG tablet TAKE 1 TABLET BY MOUTH EVERY DAY BEFORE BREAKFAST  . lidocaine (LIDODERM) 5 % Place 1 patch onto the skin daily. Remove & Discard patch within 12 hours or as directed by MD  . lisinopril (ZESTRIL) 5 MG tablet TAKE 1 TABLET(5 MG) BY MOUTH DAILY  . metFORMIN (GLUCOPHAGE) 1000 MG tablet TAKE 1 TABLET(1000 MG) BY MOUTH TWICE DAILY WITH A MEAL  . methocarbamol (ROBAXIN) 500 MG tablet Take 1 tablet (500 mg total) by mouth 2 (two) times daily.  . mirtazapine (REMERON) 45 MG tablet TAKE 1 TABLET(45 MG) BY MOUTH AT BEDTIME  . pantoprazole (PROTONIX) 40 MG tablet TAKE 1 TABLET BY MOUTH TWICE DAILY BEFORE A MEAL  . tamsulosin (FLOMAX) 0.4 MG CAPS capsule TAKE 1 CAPSULE(0.4 MG) BY MOUTH DAILY   No  facility-administered encounter medications on file as of 11/02/2020.    LCSW called client home number several times today but was unable to speak via phone with client. LCSW left phone message for client requesting he return call to LCSW at [phone].  LCSW also called phone number for Jacob Bautista, daughter of client but LCSW was not able to speak via phone with Jacob Bautista today. LCSW did leave phone message for Jacob Bautista requesting that she call LCSW at [phone]  Follow Up Plan: LCSW to call client or Jacob Bautista in next 4 weeks to discuss needs of client at that  time  Jacob Bautista.Jacob Bautista MSW, LCSW Licensed Clinical Social Worker [facility] [phone]

## 2020-11-02 NOTE — Patient Instructions (Addendum)
Licensed Clinical Social Worker Visit Information  Materials Provided: No  11/02/2020  Name: Jacob Bautista  MRN: [id number]       DOB: 01/04/1961  Jacob Bautista is a 60 y.o. year old male who is a primary care patient of Dettinger, Elige Radon, MD. The CCM team was consulted for assistance with [name]   Review of patient status, including review of consultants reports, other relevant assessments, and collaboration with appropriate care team members and the patient's provider was performed as part of comprehensive patient evaluation and provision of chronic care management services.    SDOH (Social Determinants of Health) assessments performed: No; risk for depression; risk for tobacco use; risk for stress; risk for physical inactivity  LCSW called client home number several times today but was unable to speak via phone with client. LCSW left phone message for client requesting he return call to LCSW at [phone].  LCSW also called phone number for Jacob Bautista, daughter of client but LCSW was not able to speak via phone with Jacob Bautista today. LCSW did leave phone message for Jacob Bautista requesting that she call LCSW at [phone]  Follow Up Plan:LCSW to call client or Jacob Bautista in next 4 weeks to discuss needs of client at that time  LCSW was not able to speak via phone today with client or daughter of client; thus, the client or daughter of client were not able to verbalize understanding of instructions provided today and were not able to accept or decline a print copy of patient instruction materials.   Jacob Bautista.Jacob Bautista MSW, LCSW Licensed Clinical Social Worker [facility] Care Management [phone]

## 2020-11-06 ENCOUNTER — Ambulatory Visit: Payer: Medicare Other | Admitting: Family Medicine

## 2020-11-07 ENCOUNTER — Encounter: Payer: Self-pay | Admitting: Family Medicine

## 2020-12-01 ENCOUNTER — Other Ambulatory Visit: Payer: Self-pay | Admitting: Family Medicine

## 2020-12-01 DIAGNOSIS — E1140 Type 2 diabetes mellitus with diabetic neuropathy, unspecified: Secondary | ICD-10-CM

## 2020-12-01 DIAGNOSIS — E1165 Type 2 diabetes mellitus with hyperglycemia: Secondary | ICD-10-CM

## 2020-12-01 DIAGNOSIS — E039 Hypothyroidism, unspecified: Secondary | ICD-10-CM

## 2020-12-01 DIAGNOSIS — E785 Hyperlipidemia, unspecified: Secondary | ICD-10-CM

## 2020-12-01 DIAGNOSIS — F410 Panic disorder [episodic paroxysmal anxiety] without agoraphobia: Secondary | ICD-10-CM

## 2020-12-01 DIAGNOSIS — E1169 Type 2 diabetes mellitus with other specified complication: Secondary | ICD-10-CM

## 2020-12-01 DIAGNOSIS — Z794 Long term (current) use of insulin: Secondary | ICD-10-CM

## 2020-12-04 ENCOUNTER — Telehealth: Payer: Medicare Other

## 2020-12-28 ENCOUNTER — Other Ambulatory Visit: Payer: Self-pay | Admitting: Family Medicine

## 2020-12-28 ENCOUNTER — Other Ambulatory Visit: Payer: Self-pay | Admitting: Nurse Practitioner

## 2020-12-28 ENCOUNTER — Other Ambulatory Visit: Payer: Self-pay | Admitting: Gastroenterology

## 2020-12-28 DIAGNOSIS — E039 Hypothyroidism, unspecified: Secondary | ICD-10-CM

## 2020-12-28 DIAGNOSIS — E1140 Type 2 diabetes mellitus with diabetic neuropathy, unspecified: Secondary | ICD-10-CM

## 2020-12-28 DIAGNOSIS — I10 Essential (primary) hypertension: Secondary | ICD-10-CM

## 2020-12-28 DIAGNOSIS — E1165 Type 2 diabetes mellitus with hyperglycemia: Secondary | ICD-10-CM

## 2020-12-28 DIAGNOSIS — F410 Panic disorder [episodic paroxysmal anxiety] without agoraphobia: Secondary | ICD-10-CM

## 2020-12-28 DIAGNOSIS — Z794 Long term (current) use of insulin: Secondary | ICD-10-CM

## 2020-12-28 DIAGNOSIS — E1169 Type 2 diabetes mellitus with other specified complication: Secondary | ICD-10-CM

## 2020-12-28 DIAGNOSIS — R351 Nocturia: Secondary | ICD-10-CM

## 2020-12-30 ENCOUNTER — Other Ambulatory Visit: Payer: Self-pay | Admitting: Family Medicine

## 2020-12-30 DIAGNOSIS — E1140 Type 2 diabetes mellitus with diabetic neuropathy, unspecified: Secondary | ICD-10-CM

## 2020-12-30 DIAGNOSIS — Z794 Long term (current) use of insulin: Secondary | ICD-10-CM

## 2020-12-30 DIAGNOSIS — E1165 Type 2 diabetes mellitus with hyperglycemia: Secondary | ICD-10-CM

## 2021-02-27 ENCOUNTER — Other Ambulatory Visit: Payer: Self-pay | Admitting: Family Medicine

## 2021-03-29 ENCOUNTER — Encounter (HOSPITAL_COMMUNITY): Payer: Self-pay | Admitting: Emergency Medicine

## 2021-03-29 ENCOUNTER — Other Ambulatory Visit: Payer: Self-pay | Admitting: Family Medicine

## 2021-03-29 ENCOUNTER — Other Ambulatory Visit: Payer: Self-pay

## 2021-03-29 ENCOUNTER — Emergency Department (HOSPITAL_COMMUNITY)
Admission: EM | Admit: 2021-03-29 | Discharge: 2021-03-29 | Disposition: A | Discharge status: Home / Self Care | Payer: Medicare Other | Attending: Emergency Medicine | Admitting: Emergency Medicine

## 2021-03-29 DIAGNOSIS — J449 Chronic obstructive pulmonary disease, unspecified: Secondary | ICD-10-CM | POA: Diagnosis not present

## 2021-03-29 DIAGNOSIS — L03211 Cellulitis of face: Secondary | ICD-10-CM | POA: Diagnosis not present

## 2021-03-29 DIAGNOSIS — L03811 Cellulitis of head [any part, except face]: Secondary | ICD-10-CM | POA: Diagnosis not present

## 2021-03-29 DIAGNOSIS — E039 Hypothyroidism, unspecified: Secondary | ICD-10-CM | POA: Diagnosis not present

## 2021-03-29 DIAGNOSIS — F17210 Nicotine dependence, cigarettes, uncomplicated: Secondary | ICD-10-CM | POA: Insufficient documentation

## 2021-03-29 DIAGNOSIS — E1140 Type 2 diabetes mellitus with diabetic neuropathy, unspecified: Secondary | ICD-10-CM | POA: Diagnosis not present

## 2021-03-29 DIAGNOSIS — I10 Essential (primary) hypertension: Secondary | ICD-10-CM | POA: Diagnosis not present

## 2021-03-29 DIAGNOSIS — Z7982 Long term (current) use of aspirin: Secondary | ICD-10-CM | POA: Diagnosis not present

## 2021-03-29 DIAGNOSIS — E1159 Type 2 diabetes mellitus with other circulatory complications: Secondary | ICD-10-CM | POA: Insufficient documentation

## 2021-03-29 DIAGNOSIS — Z79899 Other long term (current) drug therapy: Secondary | ICD-10-CM | POA: Diagnosis not present

## 2021-03-29 DIAGNOSIS — Z794 Long term (current) use of insulin: Secondary | ICD-10-CM | POA: Insufficient documentation

## 2021-03-29 DIAGNOSIS — Z7984 Long term (current) use of oral hypoglycemic drugs: Secondary | ICD-10-CM | POA: Diagnosis not present

## 2021-03-29 MED ORDER — SULFAMETHOXAZOLE-TRIMETHOPRIM 800-160 MG PO TABS: 1.0000 | ORAL_TABLET | Freq: Two times a day (BID) | ORAL | 0 refills | Status: AC

## 2021-03-29 MED ORDER — SULFAMETHOXAZOLE-TRIMETHOPRIM 800-160 MG PO TABS
1.0000 | ORAL_TABLET | Freq: Once | ORAL | Status: AC
  Administered 2021-03-29: 1 via ORAL
  Filled 2021-03-29: qty 1

## 2021-03-29 MED ORDER — LIDOCAINE-EPINEPHRINE 2 %-1:100000 IJ SOLN
20.0000 mL | Freq: Once | INTRAMUSCULAR | Status: AC
  Administered 2021-03-29: 20 mL
  Filled 2021-03-29: qty 20

## 2021-03-29 NOTE — Discharge Instructions (Addendum)
Abscesses and cellulitis are often caused by bacteria that live on your skin naturally all the time.  One way to get rid of this is to put a cup of bleach in a bathtub of water soaking for 10 minutes a day for a week.  If you do this, realize the bleach will stain your clothes, towels, carpets, rugs and anything else with die.  It will also make your feet slippery when used about a tub to be careful not to fall.  Another thing that works very well is chlorhexidine washes.  You can buy chlorhexidine wipes at the store and cleanse your whole body with them once a day for 7 days along with putting mupirocin ointment in your nose and finding chlorhexidine mouthwash to rinse her mouth with twice a day.   

## 2021-03-29 NOTE — ED Triage Notes (Signed)
Pt presents with 2 abscesses to left posterior head.
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
## 2021-04-03 ENCOUNTER — Emergency Department (HOSPITAL_COMMUNITY)
Admission: EM | Admit: 2021-04-03 | Discharge: 2021-04-03 | Disposition: A | Discharge status: Home / Self Care | Payer: Medicare Other | Attending: Emergency Medicine | Admitting: Emergency Medicine

## 2021-04-03 ENCOUNTER — Other Ambulatory Visit: Payer: Self-pay

## 2021-04-03 DIAGNOSIS — Z5321 Procedure and treatment not carried out due to patient leaving prior to being seen by health care provider: Secondary | ICD-10-CM | POA: Diagnosis not present

## 2021-04-03 DIAGNOSIS — L02811 Cutaneous abscess of head [any part, except face]: Secondary | ICD-10-CM | POA: Insufficient documentation

## 2021-04-03 DIAGNOSIS — L0211 Cutaneous abscess of neck: Secondary | ICD-10-CM | POA: Insufficient documentation

## 2021-04-03 DIAGNOSIS — H9202 Otalgia, left ear: Secondary | ICD-10-CM | POA: Diagnosis not present

## 2021-04-03 DIAGNOSIS — R220 Localized swelling, mass and lump, head: Secondary | ICD-10-CM | POA: Diagnosis present

## 2021-04-03 NOTE — ED Triage Notes (Signed)
Pt reports two abscesses to L head/neck. Seen for same at [facility] and given Bactrim. Part of the way through the course of abx.

## 2021-04-03 NOTE — ED Notes (Signed)
Called patient for update vitals patient didn't answer 

## 2021-04-03 NOTE — ED Notes (Signed)
The patent did not respond to updated vitals

## 2021-04-03 NOTE — ED Provider Notes (Signed)
Emergency Medicine Provider Triage Evaluation Note  Jacob Bautista , a 60 y.o. male  was evaluated in triage.  Pt complains of Painful nodules behind L ear and on neck. No fever- seen at [facility]- had them I&D without purulence- now draining and crusted w/ increased pain.  Review of Systems  Positive: Painful nodules Negative: fever  Physical Exam  BP (!) 139/98   Pulse 89   Temp 98.9 F (37.2 C) (Oral)   Resp 12   SpO2 98%  Gen:   Awake, no distress   Resp:  Normal effort  MSK:   Moves extremities without difficulty  Other:  Crusted , erythematous nodules with purulence left occiput and neck  Medical Decision Making  Medically screening exam initiated at 8:35 AM.  Appropriate orders placed.  Malone Vanblarcom was informed that the remainder of the evaluation will be completed by another provider, this initial triage assessment does not replace that evaluation, and the importance of remaining in the ED until their evaluation is complete.  Abscess/vs infected lymph nodes?  Likely  needs I&D   Arthor Captain, PA-C 04/03/21 3729    Cathren Laine, MD 04/05/21 [phone]

## 2021-05-03 DIAGNOSIS — E785 Hyperlipidemia, unspecified: Secondary | ICD-10-CM | POA: Diagnosis not present

## 2021-05-03 DIAGNOSIS — E039 Hypothyroidism, unspecified: Secondary | ICD-10-CM | POA: Diagnosis not present

## 2021-05-03 DIAGNOSIS — Z794 Long term (current) use of insulin: Secondary | ICD-10-CM | POA: Diagnosis not present

## 2021-05-03 DIAGNOSIS — E1169 Type 2 diabetes mellitus with other specified complication: Secondary | ICD-10-CM | POA: Diagnosis not present

## 2021-05-03 DIAGNOSIS — D500 Iron deficiency anemia secondary to blood loss (chronic): Secondary | ICD-10-CM | POA: Diagnosis not present

## 2021-05-03 DIAGNOSIS — E1140 Type 2 diabetes mellitus with diabetic neuropathy, unspecified: Secondary | ICD-10-CM | POA: Diagnosis not present

## 2021-05-03 DIAGNOSIS — K219 Gastro-esophageal reflux disease without esophagitis: Secondary | ICD-10-CM | POA: Diagnosis not present

## 2021-05-03 DIAGNOSIS — E1159 Type 2 diabetes mellitus with other circulatory complications: Secondary | ICD-10-CM | POA: Diagnosis not present

## 2021-05-03 DIAGNOSIS — I152 Hypertension secondary to endocrine disorders: Secondary | ICD-10-CM | POA: Diagnosis not present

## 2021-05-08 ENCOUNTER — Encounter: Payer: Self-pay | Admitting: Internal Medicine

## 2021-05-23 ENCOUNTER — Other Ambulatory Visit: Payer: Self-pay | Admitting: Family Medicine

## 2021-05-23 DIAGNOSIS — Z794 Long term (current) use of insulin: Secondary | ICD-10-CM

## 2021-05-23 DIAGNOSIS — E1140 Type 2 diabetes mellitus with diabetic neuropathy, unspecified: Secondary | ICD-10-CM

## 2021-06-14 DIAGNOSIS — E1159 Type 2 diabetes mellitus with other circulatory complications: Secondary | ICD-10-CM | POA: Diagnosis not present

## 2021-06-14 DIAGNOSIS — Z794 Long term (current) use of insulin: Secondary | ICD-10-CM | POA: Diagnosis not present

## 2021-06-14 DIAGNOSIS — E1140 Type 2 diabetes mellitus with diabetic neuropathy, unspecified: Secondary | ICD-10-CM | POA: Diagnosis not present

## 2021-06-14 DIAGNOSIS — I152 Hypertension secondary to endocrine disorders: Secondary | ICD-10-CM | POA: Diagnosis not present

## 2021-07-14 ENCOUNTER — Emergency Department (HOSPITAL_COMMUNITY)
Admission: EM | Admit: 2021-07-14 | Discharge: 2021-07-14 | Disposition: A | Discharge status: Home / Self Care | Payer: Medicare Other | Attending: Emergency Medicine | Admitting: Emergency Medicine

## 2021-07-14 ENCOUNTER — Encounter (HOSPITAL_COMMUNITY): Payer: Self-pay | Admitting: Emergency Medicine

## 2021-07-14 DIAGNOSIS — Z7984 Long term (current) use of oral hypoglycemic drugs: Secondary | ICD-10-CM | POA: Insufficient documentation

## 2021-07-14 DIAGNOSIS — Z794 Long term (current) use of insulin: Secondary | ICD-10-CM | POA: Insufficient documentation

## 2021-07-14 DIAGNOSIS — Z7982 Long term (current) use of aspirin: Secondary | ICD-10-CM | POA: Insufficient documentation

## 2021-07-14 DIAGNOSIS — I10 Essential (primary) hypertension: Secondary | ICD-10-CM | POA: Insufficient documentation

## 2021-07-14 DIAGNOSIS — E1140 Type 2 diabetes mellitus with diabetic neuropathy, unspecified: Secondary | ICD-10-CM | POA: Diagnosis not present

## 2021-07-14 DIAGNOSIS — F17210 Nicotine dependence, cigarettes, uncomplicated: Secondary | ICD-10-CM | POA: Diagnosis not present

## 2021-07-14 DIAGNOSIS — E039 Hypothyroidism, unspecified: Secondary | ICD-10-CM | POA: Insufficient documentation

## 2021-07-14 DIAGNOSIS — Z79899 Other long term (current) drug therapy: Secondary | ICD-10-CM | POA: Insufficient documentation

## 2021-07-14 DIAGNOSIS — J340 Abscess, furuncle and carbuncle of nose: Secondary | ICD-10-CM

## 2021-07-14 DIAGNOSIS — J441 Chronic obstructive pulmonary disease with (acute) exacerbation: Secondary | ICD-10-CM | POA: Insufficient documentation

## 2021-07-14 MED ORDER — DOXYCYCLINE HYCLATE 100 MG PO TABS
100.0000 mg | ORAL_TABLET | Freq: Once | ORAL | Status: AC
  Administered 2021-07-14: 100 mg via ORAL
  Filled 2021-07-14: qty 1

## 2021-07-14 MED ORDER — DOXYCYCLINE HYCLATE 100 MG PO CAPS: 100.0000 mg | ORAL_CAPSULE | Freq: Two times a day (BID) | ORAL | 0 refills | Status: DC

## 2021-07-14 NOTE — ED Triage Notes (Signed)
Pt c/o abscess to left side of nose. Pt states it started about 1 wk ago. Hx of MRSA in 2010.

## 2021-07-14 NOTE — Discharge Instructions (Addendum)
Soak area 20 minutes four times a day
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
## 2021-08-13 DIAGNOSIS — N529 Male erectile dysfunction, unspecified: Secondary | ICD-10-CM | POA: Insufficient documentation

## 2021-08-30 ENCOUNTER — Other Ambulatory Visit: Payer: Self-pay | Admitting: Family Medicine

## 2021-09-25 ENCOUNTER — Encounter: Payer: Self-pay | Admitting: Internal Medicine

## 2021-09-25 ENCOUNTER — Ambulatory Visit: Payer: Medicare Other | Admitting: Gastroenterology

## 2021-09-28 ENCOUNTER — Other Ambulatory Visit: Payer: Self-pay | Admitting: Family Medicine

## 2021-09-28 DIAGNOSIS — E1140 Type 2 diabetes mellitus with diabetic neuropathy, unspecified: Secondary | ICD-10-CM

## 2021-09-28 DIAGNOSIS — Z794 Long term (current) use of insulin: Secondary | ICD-10-CM

## 2022-01-02 ENCOUNTER — Emergency Department (HOSPITAL_COMMUNITY)
Admission: EM | Admit: 2022-01-02 | Discharge: 2022-01-02 | Discharge status: Against Medical Advice | Payer: Medicare Other | Attending: Emergency Medicine | Admitting: Emergency Medicine

## 2022-01-02 ENCOUNTER — Encounter (HOSPITAL_COMMUNITY): Payer: Self-pay

## 2022-01-02 ENCOUNTER — Other Ambulatory Visit: Payer: Self-pay

## 2022-01-02 DIAGNOSIS — F1910 Other psychoactive substance abuse, uncomplicated: Secondary | ICD-10-CM | POA: Diagnosis not present

## 2022-01-02 DIAGNOSIS — R739 Hyperglycemia, unspecified: Secondary | ICD-10-CM | POA: Insufficient documentation

## 2022-01-02 DIAGNOSIS — Y900 Blood alcohol level of less than 20 mg/100 ml: Secondary | ICD-10-CM | POA: Insufficient documentation

## 2022-01-02 DIAGNOSIS — Z79899 Other long term (current) drug therapy: Secondary | ICD-10-CM | POA: Diagnosis not present

## 2022-01-02 DIAGNOSIS — Z20822 Contact with and (suspected) exposure to covid-19: Secondary | ICD-10-CM | POA: Diagnosis not present

## 2022-01-02 DIAGNOSIS — R451 Restlessness and agitation: Secondary | ICD-10-CM | POA: Diagnosis present

## 2022-01-02 DIAGNOSIS — Z7982 Long term (current) use of aspirin: Secondary | ICD-10-CM | POA: Diagnosis not present

## 2022-01-02 DIAGNOSIS — Z794 Long term (current) use of insulin: Secondary | ICD-10-CM | POA: Diagnosis not present

## 2022-01-02 LAB — URINALYSIS, ROUTINE W REFLEX MICROSCOPIC
Bacteria, UA: NONE SEEN
Bilirubin Urine: NEGATIVE
Glucose, UA: >=500 mg/dL — AB
Hgb urine dipstick: NEGATIVE
Ketones, ur: NEGATIVE mg/dL
Leukocytes,Ua: NEGATIVE
Nitrite: NEGATIVE
Protein, ur: NEGATIVE mg/dL
Specific Gravity, Urine: 1.028 (ref 1.005–1.030)
pH: 7 (ref 5.0–8.0)

## 2022-01-02 LAB — COMPREHENSIVE METABOLIC PANEL
ALT: 32 U/L (ref 0–44)
AST: 31 U/L (ref 15–41)
Albumin: 3.7 g/dL (ref 3.5–5.0)
Alkaline Phosphatase: 113 U/L (ref 38–126)
Anion gap: 9 (ref 5–15)
BUN: 17 mg/dL (ref 8–23)
CO2: 23 mmol/L (ref 22–32)
Calcium: 8.9 mg/dL (ref 8.9–10.3)
Chloride: 100 mmol/L (ref 98–111)
Creatinine, Ser: 0.8 mg/dL (ref 0.61–1.24)
GFR, Estimated: >60 mL/min (ref >60)
Glucose, Bld: 660 mg/dL (ref 70–99)
Potassium: 3.7 mmol/L (ref 3.5–5.1)
Sodium: 132 mmol/L — ABNORMAL LOW (ref 135–145)
Total Bilirubin: 0.3 mg/dL (ref 0.3–1.2)
Total Protein: 7.1 g/dL (ref 6.5–8.1)

## 2022-01-02 LAB — CBC WITH DIFFERENTIAL/PLATELET
Abs Immature Granulocytes: 0.01 10*3/uL (ref 0.00–0.07)
Basophils Absolute: 0 10*3/uL (ref 0.0–0.1)
Basophils Relative: 0 %
Eosinophils Absolute: 0.2 10*3/uL (ref 0.0–0.5)
Eosinophils Relative: 4 %
HCT: 34.1 % — ABNORMAL LOW (ref 39.0–52.0)
Hemoglobin: 11.1 g/dL — ABNORMAL LOW (ref 13.0–17.0)
Immature Granulocytes: 0 %
Lymphocytes Relative: 26 %
Lymphs Abs: 1.2 10*3/uL (ref 0.7–4.0)
MCH: 25.3 pg — ABNORMAL LOW (ref 26.0–34.0)
MCHC: 32.6 g/dL (ref 30.0–36.0)
MCV: 77.7 fL — ABNORMAL LOW (ref 80.0–100.0)
Monocytes Absolute: 0.2 10*3/uL (ref 0.1–1.0)
Monocytes Relative: 5 %
Neutro Abs: 2.9 10*3/uL (ref 1.7–7.7)
Neutrophils Relative %: 65 %
Platelets: 164 10*3/uL (ref 150–400)
RBC: 4.39 MIL/uL (ref 4.22–5.81)
RDW: 15.5 % (ref 11.5–15.5)
WBC: 4.5 10*3/uL (ref 4.0–10.5)
nRBC: 0 % (ref 0.0–0.2)

## 2022-01-02 LAB — ETHANOL: Alcohol, Ethyl (B): <10 mg/dL (ref <10)

## 2022-01-02 LAB — RAPID URINE DRUG SCREEN, HOSP PERFORMED
Amphetamines: POSITIVE — AB
Barbiturates: NOT DETECTED
Benzodiazepines: NOT DETECTED
Cocaine: NOT DETECTED
Opiates: NOT DETECTED
Tetrahydrocannabinol: NOT DETECTED

## 2022-01-02 LAB — RESP PANEL BY RT-PCR (FLU A&B, COVID) ARPGX2
Influenza A by PCR: NEGATIVE
Influenza B by PCR: NEGATIVE
SARS Coronavirus 2 by RT PCR: NEGATIVE

## 2022-01-02 LAB — CBG MONITORING, ED: Glucose-Capillary: 250 mg/dL — ABNORMAL HIGH (ref 70–99)

## 2022-01-02 MED ORDER — INSULIN ASPART 100 UNIT/ML IJ SOLN: 6.0000 [IU] | Freq: Three times a day (TID) | INTRAMUSCULAR | Status: DC

## 2022-01-02 MED ORDER — INSULIN ASPART 100 UNIT/ML IJ SOLN
6.0000 [IU] | Freq: Three times a day (TID) | INTRAMUSCULAR | Status: DC
  Administered 2022-01-02: 10 [IU] via SUBCUTANEOUS
  Filled 2022-01-02: qty 1

## 2022-01-02 MED ORDER — MIRTAZAPINE 15 MG PO TABS: 45.0000 mg | ORAL_TABLET | Freq: Every day | ORAL | Status: DC

## 2022-01-02 MED ORDER — METHOCARBAMOL 500 MG PO TABS
500.0000 mg | ORAL_TABLET | Freq: Two times a day (BID) | ORAL | Status: DC
Start: 2022-01-02 — End: 2022-01-03

## 2022-01-02 MED ORDER — LORAZEPAM 2 MG/ML IJ SOLN
1.0000 mg | Freq: Once | INTRAMUSCULAR | Status: AC
  Administered 2022-01-02: 1 mg via INTRAVENOUS
  Filled 2022-01-02: qty 1

## 2022-01-02 MED ORDER — LOPERAMIDE HCL 2 MG PO CAPS: 2.0000 mg | ORAL_CAPSULE | ORAL | Status: DC | PRN

## 2022-01-02 MED ORDER — EMPAGLIFLOZIN 10 MG PO TABS
10.0000 mg | ORAL_TABLET | Freq: Every day | ORAL | Status: DC
  Administered 2022-01-02: 10 mg via ORAL
  Filled 2022-01-02 (×3): qty 1

## 2022-01-02 MED ORDER — DICYCLOMINE HCL 10 MG PO CAPS
20.0000 mg | ORAL_CAPSULE | Freq: Four times a day (QID) | ORAL | Status: DC | PRN
  Administered 2022-01-02: 20 mg via ORAL
  Filled 2022-01-02: qty 2

## 2022-01-02 MED ORDER — LEVOTHYROXINE SODIUM 50 MCG PO TABS: 50.0000 ug | ORAL_TABLET | Freq: Every day | ORAL | Status: DC

## 2022-01-02 MED ORDER — ASPIRIN EC 81 MG PO TBEC
81.0000 mg | DELAYED_RELEASE_TABLET | Freq: Every day | ORAL | Status: DC
  Administered 2022-01-02: 81 mg via ORAL
  Filled 2022-01-02: qty 1

## 2022-01-02 MED ORDER — PANTOPRAZOLE SODIUM 40 MG PO TBEC
40.0000 mg | DELAYED_RELEASE_TABLET | Freq: Two times a day (BID) | ORAL | Status: DC
  Administered 2022-01-02: 40 mg via ORAL
  Filled 2022-01-02: qty 1

## 2022-01-02 MED ORDER — LISINOPRIL 5 MG PO TABS
5.0000 mg | ORAL_TABLET | Freq: Every day | ORAL | Status: DC
  Administered 2022-01-02: 5 mg via ORAL
  Filled 2022-01-02: qty 1

## 2022-01-02 MED ORDER — ONDANSETRON 4 MG PO TBDP
4.0000 mg | ORAL_TABLET | Freq: Four times a day (QID) | ORAL | Status: DC | PRN
Start: 2022-01-02 — End: 2022-01-03

## 2022-01-02 MED ORDER — INSULIN GLARGINE-YFGN 100 UNIT/ML ~~LOC~~ SOLN
50.0000 [IU] | Freq: Every day | SUBCUTANEOUS | Status: DC
  Filled 2022-01-02: qty 0.5

## 2022-01-02 MED ORDER — ALBUTEROL SULFATE HFA 108 (90 BASE) MCG/ACT IN AERS: 1.0000 | INHALATION_SPRAY | RESPIRATORY_TRACT | Status: DC | PRN

## 2022-01-02 MED ORDER — HYDROXYZINE HCL 25 MG PO TABS
25.0000 mg | ORAL_TABLET | Freq: Four times a day (QID) | ORAL | Status: DC | PRN
  Administered 2022-01-02: 25 mg via ORAL
  Filled 2022-01-02: qty 1

## 2022-01-02 MED ORDER — NAPROXEN 250 MG PO TABS
500.0000 mg | ORAL_TABLET | Freq: Two times a day (BID) | ORAL | Status: DC | PRN
Start: 2022-01-02 — End: 2022-01-03
  Administered 2022-01-02: 500 mg via ORAL
  Filled 2022-01-02: qty 2

## 2022-01-02 MED ORDER — METHOCARBAMOL 500 MG PO TABS
500.0000 mg | ORAL_TABLET | Freq: Three times a day (TID) | ORAL | Status: DC | PRN
Start: 2022-01-02 — End: 2022-01-03

## 2022-01-02 MED ORDER — METFORMIN HCL 500 MG PO TABS
1000.0000 mg | ORAL_TABLET | Freq: Two times a day (BID) | ORAL | Status: DC
  Administered 2022-01-02: 1000 mg via ORAL
  Filled 2022-01-02: qty 2

## 2022-01-02 MED ORDER — ALBUTEROL SULFATE (2.5 MG/3ML) 0.083% IN NEBU: 2.5000 mg | INHALATION_SOLUTION | RESPIRATORY_TRACT | Status: DC | PRN

## 2022-01-02 NOTE — ED Notes (Addendum)
Jacob Bautista in room 17 is really agitated. He wants to leave. I have given him Hydroxyzine, Bentyl and Naproxen.  ?

## 2022-01-02 NOTE — ED Notes (Addendum)
Date and time results received: 01/02/22 6:33 PM ? ? ?Test: glucose ?Critical Value: 660 ? ?Name of Provider Notified: Dr. Shirlean Schlein ? ?Orders Received? Or Actions Taken?: see orders ?

## 2022-01-02 NOTE — ED Triage Notes (Signed)
Pt reports that he is here for opoid withdrawal.  Last use 2 days ago.  Pt is alert and oriented.  Reports high anxiety.  Resp even and unlabored.  Skin warm and dry.  Reports SI thoughts.  ?

## 2022-01-02 NOTE — ED Provider Notes (Signed)
?[facility] ?Provider Note ? ? ?CSN: [id number] ?Arrival date & time: 01/02/22  1615 ? ?  ? ?History ? ?Chief Complaint  ?Patient presents with  ? Withdrawal  ? Suicidal  ? ? ?Jacob Bautista is a 61 y.o. male. ? ?HPI ?Patient presents with concern of restlessness, nausea, and suicidal ideation.  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  Last used 3 days ago.  With worsening feelings patient also complains of depression related to his addiction, frustration with his son having a similar ongoing issue., no focal physical pain, nor focal weakness.  There is vomiting, but not persistently. ?No relief with anything. ? ?  ? ?Home Medications ?Prior to Admission medications   ?Medication Sig Start Date End Date Taking? Authorizing Provider  ?albuterol (VENTOLIN HFA) 108 (90 Base) MCG/ACT inhaler USE 1-2 PUFFS EVERY 4 HOURS AS NEEDED FOR WHEEZING OR SHORTNESS OF BREATH 05/16/20  Yes Dettinger, Elige Radon, MD  ?aspirin EC 81 MG tablet Take 81 mg by mouth daily.   Yes [provider]  ?insulin glargine (LANTUS) 100 UNIT/ML injection ADMINISTER 50 UNITS UNDER THE SKIN AT BEDTIME 07/14/20  Yes Dettinger, Elige Radon, MD  ?insulin regular (HUMULIN R) 100 units/mL injection ADMINISTER 6 TO 10 UNITS UNDER THE SKIN TWICE DAILY BEFORE A MEAL PER SLIDING SCALE 08/30/20  Yes Dettinger, Elige Radon, MD  ?JARDIANCE 10 MG TABS tablet TAKE 1 TABLET BY MOUTH DAILY 07/03/20  Yes Dettinger, Elige Radon, MD  ?levothyroxine (SYNTHROID) 50 MCG tablet TAKE 1 TABLET BY MOUTH EVERY DAY BEFORE BREAKFAST ?Patient taking differently: Take 50 mcg by mouth daily before breakfast. 07/03/20  Yes Dettinger, Elige Radon, MD  ?lidocaine (LIDODERM) 5 % Place 1 patch onto the skin daily. Remove & Discard patch within 12 hours or as directed by MD 03/10/20  Yes Idol, Raynelle Fanning, PA-C  ?lisinopril (ZESTRIL) 5 MG tablet TAKE 1 TABLET(5 MG) BY MOUTH DAILY ?Patient taking differently: Take 5 mg by mouth daily.  07/03/20  Yes Dettinger, Elige Radon, MD  ?metFORMIN (GLUCOPHAGE) 1000 MG tablet TAKE 1 TABLET(1000 MG) BY MOUTH TWICE DAILY WITH A MEAL ?Patient taking differently: Take 1,000 mg by mouth 2 (two) times daily with a meal. 07/03/20  Yes Dettinger, Elige Radon, MD  ?mirtazapine (REMERON) 45 MG tablet TAKE 1 TABLET(45 MG) BY MOUTH AT BEDTIME ?Patient taking differently: Take 45 mg by mouth at bedtime. 07/03/20  Yes Dettinger, Elige Radon, MD  ?pantoprazole (PROTONIX) 40 MG tablet TAKE 1 TABLET BY MOUTH TWICE DAILY BEFORE A MEAL 12/28/20  Yes Anice Paganini, NP  ?rosuvastatin (CRESTOR) 5 MG tablet Take 5 mg by mouth daily. 11/12/21  Yes [provider]  ?tadalafil (CIALIS) 10 MG tablet Take 10 mg by mouth daily as needed for erectile dysfunction. 08/10/21  Yes [provider]  ?tamsulosin (FLOMAX) 0.4 MG CAPS capsule TAKE 1 CAPSULE(0.4 MG) BY MOUTH DAILY ?Patient taking differently: Take 0.4 mg by mouth daily. 07/03/20  Yes Dettinger, Elige Radon, MD  ?Accu-Chek FastClix Lancets MISC [facility] GLUCOSE TWICE DAILY AS NEEDED 02/04/20   Dettinger, Elige Radon, MD  ?albuterol (VENTOLIN HFA) 108 (90 Base) MCG/ACT inhaler INHALE 1 TO 2 PUFFS BY MOUTH EVERY 4 HOURS AS NEEDED FOR WHEEZING OR SHORTNESS OF BREATH 07/14/20   Dettinger, Elige Radon, MD  ?amoxicillin (AMOXIL) 500 MG capsule Take 1 capsule (500 mg total) by mouth 2 (two) times daily. 07/27/20   Dettinger, Elige Radon, MD  ?doxycycline (VIBRAMYCIN) 100 MG capsule  Take 1 capsule (100 mg total) by mouth 2 (two) times daily. ?Patient not taking: Reported on 01/02/2022 07/14/21   Elson AreasSofia, Leslie K, PA-C  ?DULoxetine (CYMBALTA) 30 MG capsule Take 1 capsule by mouth daily. ?Patient not taking: Reported on 01/02/2022 05/03/21   [provider]  ?DULoxetine (CYMBALTA) 60 MG capsule TAKE 2 CAPSULES BY MOUTH EVERY DAY ?Patient not taking: Reported on 01/02/2022 05/29/20   Dettinger, Elige RadonJoshua A, MD  ?gabapentin (NEURONTIN) 300 MG capsule TAKE 1 CAPSULE(300 MG) BY MOUTH TWICE DAILY ?Patient not  taking: Reported on 01/02/2022 07/30/19   Dettinger, Elige RadonJoshua A, MD  ?gemfibrozil (LOPID) 600 MG tablet TAKE 1 TABLET(600 MG) BY MOUTH TWICE DAILY BEFORE A MEAL ?Patient not taking: Reported on 01/02/2022 04/03/20   Dettinger, Elige RadonJoshua A, MD  ?glucose blood (ACCU-CHEK GUIDE) test strip USE TO TEST GLUCOSE TWICE DAILY AND AS NEEDED 02/08/20   Dettinger, Elige RadonJoshua A, MD  ?Insulin Syringe-Needle U-100 (B-D INS SYR ULTRAFINE 1CC/30G) 30G X 1/2" 1 ML MISC Use up to 4 syringes daily as needed Dx E11.40 05/16/20   Dettinger, Elige RadonJoshua A, MD  ?methocarbamol (ROBAXIN) 500 MG tablet Take 1 tablet (500 mg total) by mouth 2 (two) times daily. ?Patient not taking: Reported on 01/02/2022 03/10/20   Burgess AmorIdol, Julie, PA-C  ?   ? ?Allergies    ?Patient has no known allergies.   ? ?Review of Systems   ?Review of Systems  ?All other systems reviewed and are negative. ? ?Physical Exam ?Updated Vital Signs ?BP 137/76 (BP Location: Right Arm)   Pulse 95   Temp 98.7 ?F (37.1 ?C) (Oral)   Resp 18   Ht 6' (1.829 m)   Wt 77.1 kg   SpO2 99%   BMI 23.06 kg/m?  ?Physical Exam ?Vitals and nursing note reviewed.  ?Constitutional:   ?   General: He is not in acute distress. ?   Appearance: He is well-developed.  ?HENT:  ?   Head: Normocephalic and atraumatic.  ?Eyes:  ?   Conjunctiva/sclera: Conjunctivae normal.  ?Cardiovascular:  ?   Rate and Rhythm: Normal rate and regular rhythm.  ?Pulmonary:  ?   Effort: Pulmonary effort is normal. No respiratory distress.  ?   Breath sounds: No stridor.  ?Abdominal:  ?   General: There is no distension.  ?Skin: ?   General: Skin is warm and dry.  ?Neurological:  ?   Mental Status: He is alert and oriented to person, place, and time.  ?Psychiatric:  ?   Comments: Depressed affect  ? ? ?ED Results / Procedures / Treatments   ?Labs ?(all labs ordered are listed, but only abnormal results are displayed) ?Labs Reviewed  ?COMPREHENSIVE METABOLIC PANEL - Abnormal; Notable for the following components:  ?    Result Value  ? Sodium  132 (*)   ? Glucose, Bld 660 (*)   ? All other components within normal limits  ?CBC WITH DIFFERENTIAL/PLATELET - Abnormal; Notable for the following components:  ? Hemoglobin 11.1 (*)   ? HCT 34.1 (*)   ? MCV 77.7 (*)   ? MCH 25.3 (*)   ? All other components within normal limits  ?RAPID URINE DRUG SCREEN, HOSP PERFORMED - Abnormal; Notable for the following components:  ? Amphetamines POSITIVE (*)   ? All other components within normal limits  ?URINALYSIS, ROUTINE W REFLEX MICROSCOPIC - Abnormal; Notable for the following components:  ? Color, Urine STRAW (*)   ? Glucose, UA >=500 (*)   ? All other components  within normal limits  ?CBG MONITORING, ED - Abnormal; Notable for the following components:  ? Glucose-Capillary 250 (*)   ? All other components within normal limits  ?RESP PANEL BY RT-PCR (FLU A&B, COVID) ARPGX2  ?ETHANOL  ? ? ?EKG ?None ? ?Radiology ?No results found. ? ?Procedures ?Procedures  ? ? ?Medications Ordered in ED ?Medications  ?dicyclomine (BENTYL) capsule 20 mg (20 mg Oral Given 01/02/22 1756)  ?hydrOXYzine (ATARAX) tablet 25 mg (25 mg Oral Given 01/02/22 1755)  ?loperamide (IMODIUM) capsule 2-4 mg (has no administration in time range)  ?methocarbamol (ROBAXIN) tablet 500 mg (has no administration in time range)  ?naproxen (NAPROSYN) tablet 500 mg (500 mg Oral Given 01/02/22 1927)  ?ondansetron (ZOFRAN-ODT) disintegrating tablet 4 mg (has no administration in time range)  ?aspirin EC tablet 81 mg (81 mg Oral Given 01/02/22 1752)  ?insulin glargine-yfgn (SEMGLEE) injection 50 Units (has no administration in time range)  ?empagliflozin (JARDIANCE) tablet 10 mg (10 mg Oral Given 01/02/22 2058)  ?levothyroxine (SYNTHROID) tablet 50 mcg (has no administration in time range)  ?lisinopril (ZESTRIL) tablet 5 mg (5 mg Oral Given 01/02/22 1752)  ?methocarbamol (ROBAXIN) tablet 500 mg (has no administration in time range)  ?metFORMIN (GLUCOPHAGE) tablet 1,000 mg (1,000 mg Oral Given 01/02/22 1752)   ?pantoprazole (PROTONIX) EC tablet 40 mg (40 mg Oral Given 01/02/22 1752)  ?mirtazapine (REMERON) tablet 45 mg (has no administration in time range)  ?albuterol (PROVENTIL) (2.5 MG/3ML) 0.083% nebulizer solution 2.5 mg (h

## 2022-03-27 ENCOUNTER — Other Ambulatory Visit: Payer: Self-pay | Admitting: Family Medicine

## 2022-04-11 IMAGING — CT CT ANGIO CHEST
2 of 6 series · 19 of 46 positions shown · IV contrast (Omnipaque or Isovue)
Comparison: 10/14/2016

CLINICAL DATA: Left shoulder pain, elevated D-dimer

EXAM:
CT ANGIOGRAPHY CHEST WITH CONTRAST
TECHNIQUE: Multidetector CT imaging of the chest was performed using the
standard protocol during bolus administration of intravenous
contrast. Multiplanar CT image reconstructions and MIPs were
obtained to evaluate the vascular anatomy.
CONTRAST:  100mL OMNIPAQUE IOHEXOL 350 MG/ML SOLN

[Series 5: pe axial thins · axial · 0.85mm/px · z∈[+1190,+1515]mm · 16 of 357 slices shown]
[im 16/357  lung]
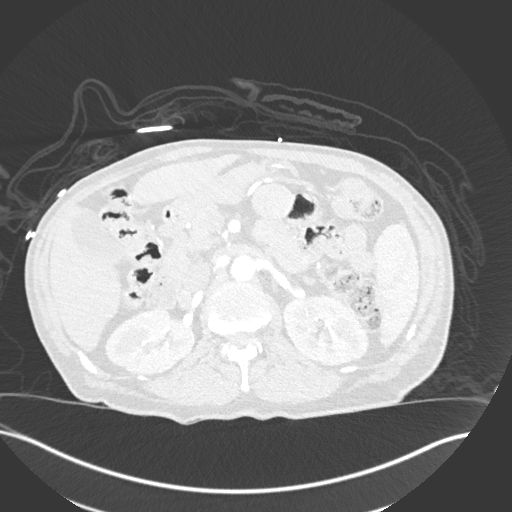
[im 47/357  soft-tissue]
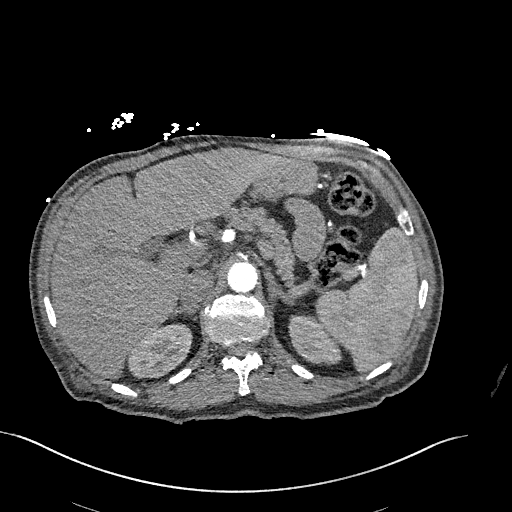
[im 62/357  lung]
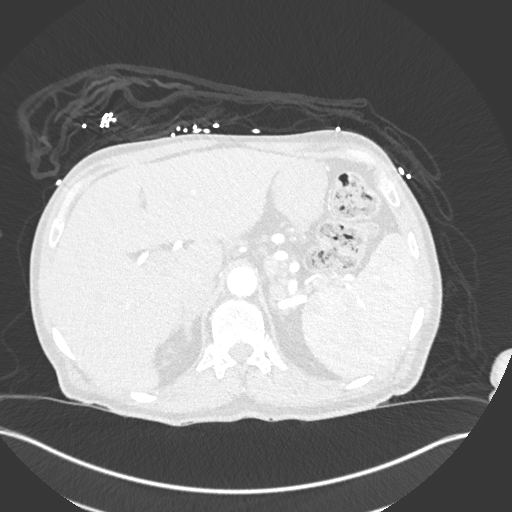
[im 78/357  soft-tissue]
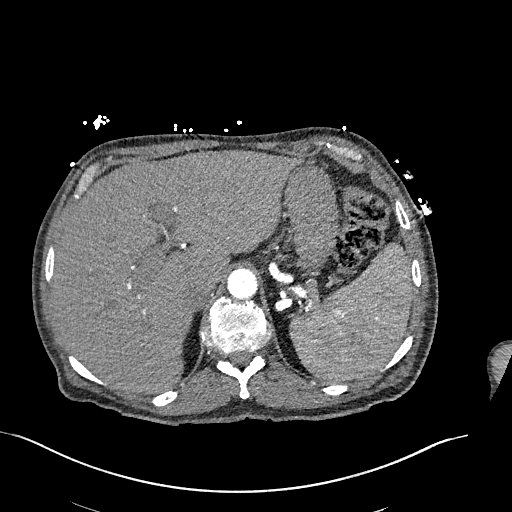
[im 109/357  lung]
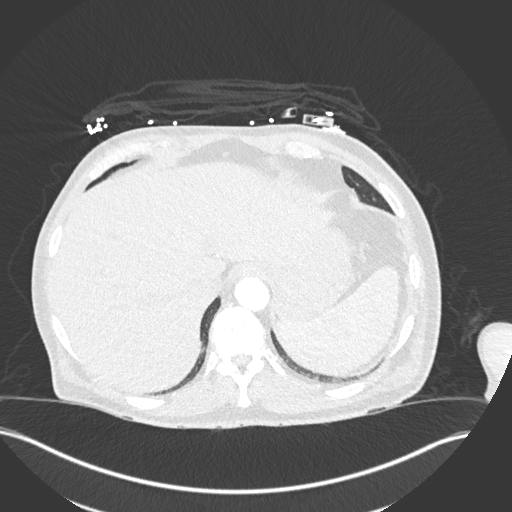
[im 124/357  soft-tissue]
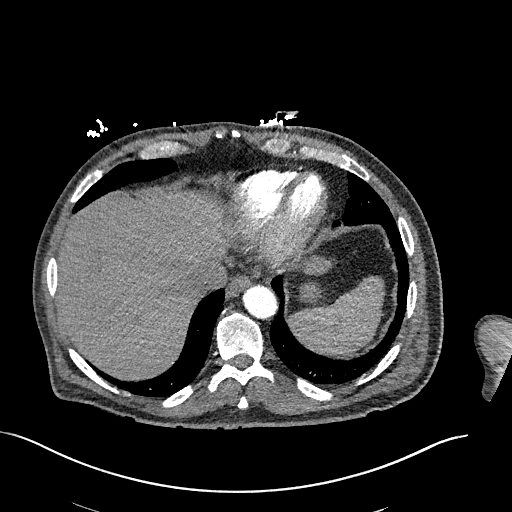
[im 140/357  lung]
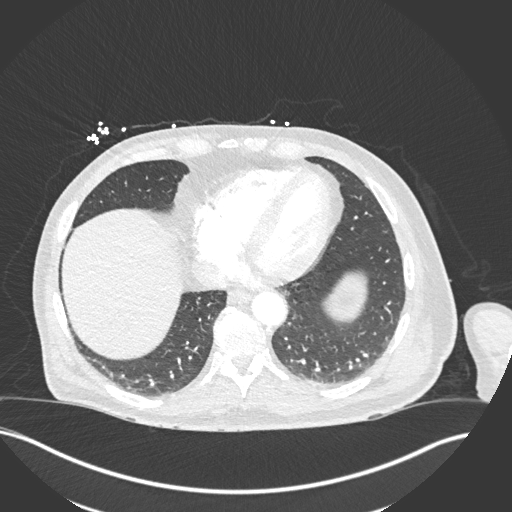
[im 171/357  soft-tissue]
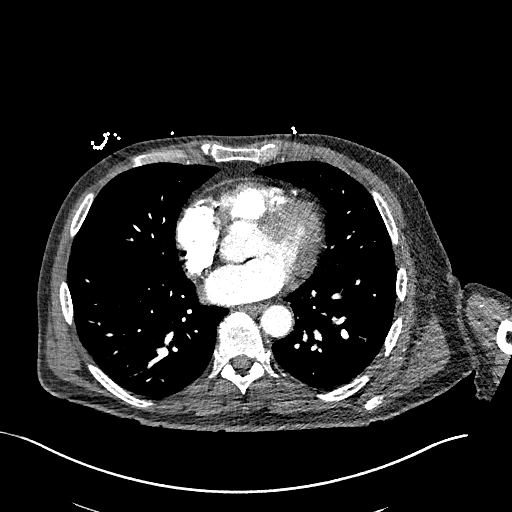
[im 186/357  lung]
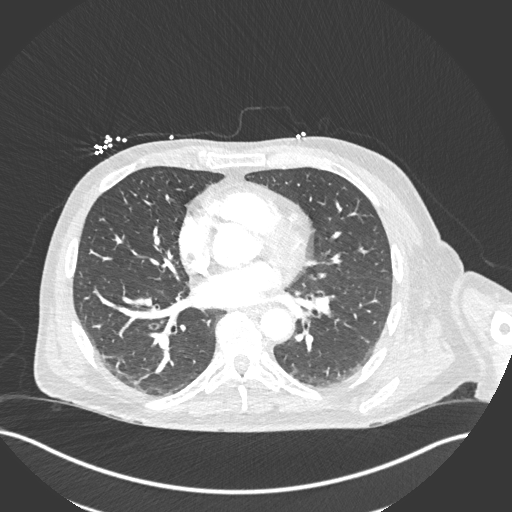
[im 217/357  soft-tissue]
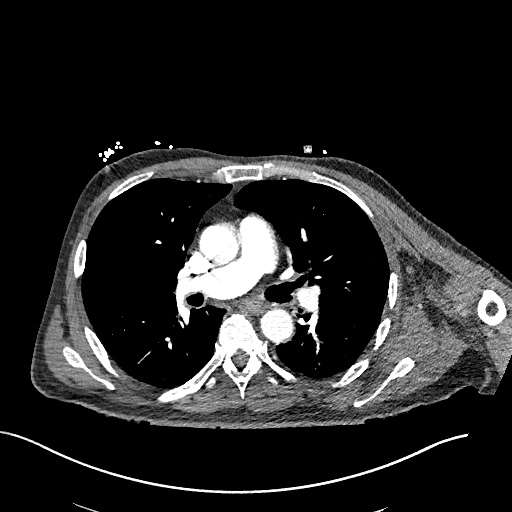
[im 233/357  lung]
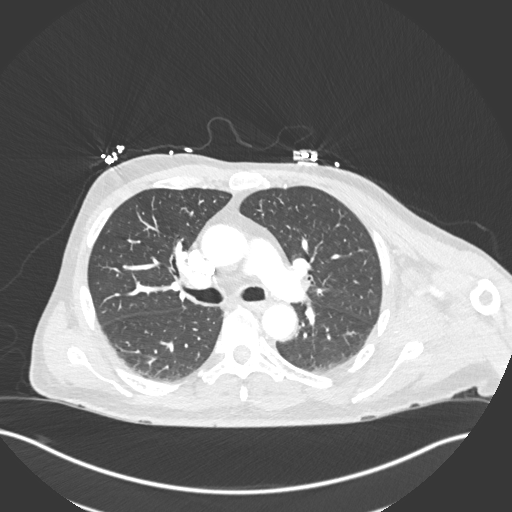
[im 248/357  soft-tissue]
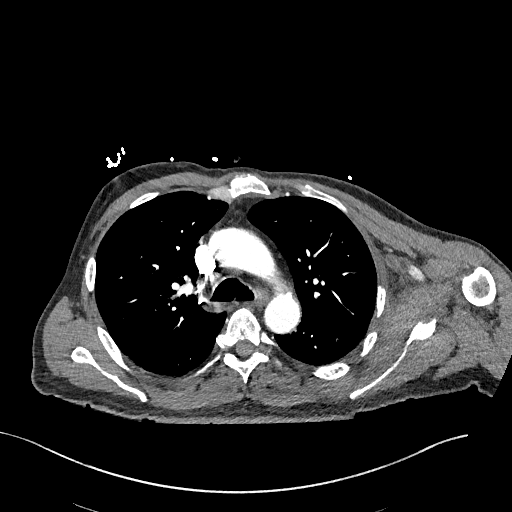
[im 279/357  lung]
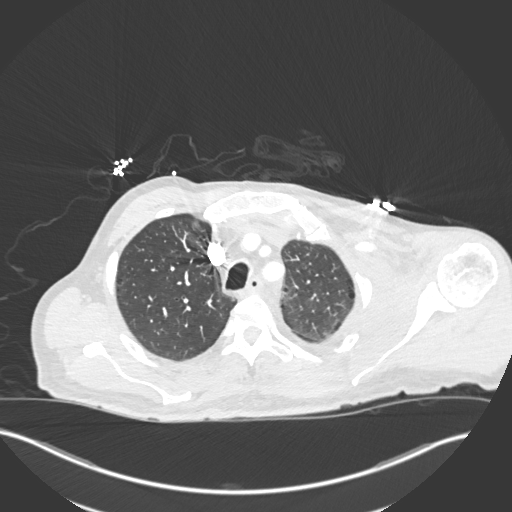
[im 295/357  soft-tissue]
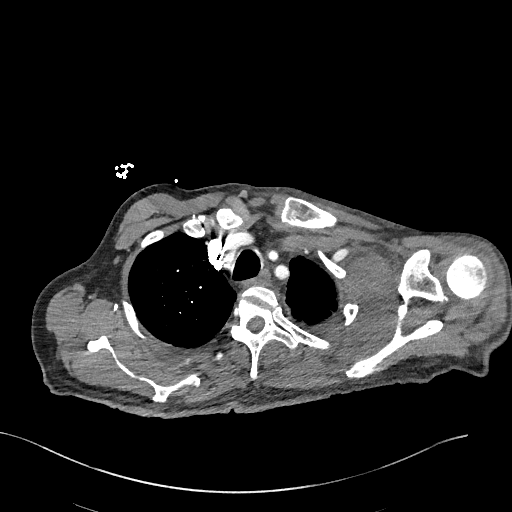
[im 310/357  lung]
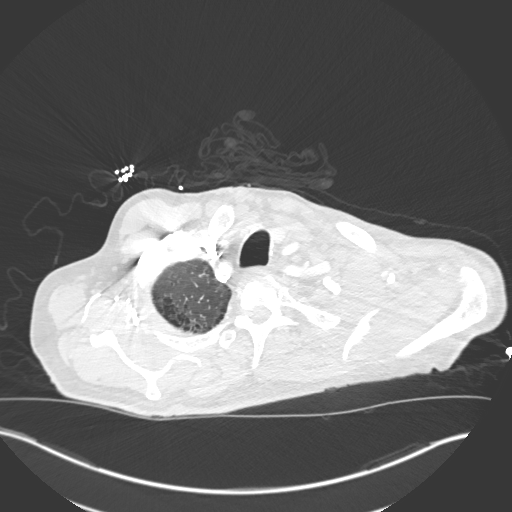
[im 341/357  soft-tissue]
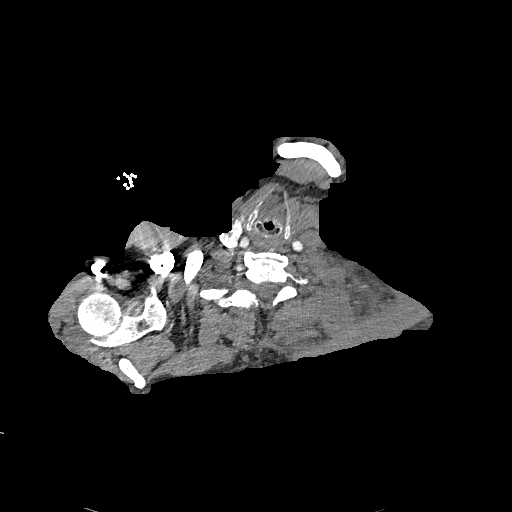

[Series 7: cor soft · coronal · 0.74mm/px · 3 of 135 slices shown]
[im 34/135  soft-tissue]
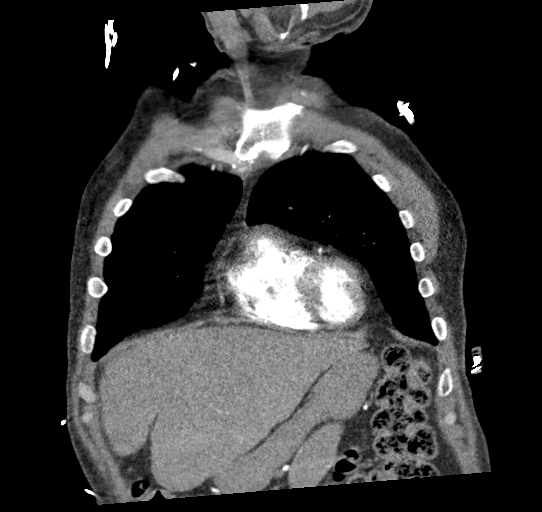
[im 68/135  soft-tissue]
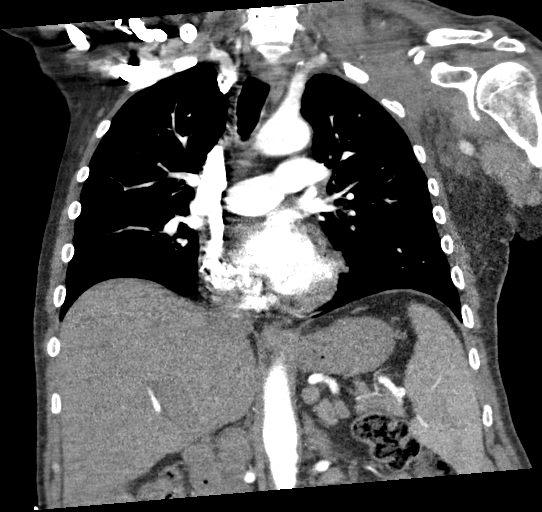
[im 101/135  soft-tissue]
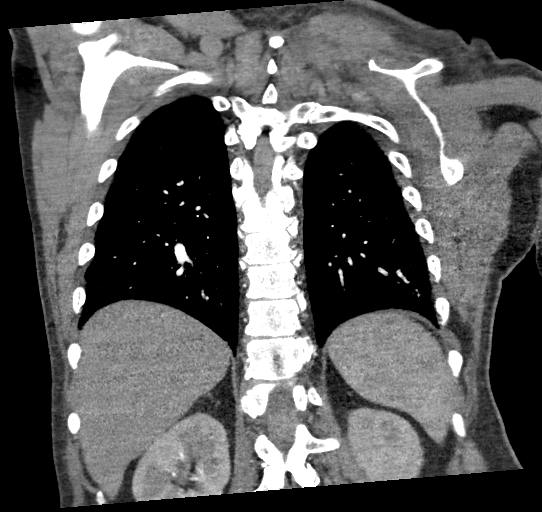

[19 of 46 positions shown; findings below may reference images not displayed]

FINDINGS: Cardiovascular: The pulmonary arterial tree is adequately opacified.
There is no intraluminal filling defect to suggest acute pulmonary
embolism. The central pulmonary arteries are of normal caliber.
There is adequate opacification of the thoracic aorta. The
left-sided aorta is of normal caliber without evidence of intramural
hematoma or dissection. Arch vasculature is widely patent
proximally. Moderate coronary calcification within the proximal left
anterior descending coronary artery. Global cardiac size within
normal limits. No pericardial effusion.

Mediastinum/Nodes: No pathologic thoracic adenopathy.

Lungs/Pleura: Lungs are clear. No pneumothorax or pleural effusion.
Minimal paraseptal emphysema within the lung apices.

Upper Abdomen: Limited images the upper abdomen are unremarkable.

Musculoskeletal: Osseous structures are age-appropriate.

Review of the MIP images confirms the above findings.
IMPRESSION: 1. No pulmonary embolism.
2. No aortic dissection.
3. Moderate coronary artery calcification within the proximal LAD.

## 2022-04-11 IMAGING — DX DG SHOULDER 2+V*L*
2 series · 2 of 2 positions shown · non-contrast
Comparison: None.

CLINICAL DATA: Left shoulder pain

EXAM:
LEFT SHOULDER - 2+ VIEW

[shoulder ap]
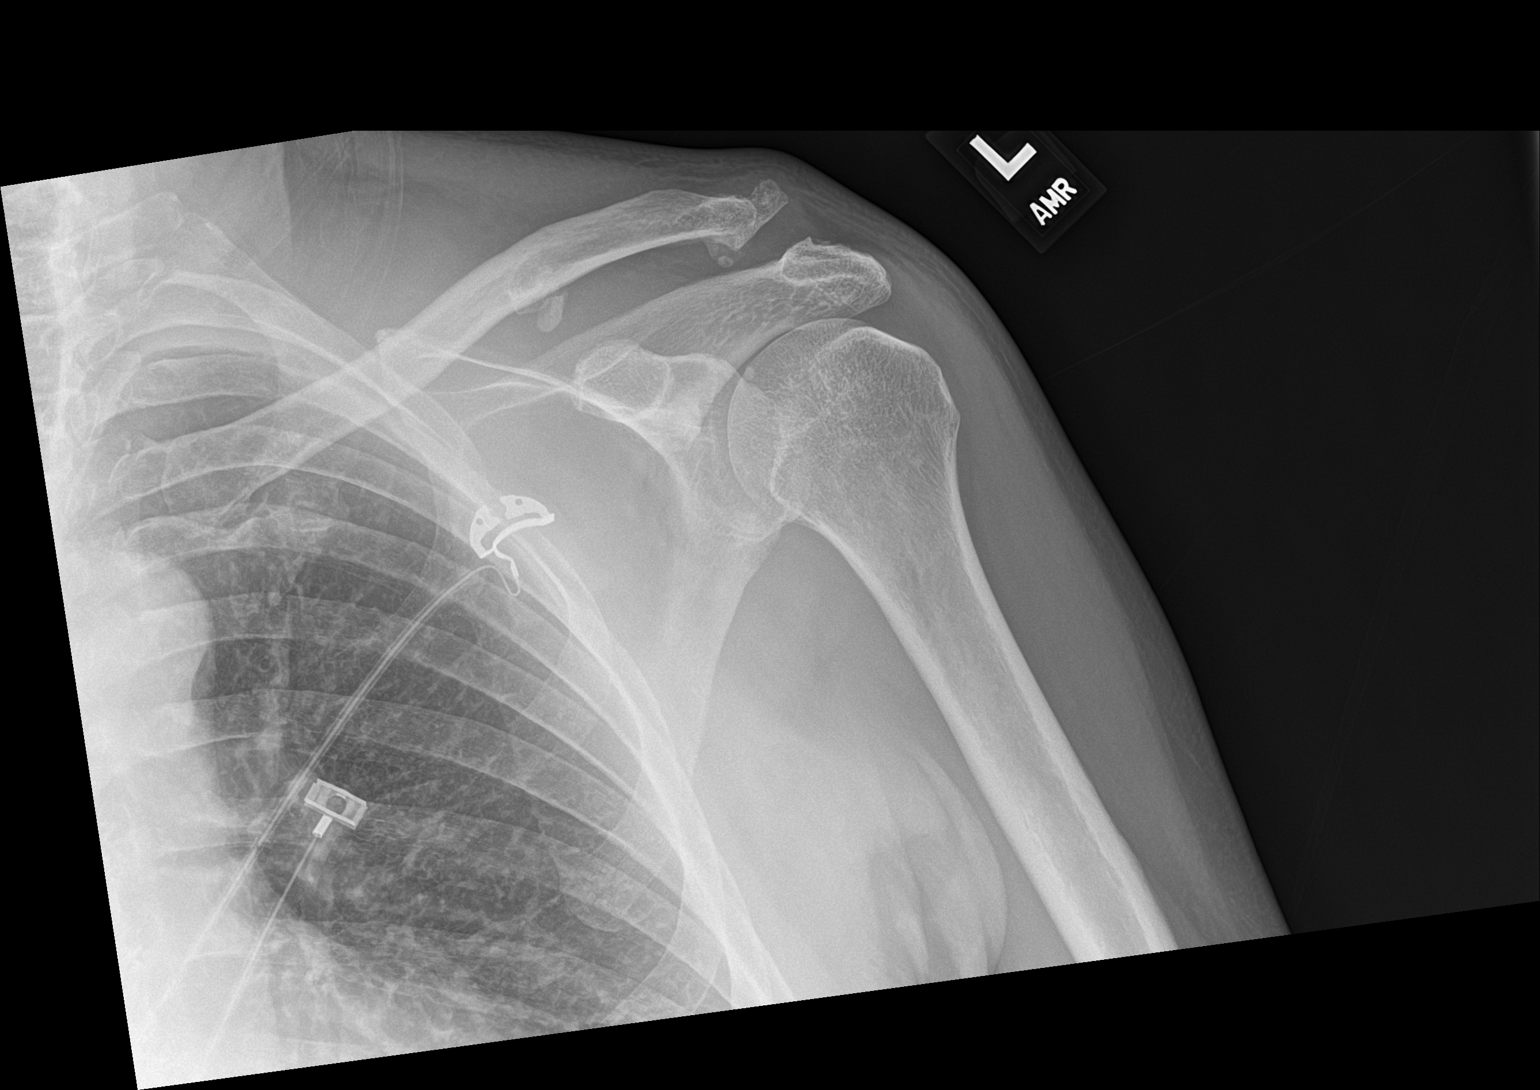

[shoulder obl]
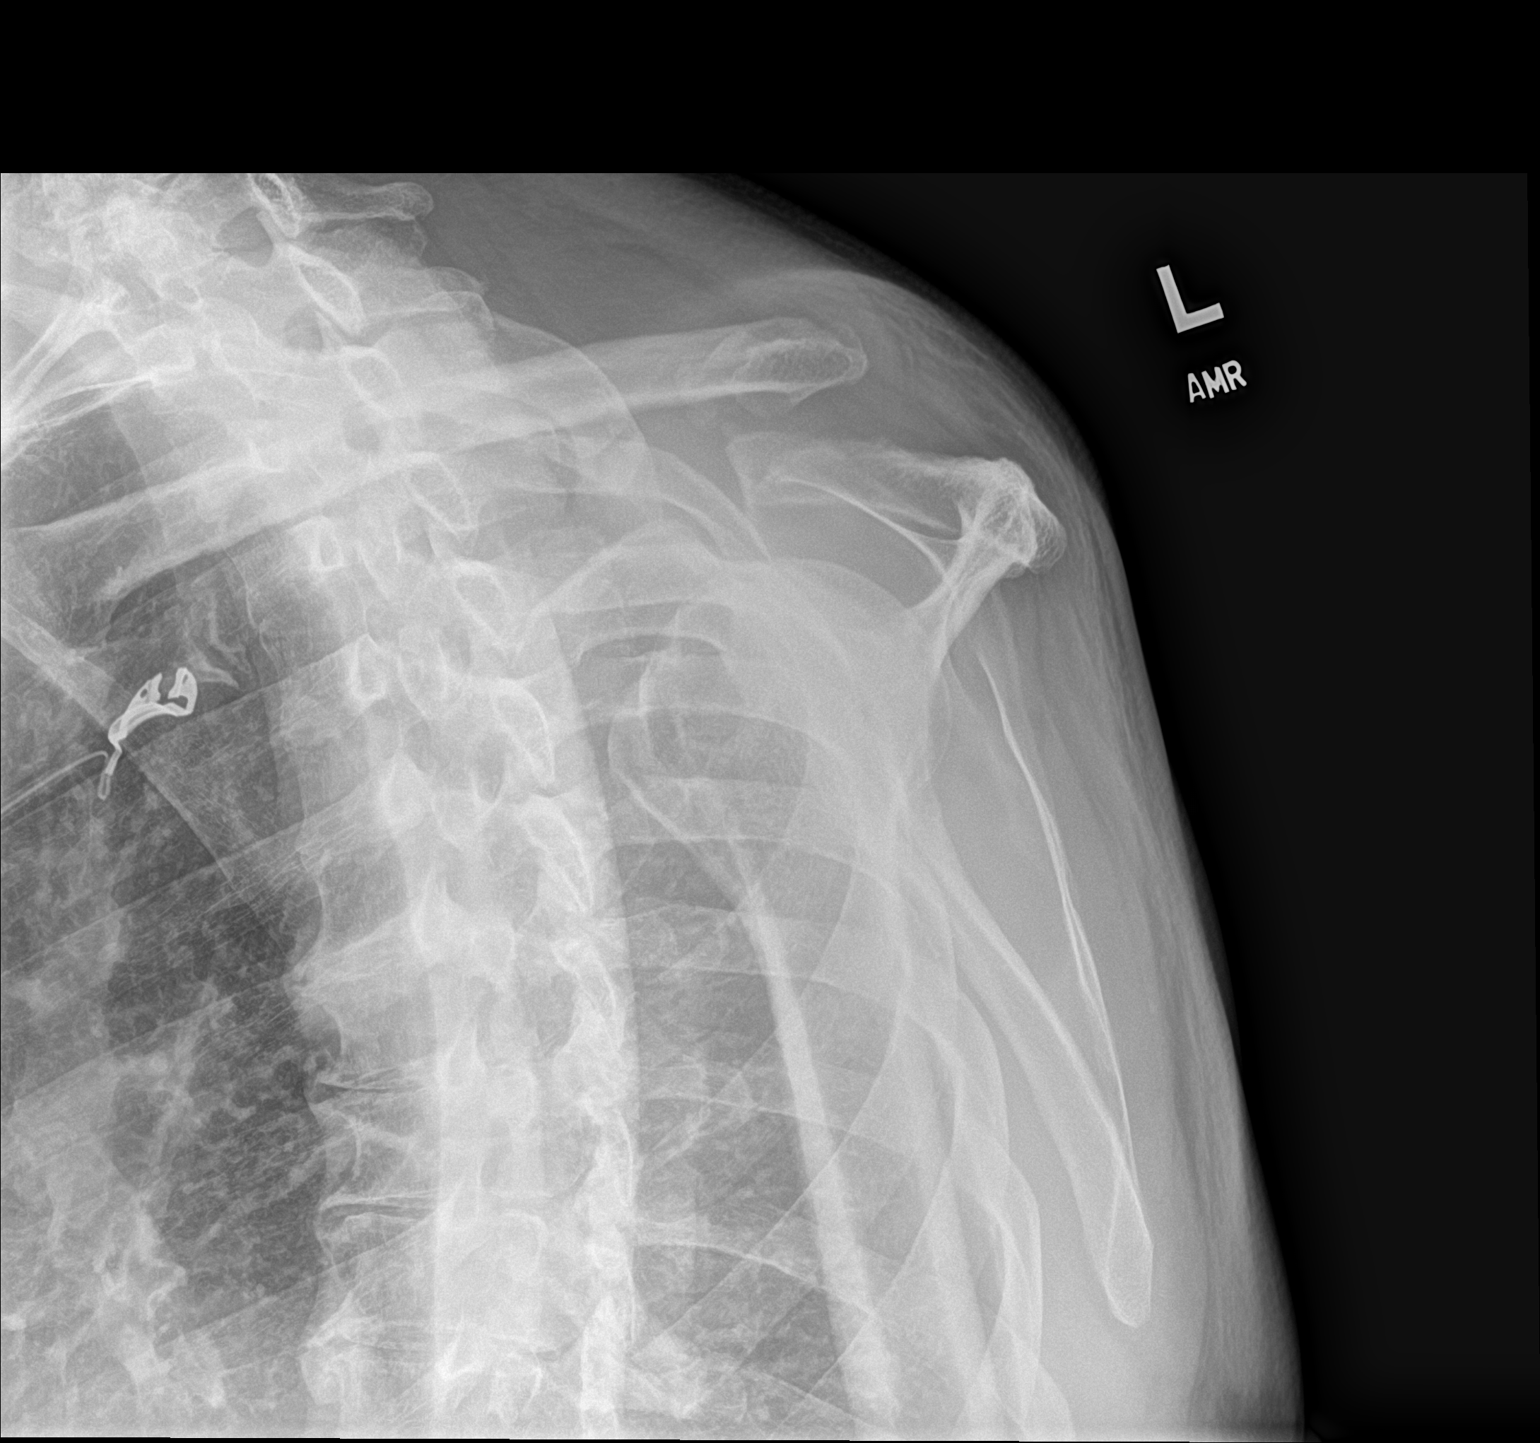

[2 of 2 positions shown; findings below may reference images not displayed]

FINDINGS: Frontal and transscapular Y-view are presented. No acute fracture.
There is widening of the acromioclavicular and coracoclavicular
joint spaces with heterotopic ossification most in keeping with
remote injury/separation. Glenohumeral joint space is not well
profiled. Visualize left hemithorax is unremarkable.
IMPRESSION: Acromioclavicular joint space and coracoclavicular space widening
with associated heterotopic ossification most in keeping with remote
trauma. Superimposed acute on chronic injury would be difficult to
exclude and weighted views may be helpful for further evaluation.

## 2022-10-23 DIAGNOSIS — Z888 Allergy status to other drugs, medicaments and biological substances status: Secondary | ICD-10-CM | POA: Insufficient documentation

## 2023-07-18 DIAGNOSIS — F1910 Other psychoactive substance abuse, uncomplicated: Secondary | ICD-10-CM | POA: Insufficient documentation

## 2024-02-17 ENCOUNTER — Telehealth: Payer: Self-pay

## 2024-02-17 NOTE — Telephone Encounter (Signed)
 New patient appointments are only offered in the morning session

## 2024-02-17 NOTE — Telephone Encounter (Signed)
 Pt is sheduled for 07/29 in the AM. Son has requested an afternoon appt, which at this time I can not schedule. Please advise if okay to schedule an afternoon NP appt.

## 2024-02-18 NOTE — Telephone Encounter (Signed)
 noted

## 2024-03-22 ENCOUNTER — Other Ambulatory Visit: Payer: Self-pay

## 2024-03-22 ENCOUNTER — Encounter (HOSPITAL_COMMUNITY): Payer: Self-pay | Admitting: Emergency Medicine

## 2024-03-22 ENCOUNTER — Emergency Department (HOSPITAL_COMMUNITY)
Admission: EM | Admit: 2024-03-22 | Discharge: 2024-03-22 | Disposition: A | Discharge status: Home / Self Care | Attending: Emergency Medicine | Admitting: Emergency Medicine

## 2024-03-22 ENCOUNTER — Emergency Department (HOSPITAL_COMMUNITY)

## 2024-03-22 DIAGNOSIS — Z794 Long term (current) use of insulin: Secondary | ICD-10-CM | POA: Insufficient documentation

## 2024-03-22 DIAGNOSIS — Z7984 Long term (current) use of oral hypoglycemic drugs: Secondary | ICD-10-CM | POA: Diagnosis not present

## 2024-03-22 DIAGNOSIS — W19XXXA Unspecified fall, initial encounter: Secondary | ICD-10-CM

## 2024-03-22 DIAGNOSIS — I10 Essential (primary) hypertension: Secondary | ICD-10-CM | POA: Diagnosis not present

## 2024-03-22 DIAGNOSIS — W1843XA Slipping, tripping and stumbling without falling due to stepping from one level to another, initial encounter: Secondary | ICD-10-CM | POA: Diagnosis not present

## 2024-03-22 DIAGNOSIS — S0990XA Unspecified injury of head, initial encounter: Secondary | ICD-10-CM | POA: Insufficient documentation

## 2024-03-22 DIAGNOSIS — D649 Anemia, unspecified: Secondary | ICD-10-CM | POA: Diagnosis not present

## 2024-03-22 DIAGNOSIS — S51811A Laceration without foreign body of right forearm, initial encounter: Secondary | ICD-10-CM | POA: Diagnosis not present

## 2024-03-22 DIAGNOSIS — E119 Type 2 diabetes mellitus without complications: Secondary | ICD-10-CM | POA: Insufficient documentation

## 2024-03-22 DIAGNOSIS — Z7982 Long term (current) use of aspirin: Secondary | ICD-10-CM | POA: Insufficient documentation

## 2024-03-22 DIAGNOSIS — Z79899 Other long term (current) drug therapy: Secondary | ICD-10-CM | POA: Insufficient documentation

## 2024-03-22 DIAGNOSIS — J449 Chronic obstructive pulmonary disease, unspecified: Secondary | ICD-10-CM | POA: Diagnosis not present

## 2024-03-22 DIAGNOSIS — S01111A Laceration without foreign body of right eyelid and periocular area, initial encounter: Secondary | ICD-10-CM | POA: Insufficient documentation

## 2024-03-22 LAB — BLOOD GAS, VENOUS
Acid-Base Excess: 5.3 mmol/L — ABNORMAL HIGH (ref 0.0–2.0)
Bicarbonate: 31.4 mmol/L — ABNORMAL HIGH (ref 20.0–28.0)
Drawn by: 58919
O2 Saturation: 44.6 %
Patient temperature: 36.6
pCO2, Ven: 52 mmHg (ref 44–60)
pH, Ven: 7.39 (ref 7.25–7.43)
pO2, Ven: <31 mmHg — CL (ref 32–45)

## 2024-03-22 LAB — CBC
HCT: 28.1 % — ABNORMAL LOW (ref 39.0–52.0)
Hemoglobin: 8.6 g/dL — ABNORMAL LOW (ref 13.0–17.0)
MCH: 24.2 pg — ABNORMAL LOW (ref 26.0–34.0)
MCHC: 30.6 g/dL (ref 30.0–36.0)
MCV: 79.2 fL — ABNORMAL LOW (ref 80.0–100.0)
Platelets: 94 K/uL — ABNORMAL LOW (ref 150–400)
RBC: 3.55 MIL/uL — ABNORMAL LOW (ref 4.22–5.81)
RDW: 17.6 % — ABNORMAL HIGH (ref 11.5–15.5)
WBC: 3.2 K/uL — ABNORMAL LOW (ref 4.0–10.5)
nRBC: 0 % (ref 0.0–0.2)

## 2024-03-22 LAB — CBC WITH DIFFERENTIAL/PLATELET
Abs Immature Granulocytes: 0 K/uL (ref 0.00–0.07)
Basophils Absolute: 0 K/uL (ref 0.0–0.1)
Basophils Relative: 0 %
Eosinophils Absolute: 0.2 K/uL (ref 0.0–0.5)
Eosinophils Relative: 7 %
HCT: 28.8 % — ABNORMAL LOW (ref 39.0–52.0)
Hemoglobin: 8.8 g/dL — ABNORMAL LOW (ref 13.0–17.0)
Immature Granulocytes: 0 %
Lymphocytes Relative: 27 %
Lymphs Abs: 0.8 K/uL (ref 0.7–4.0)
MCH: 24.1 pg — ABNORMAL LOW (ref 26.0–34.0)
MCHC: 30.6 g/dL (ref 30.0–36.0)
MCV: 78.9 fL — ABNORMAL LOW (ref 80.0–100.0)
Monocytes Absolute: 0.2 K/uL (ref 0.1–1.0)
Monocytes Relative: 8 %
Neutro Abs: 1.6 K/uL — ABNORMAL LOW (ref 1.7–7.7)
Neutrophils Relative %: 58 %
Platelets: 98 K/uL — ABNORMAL LOW (ref 150–400)
RBC: 3.65 MIL/uL — ABNORMAL LOW (ref 4.22–5.81)
RDW: 17.5 % — ABNORMAL HIGH (ref 11.5–15.5)
WBC: 2.9 K/uL — ABNORMAL LOW (ref 4.0–10.5)
nRBC: 0 % (ref 0.0–0.2)

## 2024-03-22 LAB — BASIC METABOLIC PANEL WITH GFR
Anion gap: 11 (ref 5–15)
BUN: 18 mg/dL (ref 8–23)
CO2: 28 mmol/L (ref 22–32)
Calcium: 9.2 mg/dL (ref 8.9–10.3)
Chloride: 99 mmol/L (ref 98–111)
Creatinine, Ser: 0.72 mg/dL (ref 0.61–1.24)
GFR, Estimated: >60 mL/min (ref >60)
Glucose, Bld: 367 mg/dL — ABNORMAL HIGH (ref 70–99)
Potassium: 3.8 mmol/L (ref 3.5–5.1)
Sodium: 138 mmol/L (ref 135–145)

## 2024-03-22 LAB — BETA-HYDROXYBUTYRIC ACID: Beta-Hydroxybutyric Acid: 0.1 mmol/L (ref 0.05–0.27)

## 2024-03-22 LAB — CBG MONITORING, ED
Glucose-Capillary: 394 mg/dL — ABNORMAL HIGH (ref 70–99)
Glucose-Capillary: 250 mg/dL — ABNORMAL HIGH (ref 70–99)

## 2024-03-22 MED ORDER — LIDOCAINE-EPINEPHRINE-TETRACAINE (LET) TOPICAL GEL
3.0000 mL | Freq: Once | TOPICAL | Status: AC
  Administered 2024-03-22: 3 mL via TOPICAL
  Filled 2024-03-22: qty 3

## 2024-03-22 MED ORDER — INSULIN ASPART 100 UNIT/ML IJ SOLN
10.0000 [IU] | Freq: Once | INTRAMUSCULAR | Status: DC
  Filled 2024-03-22: qty 1

## 2024-03-22 MED ORDER — LACTATED RINGERS IV BOLUS
1000.0000 mL | Freq: Once | INTRAVENOUS | Status: AC
  Administered 2024-03-22: 1000 mL via INTRAVENOUS

## 2024-03-22 NOTE — ED Notes (Signed)
 Patient transported to CT

## 2024-03-22 NOTE — ED Triage Notes (Signed)
 Pt bib [facility] for a fall. Pt tripped in his yard and fell to his head. Pt has a lac to right eye brow. Skin tear to right arm. Denies LOC, no blood thinners. Pts cbg was 504 w/ ems. Pt took 10U of insulin  before coming to the ED. Also had 1L NS.

## 2024-03-22 NOTE — ED Provider Notes (Signed)
 [facility]ERGENCY DEPARTMENT AT [facility] Provider Note   CSN: [id number] Arrival date & time: 03/22/24  1729     Patient presents with: Jacob Bautista is a 63 y.o. male.    Fall     63 year old male with medical history significant for chronic hepatitis C, bipolar disorder, HLD, GERD, diabetes mellitus, HTN, COPD presenting to the emergency department after a fall.  The patient states that he tripped over a dog leash and fell forward sustaining a small 1 cm laceration to his right eyebrow.  He also sustained a skin tear to the right arm.  He denies any loss of consciousness.  He is not on anticoagulation.  He is a diabetic and states that his CBG was 504 with EMS and took 10 units of insulin  prior to coming to the emergency department.  EMS administered 1 L of normal saline.  The patient denies any other injuries or complaints.  He states that his tetanus is up-to-date as of 2017.  He arrives GCS 15, ABC intact.  Prior to Admission medications   Medication Sig Start Date End Date Taking? Authorizing Provider  Accu-Chek FastClix Lancets MISC [facility] GLUCOSE TWICE DAILY AS NEEDED 02/04/20   Dettinger, Fonda LABOR, MD  albuterol  (VENTOLIN  HFA) 108 (90 Base) MCG/ACT inhaler USE 1-2 PUFFS EVERY 4 HOURS AS NEEDED FOR WHEEZING OR SHORTNESS OF BREATH 05/16/20   Dettinger, Fonda LABOR, MD  albuterol  (VENTOLIN  HFA) 108 (90 Base) MCG/ACT inhaler INHALE 1 TO 2 PUFFS BY MOUTH EVERY 4 HOURS AS NEEDED FOR WHEEZING OR SHORTNESS OF BREATH 07/14/20   Dettinger, Fonda LABOR, MD  amoxicillin  (AMOXIL ) 500 MG capsule Take 1 capsule (500 mg total) by mouth 2 (two) times daily. 07/27/20   Dettinger, Fonda LABOR, MD  aspirin  EC 81 MG tablet Take 81 mg by mouth daily.    [provider]  doxycycline  (VIBRAMYCIN ) 100 MG capsule Take 1 capsule (100 mg total) by mouth 2 (two) times daily. Patient not taking: Reported on 01/02/2022 07/14/21   Sofia, Leslie K, PA-C  DULoxetine  (CYMBALTA ) 30 MG capsule Take  1 capsule by mouth daily. Patient not taking: Reported on 01/02/2022 05/03/21   [provider]  DULoxetine  (CYMBALTA ) 60 MG capsule TAKE 2 CAPSULES BY MOUTH EVERY DAY Patient not taking: Reported on 01/02/2022 05/29/20   Dettinger, Fonda LABOR, MD  gabapentin  (NEURONTIN ) 300 MG capsule TAKE 1 CAPSULE(300 MG) BY MOUTH TWICE DAILY Patient not taking: Reported on 01/02/2022 07/30/19   Dettinger, Fonda LABOR, MD  gemfibrozil  (LOPID ) 600 MG tablet TAKE 1 TABLET(600 MG) BY MOUTH TWICE DAILY BEFORE A MEAL Patient not taking: Reported on 01/02/2022 04/03/20   Dettinger, Fonda LABOR, MD  glucose blood (ACCU-CHEK GUIDE) test strip USE TO TEST GLUCOSE TWICE DAILY AND AS NEEDED 02/08/20   Dettinger, Fonda LABOR, MD  insulin  glargine (LANTUS ) 100 UNIT/ML injection ADMINISTER 50 UNITS UNDER THE SKIN AT BEDTIME 07/14/20   Dettinger, Fonda LABOR, MD  insulin  regular (HUMULIN  R) 100 units/mL injection ADMINISTER 6 TO 10 UNITS UNDER THE SKIN TWICE DAILY BEFORE A MEAL PER SLIDING SCALE 08/30/20   Dettinger, Fonda LABOR, MD  Insulin  Syringe-Needle U-100 (B-D INS SYR ULTRAFINE 1CC/30G) 30G X 1/2 1 ML MISC Use up to 4 syringes daily as needed Dx E11.40 05/16/20   Dettinger, Fonda LABOR, MD  JARDIANCE  10 MG TABS tablet TAKE 1 TABLET BY MOUTH DAILY 07/03/20   Dettinger, Fonda LABOR, MD  levothyroxine  (SYNTHROID ) 50 MCG tablet TAKE 1 TABLET BY MOUTH  EVERY DAY BEFORE BREAKFAST Patient taking differently: Take 50 mcg by mouth daily before breakfast. 07/03/20   Dettinger, Fonda LABOR, MD  lidocaine  (LIDODERM ) 5 % Place 1 patch onto the skin daily. Remove & Discard patch within 12 hours or as directed by MD 03/10/20   Idol, Julie, PA-C  lisinopril  (ZESTRIL ) 5 MG tablet TAKE 1 TABLET(5 MG) BY MOUTH DAILY Patient taking differently: Take 5 mg by mouth daily. 07/03/20   Dettinger, Fonda LABOR, MD  metFORMIN  (GLUCOPHAGE ) 1000 MG tablet TAKE 1 TABLET(1000 MG) BY MOUTH TWICE DAILY WITH A MEAL Patient taking differently: Take 1,000 mg by mouth 2 (two) times daily  with a meal. 07/03/20   Dettinger, Fonda LABOR, MD  methocarbamol  (ROBAXIN ) 500 MG tablet Take 1 tablet (500 mg total) by mouth 2 (two) times daily. Patient not taking: Reported on 01/02/2022 03/10/20   Idol, Julie, PA-C  mirtazapine  (REMERON ) 45 MG tablet TAKE 1 TABLET(45 MG) BY MOUTH AT BEDTIME Patient taking differently: Take 45 mg by mouth at bedtime. 07/03/20   Dettinger, Fonda LABOR, MD  pantoprazole  (PROTONIX ) 40 MG tablet TAKE 1 TABLET BY MOUTH TWICE DAILY BEFORE A MEAL 12/28/20   Marvis Camellia LABOR, NP  rosuvastatin (CRESTOR) 5 MG tablet Take 5 mg by mouth daily. 11/12/21   [provider]  tadalafil (CIALIS) 10 MG tablet Take 10 mg by mouth daily as needed for erectile dysfunction. 08/10/21   [provider]  tamsulosin  (FLOMAX ) 0.4 MG CAPS capsule TAKE 1 CAPSULE(0.4 MG) BY MOUTH DAILY Patient taking differently: Take 0.4 mg by mouth daily. 07/03/20   Dettinger, Fonda LABOR, MD    Allergies: Patient has no known allergies.    Review of Systems  All other systems reviewed and are negative.   Updated Vital Signs BP 134/74   Pulse 64   Temp 97.8 F (36.6 C)   Resp 13   Ht 6' (1.829 m)   Wt 65.8 kg   SpO2 100%   BMI 19.67 kg/m   Physical Exam Vitals and nursing note reviewed.  Constitutional:      Appearance: He is well-developed.     Comments: GCS 15, ABC intact  HENT:     Head: Normocephalic.     Comments: Small 1 cm laceration to the right eyebrow, hemostatic, hematoma to the right forehead Eyes:     Conjunctiva/sclera: Conjunctivae normal.  Neck:     Comments: No midline tenderness to palpation of the cervical spine. ROM intact. Cardiovascular:     Rate and Rhythm: Normal rate and regular rhythm.     Heart sounds: No murmur heard. Pulmonary:     Effort: Pulmonary effort is normal. No respiratory distress.     Breath sounds: Normal breath sounds.  Chest:     Comments: Chest wall stable and non-tender to AP and lateral compression. Clavicles stable and non-tender  to AP compression Abdominal:     Palpations: Abdomen is soft.     Tenderness: There is no abdominal tenderness.     Comments: Pelvis stable to lateral compression.  Musculoskeletal:     Cervical back: Neck supple.     Comments: No midline tenderness to palpation of the thoracic or lumbar spine. Extremities atraumatic with intact ROM with the exception of a hemostatic skin tear to the right forearm  Skin:    General: Skin is warm and dry.  Neurological:     Mental Status: He is alert.     Comments: CN II-XII grossly intact. Moving all four extremities spontaneously  and sensation grossly intact.     (all labs ordered are listed, but only abnormal results are displayed) Labs Reviewed  BASIC METABOLIC PANEL WITH GFR - Abnormal; Notable for the following components:      Result Value   Glucose, Bld 367 (*)    All other components within normal limits  CBC WITH DIFFERENTIAL/PLATELET - Abnormal; Notable for the following components:   WBC 2.9 (*)    RBC 3.65 (*)    Hemoglobin 8.8 (*)    HCT 28.8 (*)    MCV 78.9 (*)    MCH 24.1 (*)    RDW 17.5 (*)    Platelets 98 (*)    Neutro Abs 1.6 (*)    All other components within normal limits  BLOOD GAS, VENOUS - Abnormal; Notable for the following components:   pO2, Ven <31 (*)    Bicarbonate 31.4 (*)    Acid-Base Excess 5.3 (*)    All other components within normal limits  CBC - Abnormal; Notable for the following components:   WBC 3.2 (*)    RBC 3.55 (*)    Hemoglobin 8.6 (*)    HCT 28.1 (*)    MCV 79.2 (*)    MCH 24.2 (*)    RDW 17.6 (*)    Platelets 94 (*)    All other components within normal limits  CBG MONITORING, ED - Abnormal; Notable for the following components:   Glucose-Capillary 394 (*)    All other components within normal limits  CBG MONITORING, ED - Abnormal; Notable for the following components:   Glucose-Capillary 250 (*)    All other components within normal limits  BETA-HYDROXYBUTYRIC ACID     EKG: None  Radiology: CT Head Wo Contrast Result Date: 03/22/2024 CLINICAL DATA:  Head trauma, moderate-severe Pt bib rcems for a fall. Pt tripped in his yard and fell to his head. Pt has a lac to right eye brow. Skin tear to right arm. Denies LOC, no blood thinners. Pts cbg was 504 w/ ems. Pt took 10U of insulin  before coming to the ED. Also had 1L NS EXAM: CT HEAD WITHOUT CONTRAST TECHNIQUE: Contiguous axial images were obtained from the base of the skull through the vertex without intravenous contrast. RADIATION DOSE REDUCTION: This exam was performed according to the departmental dose-optimization program which includes automated exposure control, adjustment of the mA and/or kV according to patient size and/or use of iterative reconstruction technique. COMPARISON:  CT head 10/14/2016. FINDINGS: Brain: No evidence of large-territorial acute infarction. No parenchymal hemorrhage. No mass lesion. No extra-axial collection. No mass effect or midline shift. No hydrocephalus. Basilar cisterns are patent. Vascular: No hyperdense vessel. Atherosclerotic calcifications are present within the cavernous internal carotid arteries. Skull: No acute fracture or focal lesion. Surgical hardware along the anterior wall of the right maxillary sinus. Sinuses/Orbits: Paranasal sinuses and mastoid air cells are clear. Left lens replacement. Otherwise the orbits are unremarkable. Other: None. IMPRESSION: No acute intracranial abnormality. Electronically Signed   By: Morgane  Naveau M.D.   On: 03/22/2024 18:21     .Laceration Repair  Date/Time: 03/22/2024 7:03 PM  Performed by: Jerrol Agent, MD Authorized by: Jerrol Agent, MD   Consent:    Consent obtained:  Verbal   Consent given by:  Patient   Risks discussed:  Infection and pain Universal protocol:    Patient identity confirmed:  Verbally with patient Anesthesia:    Anesthesia method:  Topical application   Topical anesthetic:  LET Laceration  details:  Location:  Face   Face location:  R eyebrow   Length (cm):  0.5 Skin repair:    Repair method:  Sutures   Suture size:  5-0   Wound skin closure material used: Vicryl Rapide.   Suture technique:  Simple interrupted   Number of sutures:  1 Approximation:    Approximation:  Close Repair type:    Repair type:  Simple Post-procedure details:    Dressing:  Open (no dressing)   Procedure completion:  Tolerated    Medications Ordered in the ED  lactated ringers  bolus 1,000 mL (0 mLs Intravenous Stopped 03/22/24 1940)  lidocaine -EPINEPHrine -tetracaine  (LET) topical gel (3 mLs Topical Given 03/22/24 1757)                                    Medical Decision Making Amount and/or Complexity of Data Reviewed Labs: ordered. Radiology: ordered.  Risk Prescription drug management.    63 year old male with medical history significant for chronic hepatitis C, bipolar disorder, HLD, GERD, diabetes mellitus, HTN, COPD presenting to the emergency department after a fall.  The patient states that he tripped over a dog leash and fell forward sustaining a small 1 cm laceration to his right eyebrow.  He also sustained a skin tear to the right arm.  He denies any loss of consciousness.  He is not on anticoagulation.  He is a diabetic and states that his CBG was 504 with EMS and took 10 units of insulin  prior to coming to the emergency department.  EMS administered 1 L of normal saline.  The patient denies any other injuries or complaints.  He states that his tetanus is up-to-date as of 2017.  He arrives GCS 15, ABC intact.  On arrival, the patient was afebrile, vitally stable.  Physical exam as per above concerning for small laceration to the right eyebrow/forehead, hematoma underlying, skin tear to the right arm, remainder of secondary survey unremarkable for evidence of traumatic injury.  Patient is a diabetic and presents with a blood sugar of 504.  Administer 1 L of normal saline the  patient self administered insulin  prior to arrival.  Initial CBG on arrival still elevated 394.  Will initiate DKA workup.  Will obtain CT imaging of the head as the patient endorses a 8 out of 10 severe headache.  CT Head: Negative.  Patient laceration repaired per the procedure note above.  Labs: Blood gas with a pH of 7.39, CBC with apparent pancytopenia, will redraw, BMP with hyperglycemia without DKA with a bicarb of 28, anion gap 11.  Patient administered a 1 L LR bolus, 10 units of subcutaneous insulin .  Blood glucoses were appropriately downtrending on repeat evaluation.  Repeat CBC showed a persistent pancytopenia, hemoglobin 8.6 down from around 10 last year.  Blood glucose is appropriately downtrending at 250 after fluid resuscitation.  Patient denies any active melena or hematochezia.  Has occasional bright red blood per rectum associated with known hemorrhoids but not actively a concern at this time.  I recommended close follow-up with his primary care provider for recheck of his hemoglobin.       Final diagnoses:  Fall, initial encounter  Minor head injury, initial encounter  Skin tear of right forearm without complication, initial encounter  Anemia, unspecified type    ED Discharge Orders     None          Jerrol Agent, MD 03/22/24 2004

## 2024-03-22 NOTE — Discharge Instructions (Addendum)
 Your laceration of the eyebrow was closed with a single observable suture which will fall out and absorb into the tissue over time.  He sustained a hematoma that was underlying, recommend you ice this area for the first 24 hours, recommend Tylenol  and Motrin for pain control.  Your blood sugar was elevated but you are not diabetic ketoacidosis and your blood glucose was appropriately downtrending after fluids and insulin .  Please follow-up with your PCP for recheck of your hemoglobin, return for any dark sticky stools or significant bright red blood per rectum.

## 2024-03-29 MED FILL — Lactated Ringer's Solution: INTRAVENOUS | Qty: 1000 | Status: AC

## 2024-04-06 ENCOUNTER — Encounter: Payer: Self-pay | Admitting: Family Medicine

## 2024-04-06 ENCOUNTER — Ambulatory Visit (INDEPENDENT_AMBULATORY_CARE_PROVIDER_SITE_OTHER): Admitting: Family Medicine

## 2024-04-06 DIAGNOSIS — Z91199 Patient's noncompliance with other medical treatment and regimen due to unspecified reason: Secondary | ICD-10-CM

## 2024-04-06 DIAGNOSIS — Z7689 Persons encountering health services in other specified circumstances: Secondary | ICD-10-CM

## 2024-04-06 NOTE — Progress Notes (Signed)
     No show new patient establishment appt. Please do not schedule patient with this provider moving forward, due to no show est appt.
